# Patient Record
Sex: Female | Born: 1960 | Race: Black or African American | Hispanic: No | Marital: Single | Smoking: Former smoker
Health system: Southern US, Community
[De-identification: ages and names within clinical notes are randomized; demographics above are authoritative.]

## PROBLEM LIST (undated history)

## (undated) DIAGNOSIS — I639 Cerebral infarction, unspecified: Secondary | ICD-10-CM

## (undated) DIAGNOSIS — E559 Vitamin D deficiency, unspecified: Secondary | ICD-10-CM

## (undated) DIAGNOSIS — R600 Localized edema: Secondary | ICD-10-CM

## (undated) DIAGNOSIS — I10 Essential (primary) hypertension: Secondary | ICD-10-CM

## (undated) DIAGNOSIS — N189 Chronic kidney disease, unspecified: Secondary | ICD-10-CM

## (undated) DIAGNOSIS — H547 Unspecified visual loss: Secondary | ICD-10-CM

## (undated) HISTORY — DX: Unspecified visual loss: H54.7

## (undated) HISTORY — PX: EYE SURGERY: SHX253

## (undated) HISTORY — DX: Vitamin D deficiency, unspecified: E55.9

## (undated) HISTORY — PX: TUBAL LIGATION: SHX77

## (undated) HISTORY — DX: Localized edema: R60.0

## (undated) HISTORY — PX: CHOLECYSTECTOMY: SHX55

## (undated) HISTORY — DX: Cerebral infarction, unspecified: I63.9

## (undated) HISTORY — DX: Essential (primary) hypertension: I10

## (undated) HISTORY — DX: Chronic kidney disease, unspecified: N18.9

---

## 2015-12-26 ENCOUNTER — Inpatient Hospital Stay (HOSPITAL_COMMUNITY)
Admission: RE | Admit: 2015-12-26 | Discharge: 2015-12-28 | DRG: 305 | Disposition: A | Discharge status: Home / Self Care | Payer: PRIVATE HEALTH INSURANCE | Attending: Internal Medicine | Admitting: Internal Medicine

## 2015-12-26 ENCOUNTER — Inpatient Hospital Stay (HOSPITAL_COMMUNITY)
Admission: AD | Admit: 2015-12-26 | Payer: Self-pay | Source: Other Acute Inpatient Hospital | Admitting: Internal Medicine

## 2015-12-26 DIAGNOSIS — H269 Unspecified cataract: Secondary | ICD-10-CM | POA: Diagnosis present

## 2015-12-26 DIAGNOSIS — Z9114 Patient's other noncompliance with medication regimen: Secondary | ICD-10-CM

## 2015-12-26 DIAGNOSIS — I161 Hypertensive emergency: Principal | ICD-10-CM | POA: Diagnosis present

## 2015-12-26 DIAGNOSIS — H348192 Central retinal vein occlusion, unspecified eye, stable: Secondary | ICD-10-CM | POA: Insufficient documentation

## 2015-12-26 DIAGNOSIS — H4089 Other specified glaucoma: Secondary | ICD-10-CM | POA: Diagnosis present

## 2015-12-26 DIAGNOSIS — I10 Essential (primary) hypertension: Secondary | ICD-10-CM | POA: Diagnosis present

## 2015-12-26 DIAGNOSIS — N289 Disorder of kidney and ureter, unspecified: Secondary | ICD-10-CM | POA: Diagnosis present

## 2015-12-26 DIAGNOSIS — Z87891 Personal history of nicotine dependence: Secondary | ICD-10-CM

## 2015-12-26 DIAGNOSIS — N179 Acute kidney failure, unspecified: Secondary | ICD-10-CM | POA: Diagnosis present

## 2015-12-26 DIAGNOSIS — E876 Hypokalemia: Secondary | ICD-10-CM | POA: Diagnosis present

## 2015-12-26 DIAGNOSIS — H348122 Central retinal vein occlusion, left eye, stable: Secondary | ICD-10-CM | POA: Diagnosis present

## 2015-12-26 LAB — CBC
HCT: 37.6 % (ref 36.0–46.0)
Hemoglobin: 12.6 g/dL (ref 12.0–15.0)
MCH: 28.9 pg (ref 26.0–34.0)
MCHC: 33.5 g/dL (ref 30.0–36.0)
MCV: 86.2 fL (ref 78.0–100.0)
Platelets: 242 10*3/uL (ref 150–400)
RBC: 4.36 MIL/uL (ref 3.87–5.11)
RDW: 14.7 % (ref 11.5–15.5)
WBC: 8.4 10*3/uL (ref 4.0–10.5)

## 2015-12-26 LAB — PROTIME-INR
INR: 1.1 (ref 0.00–1.49)
Prothrombin Time: 14.4 seconds (ref 11.6–15.2)

## 2015-12-26 LAB — COMPREHENSIVE METABOLIC PANEL
ALT: 16 U/L (ref 14–54)
AST: 23 U/L (ref 15–41)
Albumin: 3.9 g/dL (ref 3.5–5.0)
Alkaline Phosphatase: 46 U/L (ref 38–126)
Anion gap: 7 (ref 5–15)
BUN: 12 mg/dL (ref 6–20)
CO2: 19 mmol/L — ABNORMAL LOW (ref 22–32)
Calcium: 9.3 mg/dL (ref 8.9–10.3)
Chloride: 108 mmol/L (ref 101–111)
Creatinine, Ser: 1.85 mg/dL — ABNORMAL HIGH (ref 0.44–1.00)
GFR calc Af Amer: 34 mL/min — ABNORMAL LOW (ref >60)
GFR calc non Af Amer: 29 mL/min — ABNORMAL LOW (ref >60)
Glucose, Bld: 131 mg/dL — ABNORMAL HIGH (ref 65–99)
Potassium: 3.3 mmol/L — ABNORMAL LOW (ref 3.5–5.1)
Sodium: 134 mmol/L — ABNORMAL LOW (ref 135–145)
Total Bilirubin: 0.7 mg/dL (ref 0.3–1.2)
Total Protein: 7.3 g/dL (ref 6.5–8.1)

## 2015-12-26 LAB — GLUCOSE, CAPILLARY
Glucose-Capillary: 124 mg/dL — ABNORMAL HIGH (ref 65–99)
Glucose-Capillary: 128 mg/dL — ABNORMAL HIGH (ref 65–99)

## 2015-12-26 LAB — MAGNESIUM: Magnesium: 2.3 mg/dL (ref 1.7–2.4)

## 2015-12-26 LAB — MRSA PCR SCREENING: MRSA by PCR: NEGATIVE

## 2015-12-26 LAB — APTT: aPTT: 25 seconds (ref 24–37)

## 2015-12-26 LAB — PHOSPHORUS: Phosphorus: 2.8 mg/dL (ref 2.5–4.6)

## 2015-12-26 MED ORDER — BRIMONIDINE TARTRATE 0.2 % OP SOLN
1.0000 [drp] | Freq: Three times a day (TID) | OPHTHALMIC | Status: DC
  Administered 2015-12-26 – 2015-12-28 (×5): 1 [drp] via OPHTHALMIC
  Filled 2015-12-26: qty 5

## 2015-12-26 MED ORDER — SODIUM CHLORIDE 0.9 % IV SOLN: 250.0000 mL | INTRAVENOUS | Status: DC | PRN

## 2015-12-26 MED ORDER — TIMOLOL MALEATE 0.5 % OP SOLN
1.0000 [drp] | Freq: Two times a day (BID) | OPHTHALMIC | Status: DC
  Administered 2015-12-26 – 2015-12-28 (×4): 1 [drp] via OPHTHALMIC
  Filled 2015-12-26: qty 5

## 2015-12-26 MED ORDER — NICARDIPINE HCL IN NACL 20-0.86 MG/200ML-% IV SOLN
3.0000 mg/h | INTRAVENOUS | Status: DC
  Administered 2015-12-26: 5 mg/h via INTRAVENOUS
  Administered 2015-12-26: 3 mg/h via INTRAVENOUS
  Filled 2015-12-26 (×2): qty 200

## 2015-12-26 MED ORDER — POTASSIUM CHLORIDE CRYS ER 20 MEQ PO TBCR
30.0000 meq | EXTENDED_RELEASE_TABLET | Freq: Once | ORAL | Status: AC
  Administered 2015-12-27: 30 meq via ORAL
  Filled 2015-12-26: qty 1

## 2015-12-26 MED ORDER — LABETALOL HCL 200 MG PO TABS
200.0000 mg | ORAL_TABLET | Freq: Two times a day (BID) | ORAL | Status: DC
  Administered 2015-12-27 – 2015-12-28 (×3): 200 mg via ORAL
  Filled 2015-12-26 (×4): qty 1

## 2015-12-26 MED ORDER — DORZOLAMIDE HCL 2 % OP SOLN
1.0000 [drp] | Freq: Three times a day (TID) | OPHTHALMIC | Status: DC
  Administered 2015-12-26 – 2015-12-28 (×5): 1 [drp] via OPHTHALMIC
  Filled 2015-12-26: qty 10

## 2015-12-26 MED ORDER — AMLODIPINE BESYLATE 10 MG PO TABS
10.0000 mg | ORAL_TABLET | Freq: Every day | ORAL | Status: DC
  Administered 2015-12-27 – 2015-12-28 (×2): 10 mg via ORAL
  Filled 2015-12-26 (×2): qty 1

## 2015-12-26 MED ORDER — SODIUM CHLORIDE 0.9 % IV SOLN
INTRAVENOUS | Status: DC
  Administered 2015-12-26: 17:00:00 via INTRAVENOUS

## 2015-12-26 MED ORDER — HEPARIN SODIUM (PORCINE) 5000 UNIT/ML IJ SOLN
5000.0000 [IU] | Freq: Three times a day (TID) | INTRAMUSCULAR | Status: DC
  Administered 2015-12-26 – 2015-12-28 (×5): 5000 [IU] via SUBCUTANEOUS
  Filled 2015-12-26 (×5): qty 1

## 2015-12-26 NOTE — Consult Note (Signed)
CC: HTN emergency  HPI: Valerie Coleman is a 55 y.o. female w/o significant POH and PMH of poorly controlled HTN below who presents for evaluation of HTN urgency w/ IOP 50 OS. Patient reports a few months of decreased vision OS and then 1-2 weeks ago the eye became red and painful. Vision OD stable. Only uses reading glasses. Hasn't seen an eye doctor since a kid.   ROS: + decreased vision OS, + redness OS, + pain OS  PMH: HTN  PSH: Unknown  Meds: No current facility-administered medications on file prior to encounter.   No current outpatient prescriptions on file prior to encounter.    SH: Social History   Social History  . Marital Status: Single    Spouse Name: N/A  . Number of Children: N/A  . Years of Education: N/A   Social History Main Topics  . Smoking status: Not on file  . Smokeless tobacco: Not on file  . Alcohol Use: Not on file  . Drug Use: Not on file  . Sexual Activity: Not on file   Other Topics Concern  . Not on file   Social History Narrative  . No narrative on file    FH: No family history on file.  Exam:  Lucianne Lei: OD: 26 pt + 2.75 OS: LP  CVF: OD: full OS: LP  EOM: OD: full d/v OS: full d/v  Pupils: OD: 3->2 mm, no APD OS: 4 mm fixed w/ + APD  IOP: by [place] OD: 17 OS: 50  External: OD: no periorbital edema, no proptosis, V1-V3 intact and symmetric, good orbicularis strength OS: no periorbital edema, no proptosis, V1-V3 intact and symmetric, good orbicularis strength  Pen Light Exam: L/L: OD: WNL OS: WNL  C/S: OD: white and quiet OS: 3+ injection  K: OD: clear, no abnormal staining OS: stromal haze  A/C: OD: formed but difficult to assess if deep OS: formed but difficult to assess if deep  I: OD: round and regular OS: irregular and mid dilation w/ + NVI  L: OD: NSC OS: NSC  DFE: UNABLE TO SAFELY DILATED - UNABLE TO ASSESS ANGLE OD THUS DECLINED DILATION FOR SAFETY  V: OD: UNABLE due to miosis OS:  clear  N: OD: UNABLE due to miosis OS: obscured by heme - likely NVD  M: OD: UNABLE due to miosis OS: poor view due to cornea - appears DBH  V: OD: UNABLE due to miosis OS: poor view  P: OD: UNABLE due to miosis OS: poor view - periphery w/ [facility]  A/P:  1. Neovascular Glaucoma Left Eye: - Discussed likely developed CRVO initially which resulted in ischemic retina w/ neovascularization and resulting development of NVG - IOP 50 and unlikely to resolve on drops and medications alone - only treatment would be surgery - Discussed that given currently w/ HTN urgency would not be medically stable enough for surgery even if under MAC - Discussed that goal of surgery would be to get IOP back down to normal pressures but unlikely to improve vision and that a good outcome would be preserving LP to CF/HM vision - Discussed that without intervention likely will progress to NLP and unable to get that vision back - Recommend start the following glaucoma drops: Brimonidine TID OS, Timolol BID OS, Dorzolamide TID OS - If medically cleared for surgery and patient decides to pursue surgery please call me at [phone] to discuss scheduling surgery while inpatient at [facility] - If patient decides not to pursue surgery  would recommend outpatient follow-up as soon as she gets discharged - call office at [phone]   2. 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 Cataracts OU: - Evaluated outpatient  Dispo: - Will sign off for now - if decides wants surgery please call me to coordinate inpatient surgery otherwise immediate outpatient follow-up at [facility]. Manuella Ghazi, [place] Office: [phone]

## 2015-12-26 NOTE — H&P (Signed)
PULMONARY / CRITICAL CARE MEDICINE   Name: Valerie Coleman MRN: [id number] DOB: 10-Aug-1960    ADMISSION DATE:  12/26/2015  CHIEF COMPLAINT:  Hypertensive emergency  HISTORY OF PRESENT ILLNESS:   55 yo AAF with h/o poorly controlled HTN (she self d/ced medications years ago).  She presented to [facility] for 1 week of left eye blurry vision.  She does not recall any inciting events or trauma, she had no other symptoms at that time.  4 days ago she developed some pain in the left eye.  This morning she noted her left eye was very painful and red with total vision loss other than just "light".  At the OSH she was found to have SBP 280's, she was transferred to [facility] for further evaluation.   Smoked for 1 year when she was 17 EtOH 2 beers per week until 6 months ago, now stopped. No drugs.  PAST MEDICAL HISTORY :   has no past medical history on file.  has no past surgical history on file. Prior to Admission medications   Not on File   No Known Allergies  FAMILY HISTORY:  has no family status information on file.  SOCIAL HISTORY:    REVIEW OF SYSTEMS:   Negative for: Fevers, chills, chest pain, SOB, DOE, hot/cold intolerance, swelling of the feet or hands, weakness, loss of consciousness, and symptoms in the right eye, no slurring of speech or dysphagia. Positive for loss of vision in left eye, eye pain, redness  SUBJECTIVE:   VITAL SIGNS: Temp:  [98.3 F (36.8 C)-98.8 F (37.1 C)] 98.3 F (36.8 C) (07/16 1956) Pulse Rate:  [89-94] 94 (07/16 1845) Resp:  [17-24] 24 (07/16 1845) BP: (158-200)/(99-116) 158/101 mmHg (07/16 1845) SpO2:  [99 %-100 %] 99 % (07/16 1845) Weight:  [72.3 kg (159 lb 6.3 oz)] 72.3 kg (159 lb 6.3 oz) (07/16 1730) HEMODYNAMICS:   VENTILATOR SETTINGS:   INTAKE / OUTPUT:  Intake/Output Summary (Last 24 hours) at 12/26/15 2139 Last data filed at 12/26/15 1800  Gross per 24 hour  Intake    8.5 ml  Output    200 ml  Net -191.5 ml    PHYSICAL  EXAMINATION: General:  NAD, AAO x3 Neuro:  CN III-XII intact.  Strength equal all extremities.  Left lower quadrantopia and left partial hemianopsia.  HEENT:  NCAT, Left conjuctivitis Cardiovascular:  Hypertensive, RRR, 99991111 + early systolic murmur across precordium.  Lungs:  CTA b/l no w/r/r, normal fremitus, no dullness to percussion. Abdomen:  Soft, non tender, normal bowel sounds Musculoskeletal:  Normal bulk and tone Skin:  No c/c/e  LABS:  CBC No results for input(s): WBC, HGB, HCT, PLT in the last 168 hours. Coag's No results for input(s): APTT, INR in the last 168 hours. BMET No results for input(s): NA, K, CL, CO2, BUN, CREATININE, GLUCOSE in the last 168 hours. Electrolytes No results for input(s): CALCIUM, MG, PHOS in the last 168 hours. Sepsis Markers No results for input(s): LATICACIDVEN, PROCALCITON, O2SATVEN in the last 168 hours. ABG No results for input(s): PHART, PCO2ART, PO2ART in the last 168 hours. Liver Enzymes No results for input(s): AST, ALT, ALKPHOS, BILITOT, ALBUMIN in the last 168 hours. Cardiac Enzymes No results for input(s): TROPONINI, PROBNP in the last 168 hours. Glucose  Recent Labs Lab 12/26/15 1728 12/26/15 1950  GLUCAP 124* 128*    Imaging No results found.   ASSESSMENT / PLAN:  55yo AAF with hypertensive emergency and resulting neovascular glaucoma and loss of vision  in left eye.  PULMONARY  A: Not active  CARDIOVASCULAR A:  Hypertensive emergency- 2/2 med noncompliance Systolic murmur  P:  - Nicardipine gtt - Goal SBP 180-200 x 12 hours - Start oral amlodipine 10mg  PO Qday tomorrow - Labetalol 200mg  PO BID first dose tonight - wean gtt   OCCULAR: A:  Neovascular Glaucoma CRVO Left Eye Cataracts  P: -discussed risk benefit with the patient and explained that given her blood pressure she would be at significant risk for operative complications and given the best case scenario per Optho is resoration of light  perception.  She is likely not medically stable for this procedure.  As her other organ systems are currently intact without e/o damage, the risk likely outweighs the benefit.  She agrees with this plan. - further plan per OPTHO  RENAL A:   AKI - unknown baseline but likely 2/2 HTN emergency P:   - Correct BP as above - Avoid nephrotoxins - Monitor Scr  GASTROINTESTINAL A:   Not active   HEMATOLOGIC A:  Note active P:    INFECTIOUS A:   Not active P:     ENDOCRINE A:   Hyperglycemia without h/o DM   P:   - monitor for now - BG goal <180  NEUROLOGIC A:   Not active P:    FAMILY  - Updates: Updated patient at bedside today   Total critical care time: 30 min  Critical care time was exclusive of separately billable procedures and treating other patients.  Critical care was necessary to treat or prevent imminent or life-threatening deterioration.  Critical care was time spent personally by me on the following activities: development of treatment plan with patient and/or surrogate as well as nursing, discussions with consultants, evaluation of patient's response to treatment, examination of patient, obtaining history from patient or surrogate, ordering and performing treatments and interventions, ordering and review of laboratory studies, ordering and review of radiographic studies, pulse oximetry and re-evaluation of patient's condition.   Meribeth Mattes, DO., MS [place] [facility]   Pulmonary and [place] Pager: [phone]  12/26/2015, 9:39 PM

## 2015-12-26 NOTE — Progress Notes (Signed)
[place] Progress Note Patient Name: Larraine Langworthy DOB: 1960-11-29 MRN: [id number]   Date of Service  12/26/2015  HPI/Events of Note    eICU Interventions  Hypokalemia, repleted      Intervention Category Minor Interventions: Electrolytes abnormality - evaluation and management  Simonne Maffucci 12/26/2015, 11:54 PM

## 2015-12-26 NOTE — Progress Notes (Signed)
   12/26/15 1852  Clinical Encounter Type  Visited With Patient  Visit Type Initial;Spiritual support  Referral From Nurse  Spiritual Encounters  Spiritual Needs Prayer;Emotional  Stress Factors  Patient Stress Factors Health changes  Chaplin responded to page.  Chaplain visited patient regarding AD and loss of vision in eye due to blood pressure.  Patient also asked for guided to accept [employer] as her Valerie Coleman and Ambulance person.  Chaplain assisted her in doing so.  Chaplain informed patient of availalibilty of notary to complete AD on Monday.  Chaplain made further support available if needed.

## 2015-12-27 DIAGNOSIS — H348192 Central retinal vein occlusion, unspecified eye, stable: Secondary | ICD-10-CM

## 2015-12-27 DIAGNOSIS — I161 Hypertensive emergency: Principal | ICD-10-CM

## 2015-12-27 LAB — BASIC METABOLIC PANEL
Anion gap: 8 (ref 5–15)
BUN: 13 mg/dL (ref 6–20)
CO2: 19 mmol/L — ABNORMAL LOW (ref 22–32)
Calcium: 8.9 mg/dL (ref 8.9–10.3)
Chloride: 107 mmol/L (ref 101–111)
Creatinine, Ser: 1.92 mg/dL — ABNORMAL HIGH (ref 0.44–1.00)
GFR calc Af Amer: 32 mL/min — ABNORMAL LOW (ref >60)
GFR calc non Af Amer: 28 mL/min — ABNORMAL LOW (ref >60)
Glucose, Bld: 91 mg/dL (ref 65–99)
Potassium: 3.5 mmol/L (ref 3.5–5.1)
Sodium: 134 mmol/L — ABNORMAL LOW (ref 135–145)

## 2015-12-27 LAB — CBC
HCT: 35.7 % — ABNORMAL LOW (ref 36.0–46.0)
Hemoglobin: 11.6 g/dL — ABNORMAL LOW (ref 12.0–15.0)
MCH: 28.1 pg (ref 26.0–34.0)
MCHC: 32.5 g/dL (ref 30.0–36.0)
MCV: 86.4 fL (ref 78.0–100.0)
Platelets: 232 10*3/uL (ref 150–400)
RBC: 4.13 MIL/uL (ref 3.87–5.11)
RDW: 14.8 % (ref 11.5–15.5)
WBC: 7.3 10*3/uL (ref 4.0–10.5)

## 2015-12-27 MED ORDER — ACETAMINOPHEN 325 MG PO TABS
650.0000 mg | ORAL_TABLET | Freq: Four times a day (QID) | ORAL | Status: DC | PRN
  Administered 2015-12-27 – 2015-12-28 (×4): 650 mg via ORAL
  Filled 2015-12-27 (×5): qty 2

## 2015-12-27 MED ORDER — HYDRALAZINE HCL 20 MG/ML IJ SOLN: 5.0000 mg | INTRAMUSCULAR | Status: DC | PRN

## 2015-12-27 MED FILL — Hydralazine HCl Inj 20 MG/ML: INTRAMUSCULAR | Qty: 1 | Status: AC

## 2015-12-27 NOTE — Progress Notes (Signed)
Nutrition Brief Note  Patient identified on the Malnutrition Screening Tool (MST) Report. Patient reports some weight loss recently due to worrying about her eye. She reports that appetite and intake have improved since admission.  Nutrition focused physical exam completed.  No muscle or subcutaneous fat depletion noticed.  Wt Readings from Last 15 Encounters:  12/27/15 159 lb 6.3 oz (72.3 kg)    Body mass index is 21.61 kg/(m^2). Patient meets criteria for normal weight based on current BMI.   Current diet order is 2 gm sodium, patient is consuming approximately 100% of meals at this time. Labs and medications reviewed.   No nutrition interventions warranted at this time. If nutrition issues arise, please consult RD.   Molli Barrows, RD, LDN, [place] Pager [phone] After Hours Pager [phone]

## 2015-12-27 NOTE — Progress Notes (Signed)
Spoke with Dr. Manuella Ghazi (Ophthalmology) on the phone regarding possible eye surgery. Dr. Manuella Ghazi states that he does not expect that Valerie Coleman would have a great surgical outcome. He thinks that the best possible outcome would be for her to retain light perception. Her vision will never go back to where it was before this hospitalization. She would likely end up needing multiple surgeries for minimal benefit. He is willing to do surgery if that is what she would like, but he does not think surgical management would have much benefit at this point.  I discussed surgery with Valerie Coleman and her daughter. Both the patient and her daughter agree that they do not want to pursue surgical management at this point. They do not feel that undergoing multiple surgeries is worth the outcome.   - Will contact Dr. Manuella Ghazi and let him know that Pt is not interested in surgery - Pt needs follow-up in Dr. Trena Platt office immediately after discharge   Hyman Bible, MD PGY-2

## 2015-12-27 NOTE — Progress Notes (Signed)
PULMONARY / CRITICAL CARE MEDICINE   Name: Valerie Coleman MRN: [id number] DOB: 1961-03-01    ADMISSION DATE:  12/26/2015 CONSULTATION DATE:  12/26/2015  CHIEF COMPLAINT:  Hypertensive Emergency  HISTORY OF PRESENT ILLNESS:   55 yo AAF with h/o poorly controlled HTN (she self d/ced medications years ago). She presented to [facility] for 1 week of left eye blurry vision. She does not recall any inciting events or trauma, she had no other symptoms at that time. 4 days ago she developed some pain in the left eye. This morning she noted her left eye was very painful and red with total vision loss other than just "light". At the OSH she was found to have SBP 280's, she was transferred to [facility] for further evaluation.   Smoked for 1 year when she was 17 EtOH 2 beers per week until 6 months ago, now stopped. No drugs.  PAST MEDICAL HISTORY :  She  has no past medical history on file.  PAST SURGICAL HISTORY: She  has no past surgical history on file.  No Known Allergies  No current facility-administered medications on file prior to encounter.   No current outpatient prescriptions on file prior to encounter.    FAMILY HISTORY:  Her has no family status information on file.   SOCIAL HISTORY: She  smoked for 1 year when she was 38. She drank 2 beers per week until 6 months ago, no drugs.  REVIEW OF SYSTEMS:   Endorses headaches, blurring of L eye.  SUBJECTIVE:  Pt states she is having a headache that is occurring all over her head. She says her left eye is blurry and she can only see light. She does not know if she wants to have surgery on her eye. She "needs to think on it".   VITAL SIGNS: BP 147/96 mmHg  Pulse 79  Temp(Src) 98.6 F (37 C) (Oral)  Resp 22  Ht 6' (1.829 m)  Wt 72.3 kg (159 lb 6.3 oz)  BMI 21.61 kg/m2  SpO2 99%  HEMODYNAMICS:    VENTILATOR SETTINGS:    INTAKE / OUTPUT: I/O last 3 completed shifts: In: 315.8 [I.V.:315.8] Out: 750  [Urine:750]  PHYSICAL EXAMINATION: General: NAD, AAO x3 Neuro: CN 2-12 grossly intact, answers questions appropriately, follows commands HEENT: NCAT, Left conjuctivitis Cardiovascular: RRR, 2/6 systolic murmur Lungs: CTAB, normal work of breathing Abdomen: Soft, non tender, normal bowel sounds Musculoskeletal: Normal bulk and tone Skin: No rashes or lesions  LABS:  BMET  Recent Labs Lab 12/26/15 2219 12/27/15 0230  NA 134* 134*  K 3.3* 3.5  CL 108 107  CO2 19* 19*  BUN 12 13  CREATININE 1.85* 1.92*  GLUCOSE 131* 91    Electrolytes  Recent Labs Lab 12/26/15 2219 12/27/15 0230  CALCIUM 9.3 8.9  MG 2.3  --   PHOS 2.8  --     CBC  Recent Labs Lab 12/26/15 2219 12/27/15 0230  WBC 8.4 7.3  HGB 12.6 11.6*  HCT 37.6 35.7*  PLT 242 232    Coag's  Recent Labs Lab 12/26/15 2219  APTT 25  INR 1.10    Sepsis Markers No results for input(s): LATICACIDVEN, PROCALCITON, O2SATVEN in the last 168 hours.  ABG No results for input(s): PHART, PCO2ART, PO2ART in the last 168 hours.  Liver Enzymes  Recent Labs Lab 12/26/15 2219  AST 23  ALT 16  ALKPHOS 46  BILITOT 0.7  ALBUMIN 3.9    Cardiac Enzymes No results for input(s): TROPONINI, PROBNP  in the last 168 hours.  Glucose  Recent Labs Lab 12/26/15 1728 12/26/15 1950  GLUCAP 124* 128*    Imaging No results found.   STUDIES:    CULTURES:   ANTIBIOTICS:   SIGNIFICANT EVENTS: 7/16 Admit  LINES/TUBES: PIV 7/16  DISCUSSION: 55yo AAF with hypertensive emergency and resulting neovascular glaucoma and loss of vision in left eye.  ASSESSMENT / PLAN:  PULMONARY  A: Not active  CARDIOVASCULAR A:  Hypertensive emergency- 2/2 med noncompliance- improving Systolic murmur P:  - s/p Nicardipine gtt. Now stable on PO meds. - Continue Norvasc 10mg  daily, Labetalol 200mg  bid, Hydralazine prn - Monitor hemodynamic status  OCCULAR: A:  Neovascular Glaucoma CRVO Left  Eye Cataracts P: - Will call Ophthalmology today to discuss possible surgery. - Continue Brimonidine drops, Dorzolamide drops, and Timolol drops per Ophtho   RENAL A:  AKI - unknown baseline but likely 2/2 HTN emergency P:  - Correct BP as above - Avoid nephrotoxins - Monitor Scr  GASTROINTESTINAL A:  Not active   HEMATOLOGIC A:  Note active P:    INFECTIOUS A:  Not active P:    ENDOCRINE A:  Hyperglycemia without h/o DM  P:  - monitor for now - BG goal <180   NEUROLOGIC A:  Not active  DISPO - BPs stable off Nicardipine drip - Transfer to telemetry today - [facility] to assume care 7/18   Hyman Bible, MD Family Medicine, PGY-2  12/27/2015, 1:48 PM

## 2015-12-27 NOTE — Progress Notes (Signed)
CSW consult acknowledged. CSW to consult RN Case Manager re: need of PCP.     Valerie Dyer, LCSW [facility] ED/15M Clinical Social Worker [phone]

## 2015-12-27 NOTE — Progress Notes (Signed)
NURSING PROGRESS NOTE  Rainn Imparato [id number] Transfer Data: 12/27/2015 3:34 PM Attending Provider: Roswell Nickel, MD PCP:No primary care provider on file. Code Status: DNR   Valerie Coleman is a 55 y.o. female patient transferred from 2 M  -No acute distress noted.  -No complaints of shortness of breath.  -No complaints of chest pain.   Cardiac Monitoring: Box # 09 in place. Cardiac monitor yields:normal sinus rhythm.  Last Documented Vital Signs: Blood pressure 167/93, pulse 74, temperature 98.4 F (36.9 C), temperature source Oral, resp. rate 20, height 6' (1.829 m), weight 72.3 kg (159 lb 6.3 oz), SpO2 100 %.  IV Fluids:  IV in place, occlusive dsg intact without redness, IV cath hand left, condition patent and no redness IV push only, no IV fluids.   Allergies:  Review of patient's allergies indicates no known allergies.  Past Medical History:   has no past medical history on file.  Past Surgical History:   has no past surgical history on file.  Social History:     Skin: intact except where otherwise charted  Patient/Family orientated to room. Information packet given to patient/family. Admission inpatient armband information verified with patient/family to include name and date of birth and placed on patient arm. Side rails up x 2, fall assessment and education completed with patient/family. Patient/family able to verbalize understanding of risk associated with falls and verbalized understanding to call for assistance before getting out of bed. Call light within reach. Patient/family able to voice and demonstrate understanding of unit orientation instructions.

## 2015-12-28 ENCOUNTER — Inpatient Hospital Stay (HOSPITAL_COMMUNITY): Payer: PRIVATE HEALTH INSURANCE

## 2015-12-28 DIAGNOSIS — R0600 Dyspnea, unspecified: Secondary | ICD-10-CM | POA: Diagnosis not present

## 2015-12-28 DIAGNOSIS — H348192 Central retinal vein occlusion, unspecified eye, stable: Secondary | ICD-10-CM | POA: Diagnosis not present

## 2015-12-28 DIAGNOSIS — I161 Hypertensive emergency: Secondary | ICD-10-CM | POA: Diagnosis not present

## 2015-12-28 LAB — ECHOCARDIOGRAM COMPLETE
Height: 72 in
Weight: 2593.6 oz

## 2015-12-28 MED ORDER — TIMOLOL MALEATE 0.5 % OP SOLN: 1.0000 [drp] | Freq: Two times a day (BID) | OPHTHALMIC | Status: DC

## 2015-12-28 MED ORDER — DORZOLAMIDE HCL 2 % OP SOLN: 1.0000 [drp] | Freq: Three times a day (TID) | OPHTHALMIC | Status: DC

## 2015-12-28 MED ORDER — LABETALOL HCL 200 MG PO TABS: 200.0000 mg | ORAL_TABLET | Freq: Two times a day (BID) | ORAL | Status: DC

## 2015-12-28 MED ORDER — BRIMONIDINE TARTRATE 0.2 % OP SOLN: 1.0000 [drp] | Freq: Three times a day (TID) | OPHTHALMIC | Status: DC

## 2015-12-28 MED ORDER — AMLODIPINE BESYLATE 10 MG PO TABS: 10.0000 mg | ORAL_TABLET | Freq: Every day | ORAL | Status: DC

## 2015-12-28 MED ORDER — SERTRALINE HCL 25 MG PO TABS: 25.0000 mg | ORAL_TABLET | Freq: Every day | ORAL | Status: DC

## 2015-12-28 MED FILL — BRIMONIDINE 0.2% EYE DROP: 0.2 | 25 days supply | Qty: 5 | Fill #0

## 2015-12-28 MED FILL — ?SERTRALINE HCL 25 MG TABLE: 25 | 30 days supply | Qty: 30 | Fill #0

## 2015-12-28 MED FILL — AMLODIPINE BESYLATE 10 MG T: 10 | 30 days supply | Qty: 30 | Fill #0

## 2015-12-28 MED FILL — LABETALOL HCL 200 MG TABLET: 200 | 30 days supply | Qty: 60 | Fill #0

## 2015-12-28 NOTE — Progress Notes (Signed)
Pt given discharge instructions, prescriptions, and care notes. Pt verbalized understanding AEB no further questions or concerns at this time. IV was discontinued, no redness, pain, or swelling noted at this time. Telemetry discontinued and Centralized Telemetry was notified. Pt left the floor via wheelchair with staff in stable condition. 

## 2015-12-28 NOTE — Care Management Note (Signed)
Case Management Note  Patient Details  Name: Valerie Coleman MRN: [id number] Date of Birth: 12/08/1960  Subjective/Objective:                    Action/Plan:  Plan is pt is for d/c today. Expected Discharge Date:     12/28/2015       Expected Discharge Plan:  Home/Self Care  In-House Referral:     Discharge planning Services  CM Consult, [facility], Follow-up appt scheduled (Post hospital follow up scheduled on 02/03/2016 at 9:30 am  with Sharon Seller NP, [facility] (overflow for [facility])), pt made aware per  CM and information given.  Post Acute Care Choice:    Choice offered to:     DME Arranged:    DME Agency:     HH Arranged:    HH Agency:     Status of Service:  Completed, signed off  If discussed at [place] of Stay Meetings, dates discussed:    Additional Comments;  Sharin Mons, RN 12/28/2015, 11:51 AM

## 2015-12-28 NOTE — Progress Notes (Signed)
CM faxed prescriptions to [facility] pharmacy. Instructed pt to pick prescriptions up from [facility] @ discharge , pt without insurance and states cant afford medication. Whitman Hero RN,BSN,CM [phone]

## 2015-12-28 NOTE — Discharge Summary (Addendum)
Physician Discharge Summary  Valerie Coleman [id number] DOB: 09-19-1960 DOA: 12/26/2015  PCP: No primary care provider on file.  Admit date: 12/26/2015 Discharge date: 12/28/2015  Time spent: 45 minutes  Recommendations for Outpatient Follow-up:  1. PCP in 1 week 2. [facility] in 1-2days, advised to call his office today for FU   Discharge Diagnoses:  Active Problems:   Hypertensive emergency   Central retinal vein occlusion   Neovascular glaucoma   Diastolic dysfunction  Discharge Condition: stable  Diet recommendation:low sodium heart healthy  Filed Weights   12/26/15 1730 12/27/15 0349 12/28/15 0543  Weight: 72.3 kg (159 lb 6.3 oz) 72.3 kg (159 lb 6.3 oz) 73.528 kg (162 lb 1.6 oz)    History of present illness:  55 yo AAF with h/o poorly controlled HTN (she self d/ced medications years ago). She presented to [facility] for 1 week of left eye blurry vision. She does not recall any inciting events or trauma, she had no other symptoms at that time. 4 days ago she developed some pain in the left eye. This morning she noted her left eye was very painful and red with total vision loss other than just "light". At the OSH she was found to have SBP 280's, she was transferred to [facility] for further evaluation.   Hospital Course:   1. Hypertensive emergency -She was admitted to the ICU by PCCM with blood pressures in the 260-280 range -Started on IV nicardipine drip and then subsequently transitioned to oral antihypertensives. -She was transferred to the medical service today from critical care service in ICU yesterday -Now SBP in the 160 range on oral amlodipine and labetalol  2. Renal insufficiency -Likely chronic from uncontrolled hypertension -Creatinine in the 1.8-1.9 range  3. Neovascular glaucoma and central retinal vein occlusion -Ophthalmology was consulted she was seen by Dr. Tama High, Dr. Manuella Ghazi discussed surgery with the patient, recommended  starting brimonidine, atenolol, dorzolamide eyedrops. Surgery was discussed again while she was in the ICU with patient and her daughter by the critical care team, patient decided to not pursue surgery at this time and hence was advised to follow-up immediately after discharge with Dr. Manuella Ghazi.  Consultations:  PCCM  Ophthalmology Dr.Shah  Discharge Exam: Filed Vitals:   12/28/15 0544 12/28/15 0941  BP: 167/89 182/99  Pulse: 62 67  Temp: 98.2 F (36.8 C)   Resp: 16     General: AAOx3 Cardiovascular: S1S2/RRR Respiratory: CTAB  Discharge Instructions   Discharge Instructions    Diet - low sodium heart healthy    Complete by:  As directed      Increase activity slowly    Complete by:  As directed           Current Discharge Medication List    START taking these medications   Details  amLODipine (NORVASC) 10 MG tablet Take 1 tablet (10 mg total) by mouth daily. Qty: 30 tablet, Refills: 0    brimonidine (ALPHAGAN) 0.2 % ophthalmic solution Place 1 drop into the left eye 3 (three) times daily. Qty: 5 mL, Refills: 1    dorzolamide (TRUSOPT) 2 % ophthalmic solution Place 1 drop into the left eye 3 (three) times daily. Qty: 10 mL, Refills: 1    labetalol (NORMODYNE) 200 MG tablet Take 1 tablet (200 mg total) by mouth 2 (two) times daily. Qty: 60 tablet, Refills: 0    sertraline (ZOLOFT) 25 MG tablet Take 1 tablet (25 mg total) by mouth daily. Qty: 30 tablet, Refills: 0  timolol (TIMOPTIC) 0.5 % ophthalmic solution Place 1 drop into the left eye 2 (two) times daily. Qty: 10 mL, Refills: 1       No Known Allergies Follow-up Information    Follow up with [name], MD.   Specialty:  Ophthalmology   Why:  Please call to schedule appointment with eye doctor ASAP after leaving hospital   Contact information:   [place] [phone] [phone]        The results of significant diagnostics from this hospitalization (including  imaging, microbiology, ancillary and laboratory) are listed below for reference.    Significant Diagnostic Studies: No results found.  Microbiology: Recent Results (from the past 240 hour(s))  MRSA PCR Screening     Status: None   Collection Time: 12/26/15  6:16 PM  Result Value Ref Range Status   MRSA by PCR NEGATIVE NEGATIVE Final    Comment:        The GeneXpert MRSA Assay (FDA approved for NASAL specimens only), is one component of a comprehensive MRSA colonization surveillance program. It is not intended to diagnose MRSA infection nor to guide or monitor treatment for MRSA infections.      Labs: Basic Metabolic Panel:  Recent Labs Lab 12/26/15 2219 12/27/15 0230  NA 134* 134*  K 3.3* 3.5  CL 108 107  CO2 19* 19*  GLUCOSE 131* 91  BUN 12 13  CREATININE 1.85* 1.92*  CALCIUM 9.3 8.9  MG 2.3  --   PHOS 2.8  --    Liver Function Tests:  Recent Labs Lab 12/26/15 2219  AST 23  ALT 16  ALKPHOS 46  BILITOT 0.7  PROT 7.3  ALBUMIN 3.9   No results for input(s): LIPASE, AMYLASE in the last 168 hours. No results for input(s): AMMONIA in the last 168 hours. CBC:  Recent Labs Lab 12/26/15 2219 12/27/15 0230  WBC 8.4 7.3  HGB 12.6 11.6*  HCT 37.6 35.7*  MCV 86.2 86.4  PLT 242 232   Cardiac Enzymes: No results for input(s): CKTOTAL, CKMB, CKMBINDEX, TROPONINI in the last 168 hours. BNP: BNP (last 3 results) No results for input(s): BNP in the last 8760 hours.  ProBNP (last 3 results) No results for input(s): PROBNP in the last 8760 hours.  CBG:  Recent Labs Lab 12/26/15 1728 12/26/15 1950  GLUCAP 124* 128*       SignedDomenic Polite MD.  [facility] 12/28/2015, 10:47 AM

## 2015-12-28 NOTE — Progress Notes (Signed)
  Echocardiogram 2D Echocardiogram has been performed.  Diamond Nickel 12/28/2015, 9:16 AM

## 2016-02-03 ENCOUNTER — Encounter: Payer: Self-pay | Admitting: Family Medicine

## 2016-02-03 ENCOUNTER — Ambulatory Visit (INDEPENDENT_AMBULATORY_CARE_PROVIDER_SITE_OTHER): Payer: PRIVATE HEALTH INSURANCE | Admitting: Family Medicine

## 2016-02-03 VITALS — BP 173/96 | HR 78 | Temp 97.9°F | Resp 14 | Ht 72.0 in | Wt 151.0 lb

## 2016-02-03 DIAGNOSIS — H5442 Blindness, left eye, normal vision right eye: Secondary | ICD-10-CM

## 2016-02-03 DIAGNOSIS — H5440 Blindness, one eye, unspecified eye: Secondary | ICD-10-CM

## 2016-02-03 DIAGNOSIS — I10 Essential (primary) hypertension: Secondary | ICD-10-CM | POA: Diagnosis not present

## 2016-02-03 LAB — COMPLETE METABOLIC PANEL WITH GFR
ALT: 7 U/L (ref 6–29)
AST: 11 U/L (ref 10–35)
Albumin: 4.1 g/dL (ref 3.6–5.1)
Alkaline Phosphatase: 46 U/L (ref 33–130)
BUN: 13 mg/dL (ref 7–25)
CO2: 21 mmol/L (ref 20–31)
Calcium: 9.8 mg/dL (ref 8.6–10.4)
Chloride: 104 mmol/L (ref 98–110)
Creat: 1.66 mg/dL — ABNORMAL HIGH (ref 0.50–1.05)
GFR, Est African American: 39 mL/min — ABNORMAL LOW (ref >=60)
GFR, Est Non African American: 34 mL/min — ABNORMAL LOW (ref >=60)
Glucose, Bld: 79 mg/dL (ref 65–99)
Potassium: 3.6 mmol/L (ref 3.5–5.3)
Sodium: 140 mmol/L (ref 135–146)
Total Bilirubin: 0.3 mg/dL (ref 0.2–1.2)
Total Protein: 7.1 g/dL (ref 6.1–8.1)

## 2016-02-03 MED ORDER — AMLODIPINE BESYLATE 10 MG PO TABS: 10.0000 mg | ORAL_TABLET | Freq: Every day | ORAL | 1 refills | Status: DC

## 2016-02-03 MED ORDER — LISINOPRIL 20 MG PO TABS: 20.0000 mg | ORAL_TABLET | Freq: Every day | ORAL | 3 refills | Status: DC

## 2016-02-03 MED ORDER — LABETALOL HCL 200 MG PO TABS
200.0000 mg | ORAL_TABLET | Freq: Two times a day (BID) | ORAL | 3 refills | Status: DC
Start: 2016-02-03 — End: 2016-03-21

## 2016-02-03 NOTE — Patient Instructions (Signed)
Try to eat more Follow-up with me in 3 months, sooner if you get insurance. At your request we are posponing some health maintenance items.

## 2016-02-03 NOTE — Progress Notes (Signed)
Valerie Coleman, is a 55 y.o. female  [id number]  [id number]  DOB - 02-14-1961  CC:  Chief Complaint  Patient presents with  . Establish Care  . Hypertension       HPI: Valerie Coleman is a 55 y.o. female here to establish care. She has a long history of hypertension but discontinued her medications years ago. She was seen and hospitalized recently for hypertensive emergency with BPS of 180+. She was also having visual issues with her left eye and was discovered to have Central Retinal Vein Occlusion with resultant blindness of left eye. She is being followed by Dr. Manuella Ghazi.She currently does not have any insurance and would like to address her blood pressure but delay any other health maintenance items. She in on amlodipine 10 daily and labetalol 200 bid. Her BP today is 173/96.  Health Maintenance: Needs Tdap and influenza but defers today. She needs A1C to screen for diabetes, Hep C screening. She needs mammogram and PAP. She will return when her insurance is effective.   No Known Allergies Past Medical History:  Diagnosis Date  . Hypertension    Current Outpatient Prescriptions on File Prior to Visit  Medication Sig Dispense Refill  . brimonidine (ALPHAGAN) 0.2 % ophthalmic solution Place 1 drop into the left eye 3 (three) times daily. (Patient not taking: Reported on 02/03/2016) 5 mL 1  . dorzolamide (TRUSOPT) 2 % ophthalmic solution Place 1 drop into the left eye 3 (three) times daily. (Patient not taking: Reported on 02/03/2016) 10 mL 1  . sertraline (ZOLOFT) 25 MG tablet Take 1 tablet (25 mg total) by mouth daily. (Patient not taking: Reported on 02/03/2016) 30 tablet 0  . timolol (TIMOPTIC) 0.5 % ophthalmic solution Place 1 drop into the left eye 2 (two) times daily. (Patient not taking: Reported on 02/03/2016) 10 mL 1   No current facility-administered medications on file prior to visit.    History reviewed. No pertinent family history. Social History   Social History  .  Marital status: Single    Spouse name: N/A  . Number of children: N/A  . Years of education: N/A   Occupational History  . Not on file.   Social History Main Topics  . Smoking status: Former Research scientist (life sciences)  . Smokeless tobacco: Never Used  . Alcohol use No  . Drug use: No  . Sexual activity: Not on file   Other Topics Concern  . Not on file   Social History Narrative  . No narrative on file    Review of Systems: Constitutional: Negative for fever, chills, fatigue. Positive for fluctuation in appetite due to the stress and a 10# weight loss Skin: Negative for rashes or lesions of concern. HENT: Negative for ear pain, ear discharge.nose bleeds Eyes: Positive for pain, redness and blindness in left eye. Neck: Negative for pain, stiffness Respiratory: Negative for cough, shortness of breath,   Cardiovascular: Negative for chest pain, palpitations and leg swelling. Gastrointestinal: Negative for abdominal pain, nausea, vomiting, diarrhea, constipations Genitourinary: Negative for dysuria, urgency, frequency, hematuria,  Musculoskeletal: Negative for back pain, joint pain, joint  swelling, and gait problem.Negative for weakness. Neurological: Negative for dizziness, tremors, seizures, syncope,   light-headedness, numbness and headaches.  Hematological: Negative for easy bruising or bleeding Psychiatric/Behavioral: Negative for depression, decreased concentration, confusion. Positive for anxiety but has declines to take Zoloft which was prescribed for her   Objective:   Vitals:   02/03/16 0918  BP: (!) 173/96  Pulse: 78  Resp: 14  Temp: 97.9 F (36.6 C)    Physical Exam: Constitutional: Patient appears well-developed and well-nourished. No distress. HENT: Normocephalic, atraumatic, External right and left ear normal. Oropharynx is clear and moist.  Eyes: Right eye appears normal. Left eye is reddened and non-focused. Neck: Normal ROM. Neck supple. No lymphadenopathy, No  thyromegaly. CVS: RRR, S1/S2 +, no murmurs, no gallops, no rubs Pulmonary: Effort and breath sounds normal, no stridor, rhonchi, wheezes, rales.  Abdominal: Soft. Normoactive BS,, no distension, tenderness, rebound or guarding.  Musculoskeletal: Normal range of motion. No edema and no tenderness.  Neuro: Alert.Normal muscle tone coordination. Non-focal Skin: Skin is warm and dry. No rash noted. Not diaphoretic. No erythema. No pallor. Psychiatric: Normal mood and affect. Behavior, judgment, thought content normal.  Lab Results  Component Value Date   WBC 7.3 12/27/2015   HGB 11.6 (L) 12/27/2015   HCT 35.7 (L) 12/27/2015   MCV 86.4 12/27/2015   PLT 232 12/27/2015   Lab Results  Component Value Date   CREATININE 1.92 (H) 12/27/2015   BUN 13 12/27/2015   NA 134 (L) 12/27/2015   K 3.5 12/27/2015   CL 107 12/27/2015   CO2 19 (L) 12/27/2015    No results found for: HGBA1C Lipid Panel  No results found for: CHOL, TRIG, HDL, CHOLHDL, VLDL, LDLCALC     Assessment and plan:   1. Essential hypertension  - COMPLETE METABOLIC PANEL WITH GFR -Refill amlodipine 10, #90, one po q day. -Refill Normodyne 200, #60, one po bid, 2 REfills -Add lisinopril 10 mg once a day, #90 with one refill -Nurse visit to check BP in one month.  2. Blindness of left eye -Follow-up with Dr. Manuella Ghazi as planned.    Return in about 3 months (around 05/05/2016).  The patient was given clear instructions to go to ER or return to medical center if symptoms don't improve, worsen or new problems develop. The patient verbalized understanding.    Micheline Chapman FNP  02/03/2016, 9:51 AM

## 2016-03-09 ENCOUNTER — Other Ambulatory Visit: Payer: PRIVATE HEALTH INSURANCE

## 2016-03-20 ENCOUNTER — Emergency Department (HOSPITAL_COMMUNITY)
Admission: EM | Admit: 2016-03-20 | Discharge: 2016-03-20 | Disposition: A | Discharge status: Home / Self Care | Payer: PRIVATE HEALTH INSURANCE | Attending: Emergency Medicine | Admitting: Emergency Medicine

## 2016-03-20 DIAGNOSIS — I951 Orthostatic hypotension: Secondary | ICD-10-CM

## 2016-03-20 DIAGNOSIS — Z79899 Other long term (current) drug therapy: Secondary | ICD-10-CM | POA: Insufficient documentation

## 2016-03-20 DIAGNOSIS — Z87891 Personal history of nicotine dependence: Secondary | ICD-10-CM | POA: Insufficient documentation

## 2016-03-20 DIAGNOSIS — I959 Hypotension, unspecified: Secondary | ICD-10-CM | POA: Insufficient documentation

## 2016-03-20 DIAGNOSIS — H16002 Unspecified corneal ulcer, left eye: Secondary | ICD-10-CM | POA: Insufficient documentation

## 2016-03-20 DIAGNOSIS — R55 Syncope and collapse: Secondary | ICD-10-CM

## 2016-03-20 LAB — CBC WITH DIFFERENTIAL/PLATELET
Basophils Absolute: 0 10*3/uL (ref 0.0–0.1)
Basophils Relative: 0 %
Eosinophils Absolute: 0.1 10*3/uL (ref 0.0–0.7)
Eosinophils Relative: 1 %
HCT: 34 % — ABNORMAL LOW (ref 36.0–46.0)
Hemoglobin: 11.4 g/dL — ABNORMAL LOW (ref 12.0–15.0)
Lymphocytes Relative: 16 %
Lymphs Abs: 1.5 10*3/uL (ref 0.7–4.0)
MCH: 28.5 pg (ref 26.0–34.0)
MCHC: 33.5 g/dL (ref 30.0–36.0)
MCV: 85 fL (ref 78.0–100.0)
Monocytes Absolute: 0.4 10*3/uL (ref 0.1–1.0)
Monocytes Relative: 4 %
Neutro Abs: 7.3 10*3/uL (ref 1.7–7.7)
Neutrophils Relative %: 79 %
Platelets: 297 10*3/uL (ref 150–400)
RBC: 4 MIL/uL (ref 3.87–5.11)
RDW: 14.1 % (ref 11.5–15.5)
WBC: 9.3 10*3/uL (ref 4.0–10.5)

## 2016-03-20 LAB — COMPREHENSIVE METABOLIC PANEL
ALT: 16 U/L (ref 14–54)
AST: 21 U/L (ref 15–41)
Albumin: 4.2 g/dL (ref 3.5–5.0)
Alkaline Phosphatase: 56 U/L (ref 38–126)
Anion gap: 8 (ref 5–15)
BUN: 13 mg/dL (ref 6–20)
CO2: 24 mmol/L (ref 22–32)
Calcium: 10.2 mg/dL (ref 8.9–10.3)
Chloride: 105 mmol/L (ref 101–111)
Creatinine, Ser: 1.83 mg/dL — ABNORMAL HIGH (ref 0.44–1.00)
GFR calc Af Amer: 34 mL/min — ABNORMAL LOW (ref >60)
GFR calc non Af Amer: 30 mL/min — ABNORMAL LOW (ref >60)
Glucose, Bld: 77 mg/dL (ref 65–99)
Potassium: 4.3 mmol/L (ref 3.5–5.1)
Sodium: 137 mmol/L (ref 135–145)
Total Bilirubin: 0.4 mg/dL (ref 0.3–1.2)
Total Protein: 7.9 g/dL (ref 6.5–8.1)

## 2016-03-20 LAB — CBG MONITORING, ED: Glucose-Capillary: 92 mg/dL (ref 65–99)

## 2016-03-20 MED ORDER — TETRACAINE HCL 0.5 % OP SOLN
1.0000 [drp] | Freq: Once | OPHTHALMIC | Status: DC
  Filled 2016-03-20: qty 2

## 2016-03-20 MED ORDER — TOBRAMYCIN 0.3 % OP OINT
TOPICAL_OINTMENT | Freq: Once | OPHTHALMIC | Status: AC
  Administered 2016-03-20: 1 via OPHTHALMIC
  Filled 2016-03-20: qty 3.5

## 2016-03-20 MED ORDER — ACETAMINOPHEN 325 MG PO TABS
650.0000 mg | ORAL_TABLET | Freq: Once | ORAL | Status: AC
  Administered 2016-03-20: 650 mg via ORAL
  Filled 2016-03-20: qty 2

## 2016-03-20 MED ORDER — SODIUM CHLORIDE 0.9 % IV BOLUS (SEPSIS)
1000.0000 mL | Freq: Once | INTRAVENOUS | Status: AC
  Administered 2016-03-20: 1000 mL via INTRAVENOUS

## 2016-03-20 MED ORDER — TOBRAMYCIN 0.3 % OP SOLN: 1.0000 [drp] | Freq: Four times a day (QID) | OPHTHALMIC | 0 refills | Status: DC

## 2016-03-20 MED ORDER — FLUORESCEIN SODIUM 1 MG OP STRP
ORAL_STRIP | OPHTHALMIC | Status: AC
  Filled 2016-03-20: qty 1

## 2016-03-20 NOTE — Discharge Instructions (Signed)
Apply the drops to your eye 4 times a day, See your eye doctor as soon as possible.

## 2016-03-20 NOTE — ED Notes (Signed)
cbg was 92

## 2016-03-20 NOTE — ED Provider Notes (Signed)
[facility] DEPT Provider Note   CSN: [id number] Arrival date & time: 03/20/16  1000     History   Chief Complaint Chief Complaint  Patient presents with  . Near Syncope    HPI Valerie Coleman is a 55 y.o. female who presents to the ED with cc of presyncope. The event occurred this morning about 1 hour after taking her blood pressure medications. She had one Previous episode of presyncope that happened about one month ago and was seen at [facility] for this. She was told to decrease her blood pressure medications and cut her amlodipine dose in half. The patient did this until she saw a primary care physician who asked her to go back to the previous dose of amlodipine. The patient states that the event occurred today while she was sitting in the car. Her daughter was with her and states that she just got very quiet and seemed like she couldn't really hear her talk. She came to and was immediately aware of surroundings. She does not have any seizure-like activity. Patient denies any recent volume loss, bloody or tarry black stools, history of GI bleeds. He does not take any blood thinning medications. She denies any racing or skipping in her heart. She denies any neurologic symptoms.  HPI  Past Medical History:  Diagnosis Date  . Hypertension     Patient Active Problem List   Diagnosis Date Noted  . Central retinal vein occlusion   . Hypertensive emergency 12/26/2015    Past Surgical History:  Procedure Laterality Date  . CHOLECYSTECTOMY    . TUBAL LIGATION      OB History    No data available       Home Medications    Prior to Admission medications   Medication Sig Start Date End Date Taking? Authorizing Provider  amLODipine (NORVASC) 10 MG tablet Take 1 tablet (10 mg total) by mouth daily. 02/03/16   Micheline Chapman, NP  brimonidine (ALPHAGAN) 0.2 % ophthalmic solution Place 1 drop into the left eye 3 (three) times daily. Patient not taking: Reported on  02/03/2016 12/28/15   Domenic Polite, MD  dorzolamide (TRUSOPT) 2 % ophthalmic solution Place 1 drop into the left eye 3 (three) times daily. Patient not taking: Reported on 02/03/2016 12/28/15   Domenic Polite, MD  labetalol (NORMODYNE) 200 MG tablet Take 1 tablet (200 mg total) by mouth 2 (two) times daily. 02/03/16   Micheline Chapman, NP  lisinopril (PRINIVIL,ZESTRIL) 20 MG tablet Take 1 tablet (20 mg total) by mouth daily. 02/03/16   Micheline Chapman, NP  sertraline (ZOLOFT) 25 MG tablet Take 1 tablet (25 mg total) by mouth daily. Patient not taking: Reported on 02/03/2016 12/28/15   Domenic Polite, MD  timolol (TIMOPTIC) 0.5 % ophthalmic solution Place 1 drop into the left eye 2 (two) times daily. Patient not taking: Reported on 02/03/2016 12/28/15   Domenic Polite, MD    Family History No family history on file.  Social History Social History  Substance Use Topics  . Smoking status: Former Research scientist (life sciences)  . Smokeless tobacco: Never Used  . Alcohol use No     Allergies   Review of patient's allergies indicates no known allergies.   Review of Systems Review of Systems Ten systems reviewed and are negative for acute change, except as noted in the HPI.    Physical Exam Updated Vital Signs BP 121/80 (BP Location: Left Arm)   Pulse 71   Temp 98.4 F (36.9 C) (Oral)  Resp 16   Wt 72.6 kg   SpO2 100%   BMI 21.70 kg/m   Physical Exam  Physical Exam  Nursing note and vitals reviewed. Constitutional: She is oriented to person, place, and time. She appears well-developed and well-nourished. No distress.  HENT:  Head: Normocephalic and atraumatic.  Eyes:LEFT EYE INJECTED. EYE PRESSURE OD: 16 OS: 42 (WITH KNOWN GLAUCOMA AND NO VISION) FLUORESCEIN UPTAKE IN THE LEFT EYE.  Neck: Normal range of motion.  Cardiovascular: Normal rate, regular rhythm and normal heart sounds.  Exam reveals no gallop and no friction rub.   No murmur heard. Pulmonary/Chest: Effort normal and breath sounds  normal. No respiratory distress.  Abdominal: Soft. Bowel sounds are normal. She exhibits no distension and no mass. There is no tenderness. There is no guarding.  Neurological: She is alert and oriented to person, place, and time.  Skin: Skin is warm and dry. She is not diaphoretic.    ED Treatments / Results  Labs (all labs ordered are listed, but only abnormal results are displayed) Labs Reviewed - No data to display  EKG  EKG Interpretation None       Radiology No results found.  Procedures Procedures (including critical care time)  Medications Ordered in ED Medications - No data to display   Initial Impression / Assessment and Plan / ED Course  I have reviewed the triage vital signs and the nursing notes.  Pertinent labs & imaging results that were available during my care of the patient were reviewed by me and considered in my medical decision making (see chart for details).  Clinical Course    PATIENT WITH CORNEAL ABRASION AND INCREASED PRESSURE. SHE HAS AN APPT WITH HER OPHTHALMOLOGIST TODAY. SHE APPEARS TO HAVE ORTHOSTATIC HYPOTENSION AND I HAVE GIVEN THE PT. A FLUID BOLUS WITH RESOLUTION OF HER SXS. SHE IS TO REDUCE HER AMLODIPINE BY HALF AND SEE HER PCP IN THE NEXT 2 DAYS. D/C WITH TOBRAMYCIN DROPS QID.  Final Clinical Impressions(s) / ED Diagnoses   Final diagnoses:  None    New Prescriptions New Prescriptions   No medications on file     Margarita Mail, PA-C 03/20/16 [place], MD 03/21/16 [id number]

## 2016-03-20 NOTE — ED Triage Notes (Signed)
Pt arrives via EMS from eye dr this AM where patient had near syncopal event after taking whole dose of blood pressure pill instead of prescribed half pill. Pt felt lethargic/faint at office with bp palpated at 70 systolic. In transport vitals stabilized. Pt awake, alert, oriented x4. VSS. CBG 123. 20g LAC.

## 2016-03-21 ENCOUNTER — Telehealth: Payer: Self-pay

## 2016-03-21 MED ORDER — LABETALOL HCL 200 MG PO TABS: 200.0000 mg | ORAL_TABLET | Freq: Two times a day (BID) | ORAL | 3 refills | Status: DC

## 2016-03-21 NOTE — Telephone Encounter (Signed)
Refill sent into pharmacy. Thanks!  

## 2016-05-08 ENCOUNTER — Ambulatory Visit: Payer: PRIVATE HEALTH INSURANCE | Admitting: Family Medicine

## 2016-09-29 ENCOUNTER — Ambulatory Visit: Payer: Self-pay | Admitting: Family Medicine

## 2016-10-05 ENCOUNTER — Ambulatory Visit (INDEPENDENT_AMBULATORY_CARE_PROVIDER_SITE_OTHER): Payer: Self-pay | Admitting: Family Medicine

## 2016-10-05 ENCOUNTER — Encounter: Payer: Self-pay | Admitting: Family Medicine

## 2016-10-05 VITALS — BP 152/90 | HR 83 | Temp 98.0°F | Resp 16 | Ht 72.0 in | Wt 195.0 lb

## 2016-10-05 DIAGNOSIS — R0989 Other specified symptoms and signs involving the circulatory and respiratory systems: Secondary | ICD-10-CM

## 2016-10-05 DIAGNOSIS — Z9119 Patient's noncompliance with other medical treatment and regimen: Secondary | ICD-10-CM

## 2016-10-05 DIAGNOSIS — Z91199 Patient's noncompliance with other medical treatment and regimen due to unspecified reason: Secondary | ICD-10-CM

## 2016-10-05 DIAGNOSIS — I10 Essential (primary) hypertension: Secondary | ICD-10-CM

## 2016-10-05 LAB — CBC WITH DIFFERENTIAL/PLATELET
Basophils Absolute: 0 cells/uL (ref 0–200)
Basophils Relative: 0 %
Eosinophils Absolute: 268 cells/uL (ref 15–500)
Eosinophils Relative: 4 %
HCT: 35.8 % (ref 35.0–45.0)
Hemoglobin: 12.6 g/dL (ref 11.7–15.5)
Lymphocytes Relative: 27 %
Lymphs Abs: 1809 cells/uL (ref 850–3900)
MCH: 28.6 pg (ref 27.0–33.0)
MCHC: 35.2 g/dL (ref 32.0–36.0)
MCV: 81.2 fL (ref 80.0–100.0)
MPV: 9.8 fL (ref 7.5–12.5)
Monocytes Absolute: 402 cells/uL (ref 200–950)
Monocytes Relative: 6 %
Neutro Abs: 4221 cells/uL (ref 1500–7800)
Neutrophils Relative %: 63 %
Platelets: 269 10*3/uL (ref 140–400)
RBC: 4.41 MIL/uL (ref 3.80–5.10)
RDW: 15.7 % — ABNORMAL HIGH (ref 11.0–15.0)
WBC: 6.7 10*3/uL (ref 3.8–10.8)

## 2016-10-05 LAB — POCT URINALYSIS DIP (DEVICE)
Bilirubin Urine: NEGATIVE
Glucose, UA: NEGATIVE mg/dL
Ketones, ur: NEGATIVE mg/dL
Leukocytes, UA: NEGATIVE
Nitrite: NEGATIVE
Protein, ur: NEGATIVE mg/dL
Specific Gravity, Urine: 1.02 (ref 1.005–1.030)
Urobilinogen, UA: 0.2 mg/dL (ref 0.0–1.0)
pH: 5 (ref 5.0–8.0)

## 2016-10-05 MED ORDER — LABETALOL HCL 200 MG PO TABS: 200.0000 mg | ORAL_TABLET | Freq: Three times a day (TID) | ORAL | 3 refills | Status: DC

## 2016-10-05 MED ORDER — BLOOD PRESSURE KIT: PACK | 0 refills | Status: DC

## 2016-10-05 MED ORDER — CLONIDINE HCL 0.1 MG PO TABS
0.1000 mg | ORAL_TABLET | Freq: Once | ORAL | Status: AC
  Administered 2016-10-05: 0.1 mg via ORAL

## 2016-10-05 MED ORDER — POTASSIUM CHLORIDE ER 10 MEQ PO TBCR: 10.0000 meq | EXTENDED_RELEASE_TABLET | Freq: Every day | ORAL | 0 refills | Status: DC

## 2016-10-05 MED ORDER — FUROSEMIDE 20 MG PO TABS: 20.0000 mg | ORAL_TABLET | Freq: Every day | ORAL | 0 refills | Status: DC

## 2016-10-05 MED ORDER — AMLODIPINE BESYLATE 10 MG PO TABS: 10.0000 mg | ORAL_TABLET | Freq: Every day | ORAL | 2 refills | Status: DC

## 2016-10-05 MED FILL — LABETALOL HCL 200 MG TABLET: 200 | 30 days supply | Qty: 90 | Fill #0

## 2016-10-05 MED FILL — ?POTASSIUM CL ER 20 MEQ TAB: 20 | 30 days supply | Qty: 30 | Fill #0

## 2016-10-05 MED FILL — AMLODIPINE BESYLATE 10 MG T: 10 | 30 days supply | Qty: 30 | Fill #0

## 2016-10-05 MED FILL — FUROSEMIDE 20 MG TABLET: 20 | 30 days supply | Qty: 30 | Fill #0

## 2016-10-05 NOTE — Progress Notes (Signed)
Patient ID: Valerie Coleman, female    DOB: 09-Aug-1960, 56 y.o.   MRN: [id number]  PCP: Valerie Barrows, FNP  Chief Complaint  Patient presents with  . Follow-up    blood pressure    Subjective:  HPI  Valerie Coleman is a 56 y.o. female presents for evaluation of hypertension.  Medical problems includes hypertension and left eye blindness resulting from hypertensive urgency.  Occassionally checks blood pressure at home but not recently. She has an extensive history of medication noncompliance and reports taking amlodipine 5 mg instead of 10 mg daily due to prior episode of orthostatic hypotension. She is also prescribed labetalol 200 mg twice daily which she reports compliance. Complains today of persistent dizziness regardless of activity. Dizziness has been persistent for several months and she has not see a healthcare provider for this complaint. Reports that she lives in [place] and doesn't have access to transportation unless a family member brings her to [place]. Denies any associated chest pain, unilateral weakness, slurring of speech, syncopal episodes, or recent falls. Uncertain if she has had carotid dopplers previously. Prior ECHO completed July, 2017 revealed grade 1 diastolic dysfunction with preserved EF 55-60%.  She is hesitant to increase dose of amlodipine as she is convinced this medication was responsible for a near syncopal episode a few months ago.   Social History   Social History  . Marital status: Single    Spouse name: N/A  . Number of children: N/A  . Years of education: N/A   Occupational History  . Not on file.   Social History Main Topics  . Smoking status: Former Research scientist (life sciences)  . Smokeless tobacco: Never Used  . Alcohol use No  . Drug use: No  . Sexual activity: Not on file   Other Topics Concern  . Not on file   Social History Narrative  . No narrative on file    No family history on file. Review of Systems See HPI  Patient Active  Problem List   Diagnosis Date Noted  . Central retinal vein occlusion   . Hypertensive emergency 12/26/2015    No Known Allergies  Prior to Admission medications   Medication Sig Start Date End Date Taking? Authorizing Provider  amLODipine (NORVASC) 10 MG tablet Take 1 tablet (10 mg total) by mouth daily. 02/03/16  Yes Micheline Chapman, NP  labetalol (NORMODYNE) 200 MG tablet Take 1 tablet (200 mg total) by mouth 2 (two) times daily. 03/21/16  Yes Micheline Chapman, NP  brimonidine (ALPHAGAN) 0.2 % ophthalmic solution Place 1 drop into the left eye 3 (three) times daily. Patient not taking: Reported on 10/05/2016 12/28/15   Domenic Polite, MD  dorzolamide (TRUSOPT) 2 % ophthalmic solution Place 1 drop into the left eye 3 (three) times daily. Patient not taking: Reported on 10/05/2016 12/28/15   Domenic Polite, MD  lisinopril (PRINIVIL,ZESTRIL) 20 MG tablet Take 1 tablet (20 mg total) by mouth daily. Patient not taking: Reported on 10/05/2016 02/03/16   Micheline Chapman, NP  sertraline (ZOLOFT) 25 MG tablet Take 1 tablet (25 mg total) by mouth daily. Patient not taking: Reported on 10/05/2016 12/28/15   Domenic Polite, MD  timolol (TIMOPTIC) 0.5 % ophthalmic solution Place 1 drop into the left eye 2 (two) times daily. Patient not taking: Reported on 10/05/2016 12/28/15   Domenic Polite, MD  tobramycin (TOBREX) 0.3 % ophthalmic solution Place 1 drop into the left eye every 6 (six) hours. Place one drop in the affected eye every 4  hours for 7 days Patient not taking: Reported on 10/05/2016 03/20/16   Margarita Mail, PA-C    Past Medical, Surgical Family and Social History reviewed and updated.    Objective:   Today's Vitals   10/05/16 1122 10/05/16 1142  BP: (!) 184/100 (!) 174/96  Pulse: 83   Resp: 16   Temp: 98 F (36.7 C)   TempSrc: Oral   SpO2: 100%   Weight: 195 lb (88.5 kg)   Height: 6' (1.829 m)     Wt Readings from Last 3 Encounters:  10/05/16 195 lb (88.5 kg)  03/20/16 160 lb  (72.6 kg)  02/03/16 151 lb (68.5 kg)    Physical Exam  Constitutional: She is oriented to person, place, and time. She appears well-developed and well-nourished.  HENT:  Head: Normocephalic and atraumatic.  Eyes: Conjunctivae are normal. Right eye exhibits normal extraocular motion and no nystagmus. Right pupil is round and reactive.  Neck: Normal range of motion. Neck supple.  Cardiovascular: Normal rate, regular rhythm and normal heart sounds.   Pulses:      Carotid pulses are on the right side with bruit, and 2+ on the left side.      Radial pulses are 2+ on the right side, and 2+ on the left side.  Visible unilateral pulsation of right carotid   Pulmonary/Chest: Effort normal and breath sounds normal.  Musculoskeletal: Normal range of motion.  Neurological: She is alert and oriented to person, place, and time. She has normal strength. She displays tremor. No sensory deficit. She displays a negative Romberg sign. Coordination and gait normal. GCS eye subscore is 4. GCS verbal subscore is 5. GCS motor subscore is 6.  Bilateral fine tremor   Skin: Skin is warm and dry.  Psychiatric: She has a normal mood and affect. Her behavior is normal. Judgment and thought content normal.     Assessment & Plan:  1. Essential hypertension - cloNIDine (CATAPRES) tablet 0.1 mg; Take 1 tablet (0.1 mg total) by mouth once today -Patient renal function previously impaired therefore HCTZ is not appropriate.  -Will trial on Furosamide 20 mg once daily along with Potassium 10 MEq once daily -Increased Labetalol 200 mg from BID to TID  -Prescribed blood pressure to check blood pressure in morning and at bedtime -RTC: 2 weeks for blood pressure recheck -  2. Bruit of right carotid artery - US Carotid Duplex Bilateral  3. H/O noncompliance with medical treatment, presenting hazards to health -Reinforced the importance of medication adherence in order to improve blood pressure.   CMP, CBC, Lipid, TSH,  and Hemoglobin A1C  RTC: 2 weeks   Carroll Sage. [name], MSN, [facility] [address] [place], [place] [address] [phone]

## 2016-10-05 NOTE — Patient Instructions (Addendum)
I have increased your Labetalol 200 mg three times daily   I have added Lasix 20 mg once daily and take a potassium tablet along with your morning dose of lasix .    Hypertension Hypertension is another name for high blood pressure. High blood pressure forces your heart to work harder to pump blood. This can cause problems over time. There are two numbers in a blood pressure reading. There is a top number (systolic) over a bottom number (diastolic). It is best to have a blood pressure below 120/80. Healthy choices can help lower your blood pressure. You may need medicine to help lower your blood pressure if:  Your blood pressure cannot be lowered with healthy choices.  Your blood pressure is higher than 130/80. Follow these instructions at home: Eating and drinking   If directed, follow the DASH eating plan. This diet includes:  Filling half of your plate at each meal with fruits and vegetables.  Filling one quarter of your plate at each meal with whole grains. Whole grains include whole wheat pasta, brown rice, and whole grain bread.  Eating or drinking low-fat dairy products, such as skim milk or low-fat yogurt.  Filling one quarter of your plate at each meal with low-fat (lean) proteins. Low-fat proteins include fish, skinless chicken, eggs, beans, and tofu.  Avoiding fatty meat, cured and processed meat, or chicken with skin.  Avoiding premade or processed food.  Eat less than 1,500 mg of salt (sodium) a day.  Limit alcohol use to no more than 1 drink a day for nonpregnant women and 2 drinks a day for men. One drink equals 12 oz of beer, 5 oz of wine, or 1 oz of hard liquor. Lifestyle   Work with your doctor to stay at a healthy weight or to lose weight. Ask your doctor what the best weight is for you.  Get at least 30 minutes of exercise that causes your heart to beat faster (aerobic exercise) most days of the week. This may include walking, swimming, or biking.  Get at  least 30 minutes of exercise that strengthens your muscles (resistance exercise) at least 3 days a week. This may include lifting weights or pilates.  Do not use any products that contain nicotine or tobacco. This includes cigarettes and e-cigarettes. If you need help quitting, ask your doctor.  Check your blood pressure at home as told by your doctor.  Keep all follow-up visits as told by your doctor. This is important. Medicines   Take over-the-counter and prescription medicines only as told by your doctor. Follow directions carefully.  Do not skip doses of blood pressure medicine. The medicine does not work as well if you skip doses. Skipping doses also puts you at risk for problems.  Ask your doctor about side effects or reactions to medicines that you should watch for. Contact a doctor if:  You think you are having a reaction to the medicine you are taking.  You have headaches that keep coming back (recurring).  You feel dizzy.  You have swelling in your ankles.  You have trouble with your vision. Get help right away if:  You get a very bad headache.  You start to feel confused.  You feel weak or numb.  You feel faint.  You get very bad pain in your:  Chest.  Belly (abdomen).  You throw up (vomit) more than once.  You have trouble breathing. Summary  Hypertension is another name for high blood pressure.  Making  healthy choices can help lower blood pressure. If your blood pressure cannot be controlled with healthy choices, you may need to take medicine. This information is not intended to replace advice given to you by your health care provider. Make sure you discuss any questions you have with your health care provider. Document Released: 11/15/2007 Document Revised: 04/26/2016 Document Reviewed: 04/26/2016 Elsevier Interactive Patient Education  2017 [employer].

## 2016-10-06 LAB — LIPID PANEL
Cholesterol: 397 mg/dL — ABNORMAL HIGH (ref <200)
HDL: 47 mg/dL — ABNORMAL LOW (ref >50)
LDL Cholesterol: 279 mg/dL — ABNORMAL HIGH (ref <100)
Total CHOL/HDL Ratio: 8.4 Ratio — ABNORMAL HIGH (ref <5.0)
Triglycerides: 354 mg/dL — ABNORMAL HIGH (ref <150)
VLDL: 71 mg/dL — ABNORMAL HIGH (ref <30)

## 2016-10-06 LAB — COMPLETE METABOLIC PANEL WITH GFR
AG Ratio: 1.4 Ratio (ref 1.0–2.5)
ALT: 21 U/L (ref 6–29)
AST: 20 U/L (ref 10–35)
Albumin: 4.6 g/dL (ref 3.6–5.1)
Alkaline Phosphatase: 68 U/L (ref 33–130)
BUN/Creatinine Ratio: 13.3 Ratio (ref 6–22)
BUN: 24 mg/dL (ref 7–25)
CO2: 17 mmol/L — ABNORMAL LOW (ref 20–31)
Calcium: 10 mg/dL (ref 8.6–10.4)
Chloride: 106 mmol/L (ref 98–110)
Creat: 1.8 mg/dL — ABNORMAL HIGH (ref 0.50–1.05)
GFR, Est African American: 35 mL/min — ABNORMAL LOW (ref >=60)
GFR, Est Non African American: 31 mL/min — ABNORMAL LOW (ref >=60)
Globulin: 3.3 g/dL (ref 1.9–3.7)
Glucose, Bld: 92 mg/dL (ref 65–99)
Potassium: 4.7 mmol/L (ref 3.5–5.3)
Sodium: 138 mmol/L (ref 135–146)
Total Bilirubin: 0.3 mg/dL (ref 0.2–1.2)
Total Protein: 7.9 g/dL (ref 6.1–8.1)

## 2016-10-06 LAB — HEMOGLOBIN A1C
Hgb A1c MFr Bld: 5.2 % (ref <5.7)
Mean Plasma Glucose: 103 mg/dL

## 2016-10-06 LAB — THYROID PANEL WITH TSH
Free Thyroxine Index: 2.1 (ref 1.4–3.8)
T3 Uptake: 26 % (ref 22–35)
T4, Total: 8.1 ug/dL (ref 4.5–12.0)
TSH: 3.16 mIU/L

## 2016-10-10 ENCOUNTER — Telehealth: Payer: Self-pay | Admitting: Family Medicine

## 2016-10-10 NOTE — Telephone Encounter (Signed)
Please schedule carotid doppler study for patient. She is scheduled for a follow-up appointment on 10/19/2016 if her study could be scheduled for the same day, that would be great

## 2016-10-10 NOTE — Telephone Encounter (Signed)
Left a vm for Felicia to cal and schedule patient appointment.

## 2016-10-11 ENCOUNTER — Telehealth: Payer: Self-pay | Admitting: Family Medicine

## 2016-10-11 ENCOUNTER — Telehealth: Payer: Self-pay

## 2016-10-11 DIAGNOSIS — R944 Abnormal results of kidney function studies: Secondary | ICD-10-CM

## 2016-10-11 NOTE — Telephone Encounter (Signed)
Spoke with patient and she states that she just started her medication today and that she will start readings today and let us know.

## 2016-10-11 NOTE — Telephone Encounter (Signed)
Spoke with patient daughter and let her know that her appointment is 10/25/2016 at 430pm at [facility] on [facility]

## 2016-10-11 NOTE — Telephone Encounter (Signed)
Left a voicemail for patient to return call:  1. Wanted to check on blood pressure readings over the coarse of the last week.  2. Secondly, renal function remains concerning. I am referring her to nephrology for further evaluation.  3. Scheduling of carotid doppler is still pending    Carroll Sage. [name], MSN, [facility] [address] [place], [place] [address] [phone]

## 2016-10-15 ENCOUNTER — Telehealth: Payer: Self-pay | Admitting: Family Medicine

## 2016-10-15 NOTE — Telephone Encounter (Signed)
erroneous

## 2016-10-24 ENCOUNTER — Telehealth: Payer: Self-pay | Admitting: Family Medicine

## 2016-10-24 NOTE — Telephone Encounter (Signed)
Could you follow-up with [facility] to ensure patient has been schedule per the referral submitted on 10/11/2016.

## 2016-10-25 ENCOUNTER — Inpatient Hospital Stay (HOSPITAL_COMMUNITY): Admission: RE | Admit: 2016-10-25 | Payer: Self-pay | Source: Ambulatory Visit

## 2016-10-25 NOTE — Telephone Encounter (Signed)
Patient is scheduled on 11/17/2016 at 1030am.

## 2016-10-26 ENCOUNTER — Encounter (HOSPITAL_COMMUNITY): Payer: Self-pay | Admitting: Family Medicine

## 2016-11-01 ENCOUNTER — Telehealth: Payer: Self-pay

## 2016-11-01 NOTE — Telephone Encounter (Signed)
-----   Message from Scot Jun, [place] sent at 10/29/2016 10:59 PM EDT ----- Regarding: FW: call about renal function  Please follow-up with patient to see if she has received an appointment to see nephrology and I need for her to return to the office within 2 weeks for blood pressure evaluation.   ----- Message ----- From: Scot Jun, FNP Sent: 10/11/2016   9:23 PM To: Scot Jun, FNP Subject: call about renal function

## 2016-11-01 NOTE — Telephone Encounter (Signed)
Patient scheduled with Nephrology on 11/17/2016 and patient will try to come back in for follow up once she gets done with some of her appointments.

## 2016-11-13 ENCOUNTER — Telehealth: Payer: Self-pay

## 2016-11-13 NOTE — Telephone Encounter (Signed)
Valerie Coleman,  Please contact patient to advise medication was refilled at last office visit and she has remaining refills. She should contact [facility]. Please schedule her an appointment to be seen in office for a blood pressure follow-up with me. She never came in from her 2 week follow-up appointment.

## 2016-12-04 ENCOUNTER — Telehealth: Payer: Self-pay

## 2016-12-04 MED ORDER — LABETALOL HCL 200 MG PO TABS: 200.0000 mg | ORAL_TABLET | Freq: Three times a day (TID) | ORAL | 0 refills | Status: DC

## 2016-12-28 ENCOUNTER — Ambulatory Visit: Payer: Self-pay | Admitting: Family Medicine

## 2017-01-01 ENCOUNTER — Encounter: Payer: Self-pay | Admitting: Family Medicine

## 2017-01-01 ENCOUNTER — Ambulatory Visit (INDEPENDENT_AMBULATORY_CARE_PROVIDER_SITE_OTHER): Payer: Self-pay | Admitting: Family Medicine

## 2017-01-01 VITALS — BP 196/116 | HR 98 | Temp 98.6°F | Resp 16 | Ht 72.0 in | Wt 200.6 lb

## 2017-01-01 DIAGNOSIS — I10 Essential (primary) hypertension: Secondary | ICD-10-CM

## 2017-01-01 DIAGNOSIS — N183 Chronic kidney disease, stage 3 unspecified: Secondary | ICD-10-CM

## 2017-01-01 DIAGNOSIS — R809 Proteinuria, unspecified: Secondary | ICD-10-CM

## 2017-01-01 LAB — COMPLETE METABOLIC PANEL WITH GFR
ALT: 22 U/L (ref 6–29)
AST: 22 U/L (ref 10–35)
Albumin: 4.6 g/dL (ref 3.6–5.1)
Alkaline Phosphatase: 75 U/L (ref 33–130)
BUN: 22 mg/dL (ref 7–25)
CO2: 15 mmol/L — ABNORMAL LOW (ref 20–31)
Calcium: 9.5 mg/dL (ref 8.6–10.4)
Chloride: 102 mmol/L (ref 98–110)
Creat: 1.9 mg/dL — ABNORMAL HIGH (ref 0.50–1.05)
GFR, Est African American: 33 mL/min — ABNORMAL LOW (ref >=60)
GFR, Est Non African American: 29 mL/min — ABNORMAL LOW (ref >=60)
Glucose, Bld: 158 mg/dL — ABNORMAL HIGH (ref 65–99)
Potassium: 4.2 mmol/L (ref 3.5–5.3)
Sodium: 134 mmol/L — ABNORMAL LOW (ref 135–146)
Total Bilirubin: 0.3 mg/dL (ref 0.2–1.2)
Total Protein: 8.1 g/dL (ref 6.1–8.1)

## 2017-01-01 LAB — POCT URINALYSIS DIP (DEVICE)
Bilirubin Urine: NEGATIVE
Glucose, UA: NEGATIVE mg/dL
Ketones, ur: NEGATIVE mg/dL
Nitrite: NEGATIVE
Protein, ur: 100 mg/dL — AB
Specific Gravity, Urine: <=1.005 (ref 1.005–1.030)
Urobilinogen, UA: 0.2 mg/dL (ref 0.0–1.0)
pH: 5.5 (ref 5.0–8.0)

## 2017-01-01 MED ORDER — LABETALOL HCL 200 MG PO TABS: 200.0000 mg | ORAL_TABLET | Freq: Three times a day (TID) | ORAL | 3 refills | Status: DC

## 2017-01-01 MED ORDER — AMLODIPINE BESYLATE 10 MG PO TABS: 10.0000 mg | ORAL_TABLET | Freq: Every day | ORAL | 2 refills | Status: DC

## 2017-01-01 MED ORDER — FUROSEMIDE 20 MG PO TABS: 20.0000 mg | ORAL_TABLET | Freq: Every day | ORAL | 0 refills | Status: DC

## 2017-01-01 MED ORDER — SERTRALINE HCL 25 MG PO TABS: 25.0000 mg | ORAL_TABLET | Freq: Every day | ORAL | 0 refills | Status: DC

## 2017-01-01 MED ORDER — POTASSIUM CHLORIDE ER 10 MEQ PO TBCR: 10.0000 meq | EXTENDED_RELEASE_TABLET | Freq: Every day | ORAL | 0 refills | Status: DC

## 2017-01-01 MED ORDER — CLONIDINE HCL 0.1 MG PO TABS
0.2000 mg | ORAL_TABLET | Freq: Once | ORAL | Status: AC
  Administered 2017-01-01: 0.2 mg via ORAL

## 2017-01-01 MED FILL — LABETALOL HCL 200 MG TABLET: 200 | 30 days supply | Qty: 90 | Fill #0

## 2017-01-01 MED FILL — AMLODIPINE BESYLATE 10 MG T: 10 | 30 days supply | Qty: 30 | Fill #0

## 2017-01-01 MED FILL — ?FUROSEMIDE 20 MG TABLET: 20 | 30 days supply | Qty: 30 | Fill #0

## 2017-01-01 NOTE — Patient Instructions (Signed)
Chronic Kidney Disease, Adult Chronic kidney disease (CKD) occurs when the kidneys are damaged during a period of 3 or more months. The kidneys are two organs that do many important jobs in the body, which include:  Removing wastes and extra fluids from the blood.  Making hormones that maintain the amount of fluid in your tissues and blood vessels.  Maintaining the right amount of fluids and chemicals in the body.  A small amount of kidney damage may not cause problems, but a large amount of damage may make it difficult or impossible for the kidneys to work the way they should. If steps are not taken to slow down the kidney damage or stop it from getting worse, the kidneys may stop working permanently (end stage kidney disease). Most of the time, CKD does not go away, but it can often be controlled. People who have CKD can usually live normal lives. What are the causes? The most common causes of this condition are diabetes and high blood pressure (hypertension). Other causes include:  Heart and blood vessel (cardiovascular) disease.  Kidney diseases: ? Glomerulonephritis. ? Interstitial nephritis. ? Polycystic kidney disease. ? Renal vascular disease.  Diseases that affect the immune system.  Genetic diseases.  Medicines that damage the kidneys, such as anti-inflammatory medicines.  Poisoning.  Being around or in contact with poisonous (toxic) substances.  A kidney or urinary infection that occurs again (recurs).  Vasculitis.  Repeat kidney infections.  A problem with urine flow that may be caused by: ? Cancer. ? Having kidney stones more than one time. ? An enlarged prostate in males.  What increases the risk? This condition is more likely to develop in people who are:  Older than age 6.  Female.  Of African-American descent.  Current smokers or former smokers.  Obese.  You may also have an increased risk for CKD if you have a family history of CKDor  youfrequently take medicines that are damaging to the kidneys. What are the signs or symptoms? Symptoms develop slowly and may not be obvious until the kidney damage becomes severe. It is possible to have a kidney disease for years without showing any symptoms. Symptoms of this condition can include:  Swelling (edema) of the face, legs, ankles, or feet.  Numbness, tingling, or loss of feeling (sensation) in the hands or feet.  Tiredness (lethargy).  Nausea or vomiting.  Confusion or trouble concentrating.  Problems with urination, such as: ? Painful or burning feeling during urination. ? Decreased urine production. ? Frequent urination, especially at night. ? Bloody urine.  Muscle twitches and cramps, especially in the legs.  Shortness of breath.  Weakness.  Constant itchiness.  Loss of appetite.  Metallic taste in the mouth.  Trouble sleeping.  Pale lining of the eyelids and surface of the eye (conjunctiva).  How is this diagnosed? This condition may be diagnosed with various tests. Tests may include:  Blood tests.  Urine tests.  Imaging tests.  A test in which a sample of tissue is removed from the kidneys to be looked at under a microscope (kidney biopsy).  These test results will help your health care provider determine what class of CKD you have. How is this treated? Most cases of CKD cannot be cured. Treatment usually involves relieving symptoms and preventing or slowing the progression of the disease. Treatment may include:  A special diet, which may require you to avoid alcohol, salty foods (sodium), and foods that are high in potassium, calcium, and protein.  Medicines: ? To lower blood pressure. ? To relieve low blood count (anemia). ? To relieve swelling. ? To protect your bones. ? To improve the balance of electrolytes in your blood.  Removing toxic waste from the body by using hemodialysis or peritoneal dialysis if the kidneys can no longer do  their job (kidney failure).  Management of other conditions that are causing your CKD or making it worse.  Follow these instructions at home:  Follow your prescribed diet.  Take over-the-counter and prescription medicines only as told by your health care provider. ? Do not take any new medicines unless approved by your health care provider. Many medicines can worsen your kidney damage. ? Do not take any vitamin and mineral supplements unless approved by your health care provider. Many nutritional supplements can worsen your kidney damage. ? The dose of some medicines that you take may need to be adjusted.  Do not use any tobacco products, such as cigarettes, chewing tobacco, and e-cigarettes. If you need help quitting, ask your health care provider.  Keep all follow-up visits as told by your health care provider. This is important.  Keep track of your blood pressure. Report changes in your blood pressure as told by your health care provider.  Achieve and maintain a healthy weight. If you need help with this, ask your health care provider.  Start or continue an exercise plan. Try to exercise at least 30 minutes a day, 5 days a week.  Stay current with immunizations as told by your health care provider. Where to find more information:  American Association of Kidney Patients: BombTimer.gl  National Kidney Foundation: www.kidney.[place]: https://mathis.com/  Life Options [facility]: www.lifeoptions.org and www.kidneyschool.org Contact a health care provider if:  Your symptoms get worse.  You develop new symptoms. Get help right away if:  You develop symptoms of end-stage kidney disease, which include: ? Headaches. ? Abnormally dark or light skin. ? Numbness in the hands or feet. ? Easy bruising. ? Frequent hiccups. ? Chest pain. ? Shortness of breath. ? End of menstruation in women.  You have a fever.  You have decreased urine  production.  You have pain or bleeding when you urinate. This information is not intended to replace advice given to you by your health care provider. Make sure you discuss any questions you have with your health care provider. Document Released: 03/07/2008 Document Revised: 11/04/2015 Document Reviewed: 01/26/2012 Elsevier Interactive Patient Education  2017 Elsevier Inc.   Hypertension Hypertension is another name for high blood pressure. High blood pressure forces your heart to work harder to pump blood. This can cause problems over time. There are two numbers in a blood pressure reading. There is a top number (systolic) over a bottom number (diastolic). It is best to have a blood pressure below 120/80. Healthy choices can help lower your blood pressure. You may need medicine to help lower your blood pressure if:  Your blood pressure cannot be lowered with healthy choices.  Your blood pressure is higher than 130/80.  Follow these instructions at home: Eating and drinking  If directed, follow the DASH eating plan. This diet includes: ? Filling half of your plate at each meal with fruits and vegetables. ? Filling one quarter of your plate at each meal with whole grains. Whole grains include whole wheat pasta, brown rice, and whole grain bread. ? Eating or drinking low-fat dairy products, such as skim milk or low-fat yogurt. ? Filling one quarter of your  plate at each meal with low-fat (lean) proteins. Low-fat proteins include fish, skinless chicken, eggs, beans, and tofu. ? Avoiding fatty meat, cured and processed meat, or chicken with skin. ? Avoiding premade or processed food.  Eat less than 1,500 mg of salt (sodium) a day.  Limit alcohol use to no more than 1 drink a day for nonpregnant women and 2 drinks a day for men. One drink equals 12 oz of beer, 5 oz of wine, or 1 oz of hard liquor. Lifestyle  Work with your doctor to stay at a healthy weight or to lose weight. Ask your  doctor what the best weight is for you.  Get at least 30 minutes of exercise that causes your heart to beat faster (aerobic exercise) most days of the week. This may include walking, swimming, or biking.  Get at least 30 minutes of exercise that strengthens your muscles (resistance exercise) at least 3 days a week. This may include lifting weights or pilates.  Do not use any products that contain nicotine or tobacco. This includes cigarettes and e-cigarettes. If you need help quitting, ask your doctor.  Check your blood pressure at home as told by your doctor.  Keep all follow-up visits as told by your doctor. This is important. Medicines  Take over-the-counter and prescription medicines only as told by your doctor. Follow directions carefully.  Do not skip doses of blood pressure medicine. The medicine does not work as well if you skip doses. Skipping doses also puts you at risk for problems.  Ask your doctor about side effects or reactions to medicines that you should watch for. Contact a doctor if:  You think you are having a reaction to the medicine you are taking.  You have headaches that keep coming back (recurring).  You feel dizzy.  You have swelling in your ankles.  You have trouble with your vision. Get help right away if:  You get a very bad headache.  You start to feel confused.  You feel weak or numb.  You feel faint.  You get very bad pain in your: ? Chest. ? Belly (abdomen).  You throw up (vomit) more than once.  You have trouble breathing. Summary  Hypertension is another name for high blood pressure.  Making healthy choices can help lower blood pressure. If your blood pressure cannot be controlled with healthy choices, you may need to take medicine. This information is not intended to replace advice given to you by your health care provider. Make sure you discuss any questions you have with your health care provider. Document Released: 11/15/2007  Document Revised: 04/26/2016 Document Reviewed: 04/26/2016 Elsevier Interactive Patient Education  [employer].

## 2017-01-01 NOTE — Progress Notes (Signed)
Patient ID: Valerie Coleman, female    DOB: 1961/05/31, 56 y.o.   MRN: [id number]  PCP: Scot Jun, FNP  Chief Complaint  Patient presents with  . Follow-up    blood pressure    Subjective:  HPI Valerie Coleman is a 56 y.o. female presents for evaluation of hypertension follow-up. 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 Tristyn reports that she has been experiencing hardship with transportation and has been unable to make herfollow-up appointments. She was previously referred to nephrology for abnormal renal functions and reports that the practice required a $200 up front prior to being seen at their office. She could not afford the cost of the visit and cancelled her appointment. Kendel was previously scheduled for carotid doppler for further evaluation of right carotid bruit and she did not make the appointment for the testing. She reports today that she has been out of blood pressure medication for almost 1 week. Reports occasionally monitoring her blood pressure at local retail stores and obtains readings almost consistently in the 160's and 90's. She denies any chest pain, dizziness, shortness of breath, or headaches today.    Social History   Social History  . Marital status: Single    Spouse name: N/A  . Number of children: N/A  . Years of education: N/A   Occupational History  . Not on file.   Social History Main Topics  . Smoking status: Former Research scientist (life sciences)  . Smokeless tobacco: Never Used  . Alcohol use No  . Drug use: No  . Sexual activity: Not on file   Other Topics Concern  . Not on file   Social History Narrative  . No narrative on file   History reviewed. No pertinent family history. Review of Systems HPI   Patient Active Problem List   Diagnosis Date Noted  . Central retinal vein occlusion   . Hypertensive emergency 12/26/2015    No Known Allergies  Prior to  Admission medications   Medication Sig Start Date End Date Taking? Authorizing Provider  amLODipine (NORVASC) 10 MG tablet Take 1 tablet (10 mg total) by mouth daily. Patient not taking: Reported on 01/01/2017 10/05/16   Scot Jun, FNP  Blood Pressure KIT Check blood pressure at morning and before bed. Patient not taking: Reported on 01/01/2017 10/05/16   Scot Jun, FNP  brimonidine [facility]) 0.2 % ophthalmic solution Place 1 drop into the left eye 3 (three) times daily. Patient not taking: Reported on 10/05/2016 12/28/15   Domenic Polite, MD  dorzolamide (TRUSOPT) 2 % ophthalmic solution Place 1 drop into the left eye 3 (three) times daily. Patient not taking: Reported on 10/05/2016 12/28/15   Domenic Polite, MD  furosemide (LASIX) 20 MG tablet Take 1 tablet (20 mg total) by mouth daily. Patient not taking: Reported on 01/01/2017 10/05/16   Scot Jun, FNP  labetalol (NORMODYNE) 200 MG tablet Take 1 tablet (200 mg total) by mouth 3 (three) times daily. Patient not taking: Reported on 01/01/2017 12/04/16   Scot Jun, FNP  potassium chloride (K-DUR) 10 MEQ tablet Take 1 tablet (10 mEq total) by mouth daily. Patient not taking: Reported on 01/01/2017 10/05/16   Scot Jun, FNP  sertraline (ZOLOFT) 25 MG tablet Take 1 tablet (25 mg total) by mouth daily. Patient not taking: Reported on 10/05/2016 12/28/15   Domenic Polite, MD  timolol (TIMOPTIC) 0.5 % ophthalmic solution Place 1 drop into the left eye  2 (two) times daily. Patient not taking: Reported on 10/05/2016 12/28/15   Domenic Polite, MD  tobramycin (TOBREX) 0.3 % ophthalmic solution Place 1 drop into the left eye every 6 (six) hours. Place one drop in the affected eye every 4 hours for 7 days Patient not taking: Reported on 10/05/2016 03/20/16   Margarita Mail, PA-C    Past Medical, Surgical Family and Social History reviewed and updated.    Objective:   Today's Vitals   01/01/17 1516 01/01/17 1551  BP:  (!) 200/110 (!) 196/116  Pulse: 98   Resp: 16   Temp: 98.6 F (37 C)   TempSrc: Oral   SpO2: 100%   Weight: 200 lb 9.6 oz (91 kg)   Height: 6' (1.829 m)      Wt Readings from Last 3 Encounters:  01/01/17 200 lb 9.6 oz (91 kg)  10/05/16 195 lb (88.5 kg)  03/20/16 160 lb (72.6 kg)   Physical Exam  Constitutional: She is oriented to person, place, and time. She appears well-developed and well-nourished.  HENT:  Head: Normocephalic and atraumatic.  Eyes: Pupils are equal, round, and reactive to light.  Neck: Normal range of motion. Carotid bruit is present.  Right carotid bruit noted   Cardiovascular: Normal rate, regular rhythm, normal heart sounds, intact distal pulses and normal pulses.   Pulses:      Carotid pulses are 2+ on the right side, and 2+ on the left side. Pulmonary/Chest: Effort normal and breath sounds normal.  Musculoskeletal: Normal range of motion.  Neurological: She is alert and oriented to person, place, and time.  Skin: Skin is warm and dry.  Psychiatric: She has a normal mood and affect. Her behavior is normal. Judgment and thought content normal.   Assessment & Plan:  1. Essential hypertension, uncontrolled - cloNIDine (CATAPRES) tablet 0.2 mg; Take 2 tablets (0.2 mg total) by mouth once. - COMPLETE METABOLIC PANEL WITH GFR -Refilled labetalol, amlodipine, and furosemide. If renal function has worsened, will d/c Furosemide and consider adding another medication for blood pressure control.   2. Proteinuria, unspecified type - Microalbumin/Creatinine Ratio, Urine  3. CKD (chronic kidney disease) stage 3, GFR 30-59 ml/min - will continue to monitor function in office at present every 3 months   RTC: 6 months hypertension follow-up and 3 month renal function lab recheck.   Carroll Sage. [name], MSN, FNP-C [facility]  [address] [place], [place] [address] [phone]

## 2017-01-02 LAB — MICROALBUMIN / CREATININE URINE RATIO
Creatinine, Urine: 64 mg/dL (ref 20–320)
Microalb Creat Ratio: 402 mcg/mg creat — ABNORMAL HIGH (ref <30)
Microalb, Ur: 25.7 mg/dL

## 2017-01-04 ENCOUNTER — Telehealth: Payer: Self-pay | Admitting: Family Medicine

## 2017-01-04 DIAGNOSIS — N183 Chronic kidney disease, stage 3 unspecified: Secondary | ICD-10-CM | POA: Insufficient documentation

## 2017-01-04 DIAGNOSIS — N1832 Chronic kidney disease, stage 3b: Secondary | ICD-10-CM | POA: Insufficient documentation

## 2017-01-04 MED ORDER — HYDRALAZINE HCL 25 MG PO TABS: 25.0000 mg | ORAL_TABLET | Freq: Three times a day (TID) | ORAL | 4 refills | Status: DC

## 2017-01-04 NOTE — Telephone Encounter (Signed)
Spoke with patient to advise of worsening renal function. Advised to discontinue Lasix.  Continue amlodipine and labetalol. Adding hydralazine 25 mg TID, e-prescribed to [facility] in [place].  Mailing patient a Goodrx card in order to obtain medication at a discounted cost.  Carroll Sage. [name], MSN, FNP-C [facility]  [address] [place], [place] [address] [phone]

## 2017-01-10 NOTE — Addendum Note (Signed)
Addended by: Scot Jun on: 01/10/2017 12:56 AM   Modules accepted: Orders

## 2017-01-22 ENCOUNTER — Telehealth: Payer: Self-pay

## 2017-01-22 NOTE — Telephone Encounter (Signed)
Patient was advised to go to ED and states that she will go if she has another episode.

## 2017-01-22 NOTE — Telephone Encounter (Signed)
Patient states that she is having several episodes where she feels like she is going to pass out and she drinks fluid and do cold compress and feels better. Please advise

## 2017-01-22 NOTE — Telephone Encounter (Signed)
Contact patient and advise she needs to go to the closest Emergency Department for further evaluation. If she doesn't have transportation she should call EMS. I can't evaluate her symptoms over the phone, however due to her medical history, I recommend that she follow-up at the emergency department.  Carroll Sage. [name], MSN, FNP-C [facility]  [address] [place], [place] [address] [phone]

## 2017-02-22 ENCOUNTER — Other Ambulatory Visit: Payer: Self-pay | Admitting: Family Medicine

## 2017-03-14 ENCOUNTER — Telehealth: Payer: Self-pay

## 2017-03-14 MED ORDER — LABETALOL HCL 200 MG PO TABS
ORAL_TABLET | ORAL | 0 refills | Status: DC
Start: 2017-03-14 — End: 2017-04-23

## 2017-03-14 NOTE — Telephone Encounter (Signed)
Patient notified and states she will call back and schedule

## 2017-03-14 NOTE — Telephone Encounter (Signed)
Advise patient she needs to return this month for renal function evaluation

## 2017-03-16 ENCOUNTER — Other Ambulatory Visit: Payer: Self-pay

## 2017-03-28 ENCOUNTER — Telehealth: Payer: Self-pay

## 2017-03-28 NOTE — Telephone Encounter (Signed)
Hinton Dyer from [place] states that patient was schedule 2 times and cancel both appointment. They reached out to patient on 10/17/208 and patient stated that she wouldn't like to reschedule another appointment.

## 2017-04-23 ENCOUNTER — Telehealth: Payer: Self-pay

## 2017-04-23 ENCOUNTER — Other Ambulatory Visit: Payer: Self-pay | Admitting: Family Medicine

## 2017-04-23 MED ORDER — LABETALOL HCL 200 MG PO TABS: ORAL_TABLET | ORAL | 2 refills | Status: DC

## 2017-04-23 NOTE — Progress Notes (Signed)
Patient notified

## 2017-04-23 NOTE — Telephone Encounter (Signed)
Patient wants a refill on Labetalol until she is able to come in for a appointment. Patient states she can't come in right now because of her eye issues

## 2017-04-23 NOTE — Telephone Encounter (Signed)
Patient states that she is unable to get in for a appointment because of her eye. Patient would like to have a refill on Labetalol. Please advise

## 2017-04-23 NOTE — Progress Notes (Signed)
I have refilled enough medication through December. She will need to be seen in office no later January before any other medication will be refilled.  Valerie Coleman. [name], MSN, FNP-C [name]  [address] [place], [place] [address] [phone]

## 2017-05-12 DIAGNOSIS — I639 Cerebral infarction, unspecified: Secondary | ICD-10-CM

## 2017-05-12 HISTORY — DX: Cerebral infarction, unspecified: I63.9

## 2017-05-16 ENCOUNTER — Emergency Department (HOSPITAL_COMMUNITY): Payer: Medicaid Other

## 2017-05-16 ENCOUNTER — Inpatient Hospital Stay (HOSPITAL_COMMUNITY)
Admission: EM | Admit: 2017-05-16 | Discharge: 2017-05-19 | DRG: 066 | Disposition: A | Discharge status: Home Health | Payer: Medicaid Other | Attending: Internal Medicine | Admitting: Internal Medicine

## 2017-05-16 ENCOUNTER — Inpatient Hospital Stay (HOSPITAL_COMMUNITY): Payer: Medicaid Other

## 2017-05-16 ENCOUNTER — Encounter (HOSPITAL_COMMUNITY): Payer: Self-pay | Admitting: Emergency Medicine

## 2017-05-16 ENCOUNTER — Other Ambulatory Visit: Payer: Self-pay

## 2017-05-16 DIAGNOSIS — R29703 NIHSS score 3: Secondary | ICD-10-CM | POA: Diagnosis present

## 2017-05-16 DIAGNOSIS — R220 Localized swelling, mass and lump, head: Secondary | ICD-10-CM | POA: Diagnosis present

## 2017-05-16 DIAGNOSIS — Z87891 Personal history of nicotine dependence: Secondary | ICD-10-CM | POA: Diagnosis not present

## 2017-05-16 DIAGNOSIS — I63311 Cerebral infarction due to thrombosis of right middle cerebral artery: Secondary | ICD-10-CM | POA: Diagnosis not present

## 2017-05-16 DIAGNOSIS — H409 Unspecified glaucoma: Secondary | ICD-10-CM | POA: Diagnosis present

## 2017-05-16 DIAGNOSIS — R1310 Dysphagia, unspecified: Secondary | ICD-10-CM | POA: Diagnosis present

## 2017-05-16 DIAGNOSIS — N183 Chronic kidney disease, stage 3 (moderate): Secondary | ICD-10-CM | POA: Diagnosis present

## 2017-05-16 DIAGNOSIS — R29810 Facial weakness: Secondary | ICD-10-CM | POA: Diagnosis present

## 2017-05-16 DIAGNOSIS — I63331 Cerebral infarction due to thrombosis of right posterior cerebral artery: Secondary | ICD-10-CM | POA: Diagnosis not present

## 2017-05-16 DIAGNOSIS — I129 Hypertensive chronic kidney disease with stage 1 through stage 4 chronic kidney disease, or unspecified chronic kidney disease: Secondary | ICD-10-CM | POA: Diagnosis present

## 2017-05-16 DIAGNOSIS — I10 Essential (primary) hypertension: Secondary | ICD-10-CM | POA: Diagnosis not present

## 2017-05-16 DIAGNOSIS — I639 Cerebral infarction, unspecified: Principal | ICD-10-CM | POA: Diagnosis present

## 2017-05-16 DIAGNOSIS — Z8673 Personal history of transient ischemic attack (TIA), and cerebral infarction without residual deficits: Secondary | ICD-10-CM | POA: Diagnosis present

## 2017-05-16 DIAGNOSIS — E785 Hyperlipidemia, unspecified: Secondary | ICD-10-CM | POA: Diagnosis present

## 2017-05-16 DIAGNOSIS — H5440 Blindness, one eye, unspecified eye: Secondary | ICD-10-CM

## 2017-05-16 DIAGNOSIS — N289 Disorder of kidney and ureter, unspecified: Secondary | ICD-10-CM

## 2017-05-16 DIAGNOSIS — H5462 Unqualified visual loss, left eye, normal vision right eye: Secondary | ICD-10-CM | POA: Diagnosis present

## 2017-05-16 LAB — CBC
HCT: 40.1 % (ref 36.0–46.0)
Hemoglobin: 13 g/dL (ref 12.0–15.0)
MCH: 28.2 pg (ref 26.0–34.0)
MCHC: 32.4 g/dL (ref 30.0–36.0)
MCV: 87 fL (ref 78.0–100.0)
Platelets: 237 10*3/uL (ref 150–400)
RBC: 4.61 MIL/uL (ref 3.87–5.11)
RDW: 15.6 % — ABNORMAL HIGH (ref 11.5–15.5)
WBC: 8.2 10*3/uL (ref 4.0–10.5)

## 2017-05-16 LAB — I-STAT CHEM 8, ED
BUN: 21 mg/dL — ABNORMAL HIGH (ref 6–20)
Calcium, Ion: 1.24 mmol/L (ref 1.15–1.40)
Chloride: 111 mmol/L (ref 101–111)
Creatinine, Ser: 1.6 mg/dL — ABNORMAL HIGH (ref 0.44–1.00)
Glucose, Bld: 72 mg/dL (ref 65–99)
HCT: 40 % (ref 36.0–46.0)
Hemoglobin: 13.6 g/dL (ref 12.0–15.0)
Potassium: 4.3 mmol/L (ref 3.5–5.1)
Sodium: 142 mmol/L (ref 135–145)
TCO2: 25 mmol/L (ref 22–32)

## 2017-05-16 LAB — I-STAT TROPONIN, ED: Troponin i, poc: 0 ng/mL (ref 0.00–0.08)

## 2017-05-16 LAB — DIFFERENTIAL
Basophils Absolute: 0 10*3/uL (ref 0.0–0.1)
Basophils Relative: 0 %
Eosinophils Absolute: 0.1 10*3/uL (ref 0.0–0.7)
Eosinophils Relative: 2 %
Lymphocytes Relative: 30 %
Lymphs Abs: 2.5 10*3/uL (ref 0.7–4.0)
Monocytes Absolute: 0.3 10*3/uL (ref 0.1–1.0)
Monocytes Relative: 4 %
Neutro Abs: 5.3 10*3/uL (ref 1.7–7.7)
Neutrophils Relative %: 64 %

## 2017-05-16 LAB — COMPREHENSIVE METABOLIC PANEL
ALT: 24 U/L (ref 14–54)
AST: 21 U/L (ref 15–41)
Albumin: 4.2 g/dL (ref 3.5–5.0)
Alkaline Phosphatase: 69 U/L (ref 38–126)
Anion gap: 10 (ref 5–15)
BUN: 17 mg/dL (ref 6–20)
CO2: 21 mmol/L — ABNORMAL LOW (ref 22–32)
Calcium: 9.7 mg/dL (ref 8.9–10.3)
Chloride: 107 mmol/L (ref 101–111)
Creatinine, Ser: 1.64 mg/dL — ABNORMAL HIGH (ref 0.44–1.00)
GFR calc Af Amer: 39 mL/min — ABNORMAL LOW (ref >60)
GFR calc non Af Amer: 33 mL/min — ABNORMAL LOW (ref >60)
Glucose, Bld: 78 mg/dL (ref 65–99)
Potassium: 4.1 mmol/L (ref 3.5–5.1)
Sodium: 138 mmol/L (ref 135–145)
Total Bilirubin: 0.6 mg/dL (ref 0.3–1.2)
Total Protein: 7.8 g/dL (ref 6.5–8.1)

## 2017-05-16 LAB — PROTIME-INR
INR: 1.05
Prothrombin Time: 13.6 seconds (ref 11.4–15.2)

## 2017-05-16 LAB — CBG MONITORING, ED: Glucose-Capillary: 72 mg/dL (ref 65–99)

## 2017-05-16 LAB — APTT: aPTT: 28 seconds (ref 24–36)

## 2017-05-16 LAB — I-STAT BETA HCG BLOOD, ED (MC, WL, AP ONLY): I-stat hCG, quantitative: 7.2 m[IU]/mL — ABNORMAL HIGH (ref <5)

## 2017-05-16 MED ORDER — DORZOLAMIDE HCL 2 % OP SOLN
1.0000 [drp] | Freq: Three times a day (TID) | OPHTHALMIC | Status: DC
  Administered 2017-05-16 – 2017-05-19 (×9): 1 [drp] via OPHTHALMIC
  Filled 2017-05-16: qty 10

## 2017-05-16 MED ORDER — BRIMONIDINE TARTRATE 0.2 % OP SOLN
1.0000 [drp] | Freq: Three times a day (TID) | OPHTHALMIC | Status: DC
  Administered 2017-05-16 – 2017-05-19 (×9): 1 [drp] via OPHTHALMIC
  Filled 2017-05-16: qty 5

## 2017-05-16 MED ORDER — TIMOLOL MALEATE 0.5 % OP SOLN
1.0000 [drp] | Freq: Two times a day (BID) | OPHTHALMIC | Status: DC
  Administered 2017-05-16 – 2017-05-19 (×6): 1 [drp] via OPHTHALMIC
  Filled 2017-05-16: qty 5

## 2017-05-16 MED ORDER — ASPIRIN 325 MG PO TABS
325.0000 mg | ORAL_TABLET | Freq: Every day | ORAL | Status: DC
  Administered 2017-05-17 – 2017-05-19 (×3): 325 mg via ORAL
  Filled 2017-05-16 (×3): qty 1

## 2017-05-16 MED ORDER — ENOXAPARIN SODIUM 30 MG/0.3ML ~~LOC~~ SOLN
30.0000 mg | SUBCUTANEOUS | Status: DC
  Administered 2017-05-16: 30 mg via SUBCUTANEOUS
  Filled 2017-05-16: qty 0.3

## 2017-05-16 MED ORDER — ENOXAPARIN SODIUM 30 MG/0.3ML ~~LOC~~ SOLN: 30.0000 mg | SUBCUTANEOUS | Status: DC

## 2017-05-16 MED ORDER — ACETAMINOPHEN 160 MG/5ML PO SOLN: 650.0000 mg | ORAL | Status: DC | PRN

## 2017-05-16 MED ORDER — ACETAMINOPHEN 650 MG RE SUPP: 650.0000 mg | RECTAL | Status: DC | PRN

## 2017-05-16 MED ORDER — STROKE: EARLY STAGES OF RECOVERY BOOK
Freq: Once | Status: AC
  Administered 2017-05-17: 19:00:00
  Filled 2017-05-16: qty 1

## 2017-05-16 MED ORDER — HYDRALAZINE HCL 25 MG PO TABS
25.0000 mg | ORAL_TABLET | Freq: Three times a day (TID) | ORAL | Status: DC
  Administered 2017-05-16 – 2017-05-19 (×9): 25 mg via ORAL
  Filled 2017-05-16 (×9): qty 1

## 2017-05-16 MED ORDER — LABETALOL HCL 100 MG PO TABS
100.0000 mg | ORAL_TABLET | Freq: Three times a day (TID) | ORAL | Status: DC
  Administered 2017-05-16 – 2017-05-19 (×9): 100 mg via ORAL
  Filled 2017-05-16 (×9): qty 1

## 2017-05-16 MED ORDER — SENNOSIDES-DOCUSATE SODIUM 8.6-50 MG PO TABS
1.0000 | ORAL_TABLET | Freq: Every day | ORAL | Status: DC
  Administered 2017-05-16 – 2017-05-18 (×3): 1 via ORAL
  Filled 2017-05-16 (×3): qty 1

## 2017-05-16 MED ORDER — ACETAMINOPHEN 325 MG PO TABS: 650.0000 mg | ORAL_TABLET | ORAL | Status: DC | PRN

## 2017-05-16 MED ORDER — SODIUM CHLORIDE 0.9 % IV SOLN
INTRAVENOUS | Status: DC
  Administered 2017-05-17: 150 mL via INTRAVENOUS
  Administered 2017-05-18 – 2017-05-19 (×3): via INTRAVENOUS

## 2017-05-16 MED ORDER — AMLODIPINE BESYLATE 10 MG PO TABS
10.0000 mg | ORAL_TABLET | Freq: Every day | ORAL | Status: DC
  Administered 2017-05-16 – 2017-05-19 (×4): 10 mg via ORAL
  Filled 2017-05-16 (×2): qty 1
  Filled 2017-05-16: qty 2
  Filled 2017-05-16: qty 1

## 2017-05-16 NOTE — ED Triage Notes (Signed)
Pt to ER for evaluation of left facial droop onset last Friday. No slurred speech, no weakness, patient is ambulatory, BP 192/99, HR 89. Pt is a/o x4. According to Dr. Manuella Ghazi, with ophthalmology, patient may have had an occipital lobe stroke and needs stroke work up. Patient reports difficulty seeing out of right eye onset last Friday as well.

## 2017-05-16 NOTE — H&P (Signed)
History and Physical    Valerie Coleman WJX:[id number] DOB: [birth year] DOA: 05/16/2017  PCP: Scot Jun, FNP) Patient coming from: home   Chief Complaint: facial droop 5 days ago  HPI: Valerie Coleman is a 56 y.o. female with medical history significant of hypertension admitted with right facial droop difficulty swallowing and difficulty speaking 5 days ago.  Patient lives alone.  This was reported by her daughter.  Patient saw her ophthalmologist today patient has chronic right retinal vein occlusion and has lost sight in the left eye.  At this time patient denies any complaints complaints of headache she has chronic left eye vision loss the ophthalmologist thought she has some vertical cut in her right eye.  Patient so patient was sent to the ER from the ophthalmology office.Remote right PCA territory infarct.  No acute abnormality  Mild atrophy and chronic microvascular ischemic change.mri pending.  Review of Systems: As per HPI otherwise 10 point review of systems negative.   Past Medical History:  Diagnosis Date  . Hypertension     Past Surgical History:  Procedure Laterality Date  . CHOLECYSTECTOMY    . TUBAL LIGATION       reports that she has quit smoking. she has never used smokeless tobacco. She reports that she does not drink alcohol or use drugs.  No Known Allergies  History reviewed. No pertinent family history. Unacceptable: Noncontributory, unremarkable, or negative. Acceptable: Family history reviewed and not pertinent (If you reviewed it)  Prior to Admission medications   Medication Sig Start Date End Date Taking? Authorizing Provider  amLODipine (NORVASC) 10 MG tablet Take 1 tablet (10 mg total) by mouth daily. 01/01/17  Yes Scot Jun, FNP  hydrALAZINE (APRESOLINE) 25 MG tablet Take 1 tablet (25 mg total) by mouth 3 (three) times daily. 01/04/17  Yes Scot Jun, FNP  labetalol (NORMODYNE) 200 MG tablet TAKE ONE (1) TABLET THREE (3) TIMES  EACH DAY 04/23/17  Yes Scot Jun, FNP  Blood Pressure KIT Check blood pressure at morning and before bed. 10/05/16   Scot Jun, FNP  brimonidine (ALPHAGAN) 0.2 % ophthalmic solution Place 1 drop into the left eye 3 (three) times daily. Patient not taking: Reported on 10/05/2016 12/28/15   Domenic Polite, MD  dorzolamide (TRUSOPT) 2 % ophthalmic solution Place 1 drop into the left eye 3 (three) times daily. Patient not taking: Reported on 10/05/2016 12/28/15   Domenic Polite, MD  potassium chloride (K-DUR) 10 MEQ tablet Take 1 tablet (10 mEq total) by mouth daily. Patient not taking: Reported on 05/16/2017 01/01/17   Scot Jun, FNP  sertraline (ZOLOFT) 25 MG tablet Take 1 tablet (25 mg total) by mouth daily. Patient not taking: Reported on 05/16/2017 01/01/17   Scot Jun, FNP  timolol (TIMOPTIC) 0.5 % ophthalmic solution Place 1 drop into the left eye 2 (two) times daily. Patient not taking: Reported on 10/05/2016 12/28/15   Domenic Polite, MD  tobramycin (TOBREX) 0.3 % ophthalmic solution Place 1 drop into the left eye every 6 (six) hours. Place one drop in the affected eye every 4 hours for 7 days Patient not taking: Reported on 10/05/2016 03/20/16   Margarita Mail, PA-C    Physical Exam: Vitals:   05/16/17 1730 05/16/17 1745 05/16/17 1800 05/16/17 1830  BP: (!) 171/112 (!) 167/105 (!) 170/101 (!) 162/100  Pulse: 71 76 75 74  Resp: 13 (!) _0 Temp:      SpO2: 100% 99% 99% 98%  Constitutional: NAD, calm, comfortable Vitals:   05/16/17 1730 05/16/17 1745 05/16/17 1800 05/16/17 1830  BP: (!) 171/112 (!) 167/105 (!) 170/101 (!) 162/100  Pulse: 71 76 75 74  Resp: 13 (!) _0 Temp:      SpO2: 100% 99% 99% 98%   Eyes:  Left eye blindENMT: Mucous membranes are moist. Posterior pharynx clear of any exudate or lesions.Normal dentition.  Neck: normal, supple, no masses, no thyromegaly Respiratory: clear to auscultation bilaterally, no wheezing, no  crackles. Normal respiratory effort. No accessory muscle use.  Cardiovascular: Regular rate and rhythm, no murmurs / rubs / gallops. No extremity edema. 2+ pedal pulses. No carotid bruits.  Abdomen: no tenderness, no masses palpated. No hepatosplenomegaly. Bowel sounds positive.  Musculoskeletal: no clubbing / cyanosis. No joint deformity upper and lower extremities. Good ROM, no contractures. Normal muscle tone.  Skin: no rashes, lesions, ulcers. No induration Neurologic: CN 2-12 grossly intact. Sensation intact, DTR normal. Strength 5/5 in all 4.  Psychiatric: Normal judgment and insight. Alert and oriented x 3. Normal mood.   (Anything < 9 systems with 2 bullets each down codes to level 1) (If patient refuses exam can't bill higher level) (Make sure to document decubitus ulcers present on admission -- if possible -- and whether patient has chronic indwelling catheter at time of admission)  Labs on Admission: I have personally reviewed following labs and imaging studies  CBC: Recent Labs  Lab 05/16/17 1153 05/16/17 1238  WBC 8.2  --   NEUTROABS 5.3  --   HGB 13.0 13.6  HCT 40.1 40.0  MCV 87.0  --   PLT 237  --    Basic Metabolic Panel: Recent Labs  Lab 05/16/17 1153 05/16/17 1238  NA 138 142  K 4.1 4.3  CL 107 111  CO2 21*  --   GLUCOSE 78 72  BUN 17 21*  CREATININE 1.64* 1.60*  CALCIUM 9.7  --    GFR: CrCl cannot be calculated (Unknown ideal weight.). Liver Function Tests: Recent Labs  Lab 05/16/17 1153  AST 21  ALT 24  ALKPHOS 69  BILITOT 0.6  PROT 7.8  ALBUMIN 4.2   No results for input(s): LIPASE, AMYLASE in the last 168 hours. No results for input(s): AMMONIA in the last 168 hours. Coagulation Profile: Recent Labs  Lab 05/16/17 1153  INR 1.05   Cardiac Enzymes: No results for input(s): CKTOTAL, CKMB, CKMBINDEX, TROPONINI in the last 168 hours. BNP (last 3 results) No results for input(s): PROBNP in the last 8760 hours. HbA1C: No results for  input(s): HGBA1C in the last 72 hours. CBG: Recent Labs  Lab 05/16/17 1159  GLUCAP 72   Lipid Profile: No results for input(s): CHOL, HDL, LDLCALC, TRIG, CHOLHDL, LDLDIRECT in the last 72 hours. Thyroid Function Tests: No results for input(s): TSH, T4TOTAL, FREET4, T3FREE, THYROIDAB in the last 72 hours. Anemia Panel: No results for input(s): VITAMINB12, FOLATE, FERRITIN, TIBC, IRON, RETICCTPCT in the last 72 hours. Urine analysis:    Component Value Date/Time   LABSPEC <=1.005 01/01/2017 1531   PHURINE 5.5 01/01/2017 1531   GLUCOSEU NEGATIVE 01/01/2017 1531   HGBUR TRACE (A) 01/01/2017 1531   BILIRUBINUR NEGATIVE 01/01/2017 1531   KETONESUR NEGATIVE 01/01/2017 1531   PROTEINUR 100 (A) 01/01/2017 1531   UROBILINOGEN 0.2 01/01/2017 1531   NITRITE NEGATIVE 01/01/2017 1531   LEUKOCYTESUR SMALL (A) 01/01/2017 1531    Radiological Exams on Admission: Ct Head Wo Contrast  Result Date: 05/16/2017 CLINICAL DATA:  Possible stroke this morning based on an eye exam. EXAM: CT HEAD WITHOUT CONTRAST TECHNIQUE: Contiguous axial images were obtained from the base of the skull through the vertex without intravenous contrast. COMPARISON:  None. FINDINGS: Brain: The patient has a remote appearing right PCA territory infarct. There is some chronic microvascular ischemic change and mild atrophy. No evidence of acute abnormality including hemorrhage, mass lesion, midline shift or abnormal extra-axial fluid collection. No hydrocephalus or pneumocephalus. Vascular: No hyperdense vessel or unexpected calcification. Skull: Intact. Sinuses/Orbits: Negative. Other: None. IMPRESSION: Remote right PCA territory infarct.  No acute abnormality. Mild atrophy and chronic microvascular ischemic change. Electronically Signed   By: Inge Rise M.D.   On: 05/16/2017 12:17    EKG: Independently reviewed  Assessment/Plan Active Problems:   CVA (cerebral vascular accident) ([place])   Sub acute cva -follow up  [facility] consult done from er.echo,carotid ultrasound. Uncontrolled htn-restart all 3 home meds.  DVT prophylaxislovenox Code Status:full  Family Communication:none Disposition Plan tbd Consults called: neuro Admission status: in patient Georgette Shell MD [facility]  If 7PM-7AM, please contact night-coverage [email or web address] Password [email or web address]  05/16/2017, 6:45 PM

## 2017-05-16 NOTE — ED Notes (Signed)
Delay in lab draw,  Pt in MRI at this time.

## 2017-05-16 NOTE — ED Provider Notes (Signed)
[facility] EMERGENCY DEPARTMENT Provider Note   CSN: [id number] Arrival date & time: 05/16/17  1123     History   Chief Complaint Chief Complaint  Patient presents with  . Stroke Symptoms    HPI Alliana Mcauliff is a 56 y.o. female.  The history is provided by the patient. No language interpreter was used.    Beatris Belen is a 56 y.o. female who presents to the Emergency Department complaining of stroke symptoms.  She was referred to the emergency department today from her ophthalmologist due to concern for stroke.  History is provided by the patient and her daughter.  The patient denies any current complaints but her daughter notes that since last Thursday the patient has experienced right-sided facial droop with drooling from the right side of her mouth as well as difficulty with swallowing and choking at times and slurred speech.  The daughter states that overall the symptoms are improving.  The patient today saw her ophthalmologist, Dr. Manuella Ghazi and he noted that she had a new visual field deficit respecting the vertical midline concerning for an occipital lobe stroke.  She denies any headaches, nausea, vomiting, chest pain, fevers, abdominal pain, numbness, weakness.  She does have history of hypertension but did not take her blood pressure medication today due to appointments.    Past Medical History:  Diagnosis Date  . Hypertension     Patient Active Problem List   Diagnosis Date Noted  . CKD (chronic kidney disease) stage 3, GFR 30-59 ml/min (HCC) 01/04/2017  . Central retinal vein occlusion   . Hypertensive emergency 12/26/2015    Past Surgical History:  Procedure Laterality Date  . CHOLECYSTECTOMY    . TUBAL LIGATION      OB History    No data available       Home Medications    Prior to Admission medications   Medication Sig Start Date End Date Taking? Authorizing Provider  amLODipine (NORVASC) 10 MG tablet Take 1 tablet (10 mg total) by  mouth daily. 01/01/17   Scot Jun, FNP  Blood Pressure KIT Check blood pressure at morning and before bed. Patient not taking: Reported on 01/01/2017 10/05/16   Scot Jun, FNP  brimonidine [facility]) 0.2 % ophthalmic solution Place 1 drop into the left eye 3 (three) times daily. Patient not taking: Reported on 10/05/2016 12/28/15   Domenic Polite, MD  dorzolamide (TRUSOPT) 2 % ophthalmic solution Place 1 drop into the left eye 3 (three) times daily. Patient not taking: Reported on 10/05/2016 12/28/15   Domenic Polite, MD  hydrALAZINE (APRESOLINE) 25 MG tablet Take 1 tablet (25 mg total) by mouth 3 (three) times daily. 01/04/17   Scot Jun, FNP  labetalol (NORMODYNE) 200 MG tablet TAKE ONE (1) TABLET THREE (3) TIMES EACH DAY 04/23/17   Scot Jun, FNP  potassium chloride (K-DUR) 10 MEQ tablet Take 1 tablet (10 mEq total) by mouth daily. 01/01/17   Scot Jun, FNP  sertraline (ZOLOFT) 25 MG tablet Take 1 tablet (25 mg total) by mouth daily. 01/01/17   Scot Jun, FNP  timolol (TIMOPTIC) 0.5 % ophthalmic solution Place 1 drop into the left eye 2 (two) times daily. Patient not taking: Reported on 10/05/2016 12/28/15   Domenic Polite, MD  tobramycin (TOBREX) 0.3 % ophthalmic solution Place 1 drop into the left eye every 6 (six) hours. Place one drop in the affected eye every 4 hours for 7 days Patient not taking: Reported on  10/05/2016 03/20/16   Margarita Mail, PA-C    Family History History reviewed. No pertinent family history.  Social History Social History   Tobacco Use  . Smoking status: Former Research scientist (life sciences)  . Smokeless tobacco: Never Used  Substance Use Topics  . Alcohol use: No  . Drug use: No     Allergies   Patient has no known allergies.   Review of Systems Review of Systems  All other systems reviewed and are negative.    Physical Exam Updated Vital Signs BP (!) 192/99 (BP Location: Right Arm)   Pulse 90   Resp 18   SpO2 100%    Physical Exam  Constitutional: She is oriented to person, place, and time. She appears well-developed and well-nourished.  HENT:  Head: Normocephalic and atraumatic.  Cardiovascular: Normal rate and regular rhythm.  No murmur heard. Pulmonary/Chest: Effort normal and breath sounds normal. No respiratory distress.  Abdominal: Soft. There is no tenderness. There is no rebound and no guarding.  Musculoskeletal: She exhibits no edema or tenderness.  Neurological: She is alert and oriented to person, place, and time.  EOMI.  Sensation to light touch intact throughout face, all 4 extremities.  Cataract over the left eye that is midsize and reactive to light.  No vision in the left eye.  No pronator drift.  5 out of 5 strength in all 4 extremities.  Skin: Skin is warm and dry.  Psychiatric: She has a normal mood and affect. Her behavior is normal.  Nursing note and vitals reviewed.    ED Treatments / Results  Labs (all labs ordered are listed, but only abnormal results are displayed) Labs Reviewed  CBC - Abnormal; Notable for the following components:      Result Value   RDW 15.6 (*)    All other components within normal limits  COMPREHENSIVE METABOLIC PANEL - Abnormal; Notable for the following components:   CO2 21 (*)    Creatinine, Ser 1.64 (*)    GFR calc non Af Amer 33 (*)    GFR calc Af Amer 39 (*)    All other components within normal limits  I-STAT CHEM 8, ED - Abnormal; Notable for the following components:   BUN 21 (*)    Creatinine, Ser 1.60 (*)    All other components within normal limits  I-STAT BETA HCG BLOOD, ED ([facility], [facility], [facility] ONLY) - Abnormal; Notable for the following components:   I-stat hCG, quantitative 7.2 (*)    All other components within normal limits  PROTIME-INR  APTT  DIFFERENTIAL  I-STAT TROPONIN, ED  CBG MONITORING, ED    EKG  EKG Interpretation None       Radiology Ct Head Wo Contrast  Result Date: 05/16/2017 CLINICAL DATA:  Possible  stroke this morning based on an eye exam. EXAM: CT HEAD WITHOUT CONTRAST TECHNIQUE: Contiguous axial images were obtained from the base of the skull through the vertex without intravenous contrast. COMPARISON:  None. FINDINGS: Brain: The patient has a remote appearing right PCA territory infarct. There is some chronic microvascular ischemic change and mild atrophy. No evidence of acute abnormality including hemorrhage, mass lesion, midline shift or abnormal extra-axial fluid collection. No hydrocephalus or pneumocephalus. Vascular: No hyperdense vessel or unexpected calcification. Skull: Intact. Sinuses/Orbits: Negative. Other: None. IMPRESSION: Remote right PCA territory infarct.  No acute abnormality. Mild atrophy and chronic microvascular ischemic change. Electronically Signed   By: Inge Rise M.D.   On: 05/16/2017 12:17    Procedures Procedures (including  critical care time)  Medications Ordered in ED Medications - No data to display   Initial Impression / Assessment and Plan / ED Course  I have reviewed the triage vital signs and the nursing notes.  Pertinent labs & imaging results that were available during my care of the patient were reviewed by me and considered in my medical decision making (see chart for details).     Patient with history of hypertension and CKD here for evaluation of facial weakness as well as vision changes.  She is asymptomatic in the emergency department.  CT scan of her brain does demonstrate evidence of remote infarcts.  Plan to obtain MRI and admit for further testing for CVA.  Neurologist and hospitalist consulted for further treatment.  Patient updated of findings of studies and is in agreement with plan for admission.  She is not a TPA candidate given duration of symptoms.  Final Clinical Impressions(s) / ED Diagnoses   Final diagnoses:  None    ED Discharge Orders    None       Quintella Reichert, MD 05/17/17 [phone]

## 2017-05-16 NOTE — ED Notes (Signed)
Per MD E. Zigmund Daniel, Cardiac Diet for pt.

## 2017-05-16 NOTE — ED Notes (Signed)
Patient transported to MRI 

## 2017-05-16 NOTE — Consult Note (Signed)
Requesting Physician: Dr. Ralene Bathe    Chief Complaint: Vision abnormality  History obtained from:  Patient   Chart    HPI:                                                                                                                                       Valerie Coleman is an 56 y.o. female past history of uncontrolled hypertension who presents following appointment with ophthalmologist detected right eye visual field deficit. Apparently this was not present on her ophthalmological examination 6 weeks ago according to the patient. 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 CT head done in the emergency room shows chronic right occipital lobe stroke.  The patient denies any current complaints but her daughter notes that since last Thursday the patient has experienced right-sided facial droop with drooling from the right side of her mouth as well as difficulty with swallowing and choking at times and slurred speech.  Patient was admitted to medicine team and Neurology was consulted.    Past Medical History:  Diagnosis Date  . Hypertension     Past Surgical History:  Procedure Laterality Date  . CHOLECYSTECTOMY    . TUBAL LIGATION      History reviewed. No pertinent family history. Social History:  reports that she has quit smoking. she has never used smokeless tobacco. She reports that she does not drink alcohol or use drugs.  Allergies: No Known Allergies  Medications:                                                                                                                        I reviewed home medications.   ROS:  14 systems reviewed and negative except above    Examination:                                                                                                      General: Appears well-developed and well-nourished.   Psych: Affect appropriate to situation Eyes: No scleral injection HENT: No OP obstrucion Head: Normocephalic.  Cardiovascular: Normal rate and regular rhythm.  Respiratory: Effort normal and breath sounds normal to anterior ascultation GI: Soft.  No distension. There is no tenderness.  Skin: WDI   Neurological Examination Mental Status: Alert, oriented, thought content appropriate.  Speech fluent without evidence of aphasia.  Able to follow 3 step commands without difficulty. 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 III,IV, VI: ptosis not present, extra-ocular motions intact bilaterally, pupils equal, round, reactive to light and accommodation V,VII: smile symmetric, facial light touch sensation normal bilaterally VIII: hearing normal bilaterally IX,X: uvula rises symmetrically XI: bilateral shoulder shrug XII: midline tongue extension Motor: Right : Upper extremity   5/5    Left:     Upper extremity   5/5  Lower extremity   5/5     Lower extremity   5/5 Tone and bulk:normal tone throughout; no atrophy noted Sensory: Pinprick and light touch intact throughout, bilaterally Deep Tendon Reflexes: 2+ and symmetric throughout Plantars: Right: downgoing   Left: downgoing Cerebellar: normal finger-to-nose, normal rapid alternating movements and normal heel-to-shin test Gait: normal gait and station     Lab Results: Basic Metabolic Panel: Recent Labs  Lab 05/16/17 1153 05/16/17 1238  NA 138 142  K 4.1 4.3  CL 107 111  CO2 21*  --   GLUCOSE 78 72  BUN 17 21*  CREATININE 1.64* 1.60*  CALCIUM 9.7  --     CBC: Recent Labs  Lab 05/16/17 1153 05/16/17 1238  WBC 8.2  --   NEUTROABS 5.3  --   HGB 13.0 13.6  HCT 40.1 40.0  MCV 87.0  --   PLT 237  --     Coagulation Studies: Recent Labs    05/16/17 1153  LABPROT 13.6  INR 1.05    Imaging: Ct Head Wo Contrast  Result Date: 05/16/2017 CLINICAL  DATA:  Possible stroke this morning based on an eye exam. EXAM: CT HEAD WITHOUT CONTRAST TECHNIQUE: Contiguous axial images were obtained from the base of the skull through the vertex without intravenous contrast. COMPARISON:  None. FINDINGS: Brain: The patient has a remote appearing right PCA territory infarct. There is some chronic microvascular ischemic change and mild atrophy. No evidence of acute abnormality including hemorrhage, mass lesion, midline shift or abnormal extra-axial fluid collection. No hydrocephalus or pneumocephalus. Vascular: No hyperdense vessel or unexpected calcification. Skull: Intact. Sinuses/Orbits: Negative. Other: None. IMPRESSION: Remote right PCA territory infarct.  No acute abnormality. Mild atrophy and chronic microvascular ischemic change. Electronically Signed   By: Inge Rise M.D.   On: 05/16/2017 12:17     ASSESSMENT AND PLAN   Chronic to subacute right PCA infarct,  Possible  acute stroke/TIA Etiology: Cardioembolic, atheroembolic versus PRES   Recommend # MRI of the brain without contrast #MRA Head and neck  #Transthoracic Echo  # Start patient on ASA 325mg  #Start or continue Atorvastatin 80 mg/other high intensity statin # BP goal: normotension, please continue home meds.   # HBAIC and Lipid profile # Telemetry monitoring # Frequent neuro checks   Please page stroke NP  Or  PA  Or MD from 8am -4 pm  as this patient from this time will be  followed by the stroke.   You can look them up on [email or web address]  Password [facility]   Sushanth Aroor [facility] Pager Number [phone]

## 2017-05-17 ENCOUNTER — Inpatient Hospital Stay (HOSPITAL_COMMUNITY): Payer: Medicaid Other

## 2017-05-17 DIAGNOSIS — E785 Hyperlipidemia, unspecified: Secondary | ICD-10-CM

## 2017-05-17 DIAGNOSIS — H5440 Blindness, one eye, unspecified eye: Secondary | ICD-10-CM

## 2017-05-17 DIAGNOSIS — I63311 Cerebral infarction due to thrombosis of right middle cerebral artery: Secondary | ICD-10-CM

## 2017-05-17 DIAGNOSIS — I63331 Cerebral infarction due to thrombosis of right posterior cerebral artery: Secondary | ICD-10-CM

## 2017-05-17 DIAGNOSIS — I10 Essential (primary) hypertension: Secondary | ICD-10-CM

## 2017-05-17 DIAGNOSIS — H409 Unspecified glaucoma: Secondary | ICD-10-CM

## 2017-05-17 LAB — HEMOGLOBIN A1C
Hgb A1c MFr Bld: 5.3 % (ref 4.8–5.6)
Mean Plasma Glucose: 105.41 mg/dL

## 2017-05-17 LAB — LIPID PANEL
Cholesterol: 304 mg/dL — ABNORMAL HIGH (ref 0–200)
HDL: 48 mg/dL (ref >40)
LDL Cholesterol: 212 mg/dL — ABNORMAL HIGH (ref 0–99)
Total CHOL/HDL Ratio: 6.3 RATIO
Triglycerides: 220 mg/dL — ABNORMAL HIGH (ref <150)
VLDL: 44 mg/dL — ABNORMAL HIGH (ref 0–40)

## 2017-05-17 LAB — ECHOCARDIOGRAM COMPLETE
Height: 72 in
Weight: 3167.57 oz

## 2017-05-17 LAB — HIV ANTIBODY (ROUTINE TESTING W REFLEX): HIV Screen 4th Generation wRfx: NONREACTIVE

## 2017-05-17 LAB — COMPREHENSIVE METABOLIC PANEL
ALT: 20 U/L (ref 14–54)
AST: 19 U/L (ref 15–41)
Albumin: 3.8 g/dL (ref 3.5–5.0)
Alkaline Phosphatase: 66 U/L (ref 38–126)
Anion gap: 10 (ref 5–15)
BUN: 17 mg/dL (ref 6–20)
CO2: 23 mmol/L (ref 22–32)
Calcium: 9.5 mg/dL (ref 8.9–10.3)
Chloride: 106 mmol/L (ref 101–111)
Creatinine, Ser: 1.48 mg/dL — ABNORMAL HIGH (ref 0.44–1.00)
GFR calc Af Amer: 44 mL/min — ABNORMAL LOW (ref >60)
GFR calc non Af Amer: 38 mL/min — ABNORMAL LOW (ref >60)
Glucose, Bld: 81 mg/dL (ref 65–99)
Potassium: 3.9 mmol/L (ref 3.5–5.1)
Sodium: 139 mmol/L (ref 135–145)
Total Bilirubin: 0.6 mg/dL (ref 0.3–1.2)
Total Protein: 7 g/dL (ref 6.5–8.1)

## 2017-05-17 MED ORDER — ATORVASTATIN CALCIUM 80 MG PO TABS
80.0000 mg | ORAL_TABLET | Freq: Every day | ORAL | Status: DC
  Administered 2017-05-17 – 2017-05-19 (×3): 80 mg via ORAL
  Filled 2017-05-17 (×3): qty 1

## 2017-05-17 MED ORDER — ENOXAPARIN SODIUM 40 MG/0.4ML ~~LOC~~ SOLN
40.0000 mg | SUBCUTANEOUS | Status: DC
  Administered 2017-05-17 – 2017-05-18 (×2): 40 mg via SUBCUTANEOUS
  Filled 2017-05-17 (×2): qty 0.4

## 2017-05-17 NOTE — Plan of Care (Signed)
Began stroke education

## 2017-05-17 NOTE — Progress Notes (Signed)
NURSING PROGRESS NOTE  Valerie Coleman [id number] Admission Data: 05/17/2017 1:28 AM Attending Provider: Georgette Shell, MD ERD:[id number], Carroll Sage, FNP Code Status: Full  Valerie Coleman is a 56 y.o. female patient admitted from ED:  -No acute distress noted.  -No complaints of shortness of breath.  -No complaints of chest pain.   Cardiac Monitoring: Box # 28 in place. Cardiac monitor yields:normal sinus rhythm.  Blood pressure (!) 171/78, pulse 72, temperature 98.5 F (36.9 C), temperature source Oral, resp. rate 18, height 6' (1.829 m), weight 89.8 kg (197 lb 15.6 oz), SpO2 100 %.   IV Fluids:  IV in place, occlusive dsg intact without redness, IV cath forearm right, condition patent and no redness and antecubital right, condition patent and no redness none.   Allergies:  Patient has no known allergies.  Past Medical History:   has a past medical history of Hypertension.  Past Surgical History:   has a past surgical history that includes Cholecystectomy and Tubal ligation.  Social History:   reports that she has quit smoking. she has never used smokeless tobacco. She reports that she does not drink alcohol or use drugs.  Skin: clean, dry, and intact. Flaky  Patient/Family orientated to room. Information packet given to patient/family. Admission inpatient armband information verified with patient/family to include name and date of birth and placed on patient arm. Side rails up x 2, fall assessment and education completed with patient/family. Patient/family able to verbalize understanding of risk associated with falls and verbalized understanding to call for assistance before getting out of bed. Call light within reach. Patient/family able to voice and demonstrate understanding of unit orientation instructions.    Will continue to evaluate and treat per MD orders.

## 2017-05-17 NOTE — Progress Notes (Signed)
CSW acknowledging consult for medication assistance. CSW alerted RNCM.  CSW signing off. Please consult if CSW need arises.  Laveda Abbe, [place] Clinical Social Worker [phone]

## 2017-05-17 NOTE — Progress Notes (Signed)
STROKE TEAM PROGRESS NOTE   SUBJECTIVE (INTERVAL HISTORY) No family  is at the bedside. Patient is found laying in bed in NAD  Overall she feels her condition is unchanged. Voices no new complaints. No new events reported overnight.  OBJECTIVE Lab Results: CBC:  Recent Labs  Lab 05/16/17 1153 05/16/17 1238  WBC 8.2  --   HGB 13.0 13.6  HCT 40.1 40.0  MCV 87.0  --   PLT 237  --    BMP: Recent Labs  Lab 05/16/17 1153 05/16/17 1238 05/17/17 0316  NA 138 142 139  K 4.1 4.3 3.9  CL 107 111 106  CO2 21*  --  23  GLUCOSE 78 72 81  BUN 17 21* 17  CREATININE 1.64* 1.60* 1.48*  CALCIUM 9.7  --  9.5   Liver Function Tests:  Recent Labs  Lab 05/16/17 1153 05/17/17 0316  AST 21 19  ALT 24 20  ALKPHOS 69 66  BILITOT 0.6 0.6  PROT 7.8 7.0  ALBUMIN 4.2 3.8   Coagulation Studies:  Recent Labs    05/16/17 1153  APTT 28  INR 1.05    PHYSICAL EXAM Temp:  [97.7 F (36.5 C)-98.5 F (36.9 C)] 97.7 F (36.5 C) (12/06 0530) Pulse Rate:  [71-84] 76 (12/06 0530) Resp:  [13-21] 18 (12/06 0530) BP: (116-171)/(76-112) 116/76 (12/06 0530) SpO2:  [98 %-100 %] 100 % (12/06 0530) Weight:  [89.8 kg (197 lb 15.6 oz)] 89.8 kg (197 lb 15.6 oz) (12/05 2133) General - Well nourished, well developed, in no apparent distress Respiratory - Lungs clear bilaterally. No wheezing. Cardiovascular - Regular rate and rhythm  Neurological Examination Mental Status: Alert, oriented, thought content appropriate.  Speech fluent without evidence of aphasia.  Able to follow 3 step commands without difficulty. Cranial Nerves: II: Visual fields : Left eye blind, right eye : + right temporal quadrantanopsia III,IV, VI: ptosis not present, extra-ocular motions intact bilaterally, pupils equal, round, reactive to light and accommodation V,VII: smile asymmetric- slight Left facial droop ?Baseline finding, facial light touch sensation normal bilaterally VIII: hearing normal bilaterally IX,X: uvula rises  symmetrically XI: bilateral shoulder shrug XII: midline tongue extension Motor: Right :  Upper extremity   5/5                                      Left:     Upper extremity   5/5             Lower extremity   5/5                                                  Lower extremity   5/5 Tone and bulk:normal tone throughout; no atrophy noted Sensory: Pinprick and light touch intact throughout, bilaterally Deep Tendon Reflexes: 2+ and symmetric throughout Plantars: Right: downgoing                                Left: downgoing Cerebellar: normal finger-to-nose, normal rapid alternating movements and normal heel-to-shin test Gait: normal gait and station   IMAGING: I have personally reviewed the radiological images below and agree with the radiology interpretations. Ct Head Wo Contrast Result Date: 05/16/2017 IMPRESSION: Remote right  PCA territory infarct.  No acute abnormality. Mild atrophy and chronic microvascular ischemic change.   Mr Brain Wo Contrast Result Date: 05/16/2017 IMPRESSION: 1. Acute RIGHT caudate head lacunar infarct.  Subacute RIGHT thalamus/internal capsule infarct.  Old RIGHT occipital lobe/PCA territory infarct.  Multiple supratentorial and infratentorial old small vessel infarcts. 3. Mild parenchymal brain volume loss for age. 4. Abnormal signal LEFT ocular globe most consistent with acute to subacute blood products. Atrophic LEFT optic nerve seen with blindness. Recommend correlation with recent ophthalmologic examination. 5. 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 Differential diagnosis includes macroadenoma or, inflammatory process such as sarcoidosis with infundibular involvement. Recommend MRI of the sella with contrast on non motion stasis.   Echocardiogram:  Study Conclusions - Left ventricle: The cavity size was normal. There was mild focal   basal hypertrophy of the septum. Systolic function was normal.   The estimated ejection fraction was in  the range of 55% to 60%.   Wall motion was normal; there were no regional wall motion   abnormalities. Doppler parameters are consistent with abnormal   left ventricular relaxation (grade 1 diastolic dysfunction  B/L Carotid U/S:                                               REPORT PENDING History per PCP records of [facility]. No show for outpatient Carotid U/S _____________________________________________________________________ ASSESSMENT: [name] is a 56 y.o. female with PMH of prior CVA, Hx of uncontrolled HTN, medication and medical advice non-compliance who presents following appointment with ophthalmologist. He detected right eye visual field deficit, not present on her ophthalmological examination 6 weeks ago. 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 CT head done in the emergency room shows chronic right occipital lobe stroke. Admission complaints: The patient denies any current complaints  Daughter notes that since last Thursday the patient has experienced right-sided facial droop with drooling from the right side of her mouth as well as difficulty with swallowing and choking at times and slurred speech. MRI reveals:  Acute RIGHT caudate head lacunar infarct.  Subacute RIGHT thalamus/internal capsule infarct.  Old RIGHT occipital lobe/PCA territory infarct.  Multiple supratentorial and infratentorial old small vessel infarcts. 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  STROKE:  Suspected Etiology: small vessel disease vs cardioembolic vs Right carotid stenosis  Resultant Symptoms:  Right temporal quadrantanopsia and Left facial droop- ? baseline Stroke Risk Factors: hyperlipidemia and hypertension Other Stroke Risk Factors: Advanced age, Hx stroke  Outstanding Stroke Work-up Studies: B/L Carotid U/S:                                                      REPORT PENDING CTA Head/Neck and MRI w/contrast                      PENDING  05/18/17  PLAN   05/17/2017: Continue Aspirin/ Statin CTA Head/Neck and MRI w/contrast in AM if creatinine improved to assess sella mass Ongoing aggressive stroke risk factor management Patient counseled to be compliant with her antithrombotic medications PT/OT/SLP Follow up with [facility] in 6 weeks  HX OF STROKES: Remote right  PCA territory infarct.  Chronic Left optic nerve occlusion, Baseline Findings:  Left eye blindness   Dysphagia Reported by daughter NPO until passes SLP evaluation  CKD: Admission creatinine 1.64> 1.48 today Gentle IV hydration Repeat AM labs  HYPERTENSION: Stable, some elevated B/P's noted over night Permissive hypertension (OK if <220/120) for 24-48 hours post stroke and then gradually normalized within 5-7 days. Long term BP goal normotensive. May slowly restart home B/P medications after 48 hours Home Meds: Norvasc, Apresoline, Labetalol  HYPERLIPIDEMIA:    Component Value Date/Time   CHOL 304 (H) 05/17/2017 0316   TRIG 220 (H) 05/17/2017 0316   HDL 48 05/17/2017 0316   CHOLHDL 6.3 05/17/2017 0316   VLDL 44 (H) 05/17/2017 0316   LDLCALC 212 (H) 05/17/2017 0316  Home Meds:  NONE LDL  goal < 70 Started on  Lipitor to 80 mg daily Continue statin at discharge  DIABETES: Lab Results  Component Value Date   HGBA1C 5.3 05/17/2017  HgbA1c goal < 7.0  Other Active Problems: Active Problems:   CVA (cerebral vascular accident) [facility])  Hospital day # 1  VTE prophylaxis: Lovenox  Diet : Diet Heart Room service appropriate? Yes; Fluid consistency: Thin   Prior Home Stroke Medications: No antithrombotic  Stroke New Meds Plan: Now on aspirin 325 mg daily and Lipitor 80 mg  Discharge Stroke Meds: Please discharge patient on  aspirin 325 mg daily and Lipitor 80 mg   Disposition: 01-Home or Self Care Therapy Recs:  PENDING Follow Recs:  Follow-up Information    Rosalin Hawking, MD. Schedule an appointment as soon as possible for a visit in 6  week(s).   Specialty:  Neurology Contact information: [address] [address] [phone]          Scot Jun, [place]- PCP in 1-2 weeks  FAMILY UPDATES: No family at bedside  TEAM UPDATES: Georgette Shell, MD  [facility] Stroke Neurology Team 05/17/2017 1:15 PM  Attending note: I reviewed above note and agree with the assessment and plan. I have made any additions or clarifications directly to the above note. Pt was seen and examined.   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  Patient did not notice any visual complaints, but stated that while she was treated for right eye glaucoma, her ophthalmologist concerning for stroke and asked her to come to ER for evaluation.  Apparently, according to notes, she was found to have new right eye visual field deficit which was not there 6 weeks ago.   She did admit that she had left facial droop, slurred speech, difficulty swallowing last Friday and Sunday, but now resolved.  On examination, she had left eye completely blind, pupil dilated, corneal clouding, no pupillary reflex.  Right eye left lower quadrantanopia.  Extraocular movement intact.  No other neuro deficit at this time.  CT showed right occipital chronic infarct.  MRI showed right acute CR/BG infarct, old right occipital, pontine, right BG infarcts.  LDL 212, A1c 5.3.  Her stroke likely due to small vessel disease, in the setting of several uncontrolled risk factors including HTN, HLD, CKD, history of coronary infarcts.  On aspirin and high-dose Lipitor.  The current acute right CR/BG stroke likely to explain her symptoms last Friday and Sunday of left facial droop, slurred speech and difficulty swallowing.  However, her right left lower quadrantanopsia likely  related to her right occipital old infarct on CT  and MRI.  Not sure if this happened within the last 6 weeks or chronic not recognized by her ophthalmologist.  For further workup, would like to have a CT head and neck.  Her creatinine this morning 1.48 down from 1.64 yesterday, would like to continue IV hydration and p.o. intake, check creatinine in the morning, if normalized, will proceed with CT head and neck.  In addition, MRI showed 10 mm of sellar mass tenting to optic chiasm.  Although her left lower quadrantanopsia not consistent with optic chiasm compression by sellar mass, still would like to further workup with MRI of the sellar with contrast.  Pending creatinine in the morning.  Rosalin Hawking, MD PhD Stroke Neurology 05/17/2017 4:44 PM    To contact Stroke Continuity provider, please refer to [email or web address] After hours, contact General Neurology

## 2017-05-17 NOTE — Progress Notes (Signed)
PROGRESS NOTE    Valerie Coleman  PJA:[id number] DOB: [birth year] DOA: 05/16/2017 PCP: Scot Jun, FNP  Brief Narrative:56 y.o. female with medical history significant of hypertension admitted with right facial droop difficulty swallowing and difficulty speaking 5 days ago.  Patient lives alone.  This was reported by her daughter.  Patient saw her ophthalmologist today patient has chronic right retinal vein occlusion and has lost sight in the left eye.  At this time patient denies any complaints complaints of headache she has chronic left eye vision loss the ophthalmologist thought she has some vertical cut in her right eye.  Patient so patient was sent to the ER from the ophthalmology office.Remote right PCA territory infarct. No acute abnormality  Mild atrophy and chronic microvascular ischemic change.mri1. Acute RIGHT caudate head lacunar infarct. Subacute RIGHT thalamus/internal capsule infarct. 2. Old RIGHT occipital lobe/PCA territory infarct. Multiple supratentorial and infratentorial old small vessel infarcts. 3. Mild parenchymal brain volume loss for age. 4. Abnormal signal LEFT ocular globe most consistent with acute to subacute blood products. Atrophic LEFT optic nerve seen with blindness. Recommend correlation with recent ophthalmologic examination. 5. 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 Differential diagnosis includes macroadenoma or, inflammatory process such as sarcoidosis with infundibular involvement. Recommend MRI of the sella with contrast on non motion stasis.     Assessment & Plan:   Active Problems:   CVA (cerebral vascular accident) ([place])  1]acute lacunar infart 2]old occipital lobe pca infarct 3]uncontrolled htn   Plan work up pending.continue asa and statin.increase ivf.follow up renal functions.  DVT prophylaxis:lovenox Code Status full :Family Communication:none Consultants: neuro Procedures:  none Antimicrobials:none  Subjective:no complaints..  Objective:resting in bed in nad Vitals:   05/16/17 1900 05/16/17 2133 05/17/17 0530 05/17/17 1338  BP: (!) 160/102 (!) 171/78 116/76 133/81  Pulse: 82 72 76 66  Resp:  18 18   Temp:  98.5 F (36.9 C) 97.7 F (36.5 C) 98.2 F (36.8 C)  TempSrc:  Oral Oral Oral  SpO2: 99% 100% 100% 100%  Weight:  89.8 kg (197 lb 15.6 oz)    Height:  6' (1.829 m)      Intake/Output Summary (Last 24 hours) at 05/17/2017 1414 Last data filed at 05/16/2017 2130 Gross per 24 hour  Intake 137 ml  Output -  Net 137 ml   Filed Weights   05/16/17 2133  Weight: 89.8 kg (197 lb 15.6 oz)    Examination:  General exam: Appears calm and comfortable  Respiratory system: Clear to auscultation. Respiratory effort normal. Cardiovascular system: S1 & S2 heard, RRR. No JVD, murmurs, rubs, gallops or clicks. No pedal edema. Gastrointestinal system: Abdomen is nondistended, soft and nontender. No organomegaly or masses felt. Normal bowel sounds heard. Central nervous system: Alert and oriented. No focal neurological deficits. Extremities: Symmetric 5 x 5 power. Skin: No rashes, lesions or ulcers Psychiatry: Judgement and insight appear normal. Mood & affect appropriate.     Data Reviewed: I have personally reviewed following labs and imaging studies  CBC: Recent Labs  Lab 05/16/17 1153 05/16/17 1238  WBC 8.2  --   NEUTROABS 5.3  --   HGB 13.0 13.6  HCT 40.1 40.0  MCV 87.0  --   PLT 237  --    Basic Metabolic Panel: Recent Labs  Lab 05/16/17 1153 05/16/17 1238 05/17/17 0316  NA 138 142 139  K 4.1 4.3 3.9  CL 107 111 106  CO2 21*  --  23  GLUCOSE 78 72  81  BUN 17 21* 17  CREATININE 1.64* 1.60* 1.48*  CALCIUM 9.7  --  9.5   GFR: Estimated Creatinine Clearance: 52.2 mL/min (A) (by C-G formula based on SCr of 1.48 mg/dL (H)). Liver Function Tests: Recent Labs  Lab 05/16/17 1153 05/17/17 0316  AST 21 19  ALT 24 20  ALKPHOS 69 66   BILITOT 0.6 0.6  PROT 7.8 7.0  ALBUMIN 4.2 3.8   No results for input(s): LIPASE, AMYLASE in the last 168 hours. No results for input(s): AMMONIA in the last 168 hours. Coagulation Profile: Recent Labs  Lab 05/16/17 1153  INR 1.05   Cardiac Enzymes: No results for input(s): CKTOTAL, CKMB, CKMBINDEX, TROPONINI in the last 168 hours. BNP (last 3 results) No results for input(s): PROBNP in the last 8760 hours. HbA1C: Recent Labs    05/17/17 0316  HGBA1C 5.3   CBG: Recent Labs  Lab 05/16/17 1159  GLUCAP 72   Lipid Profile: Recent Labs    05/17/17 0316  CHOL 304*  HDL 48  LDLCALC 212*  TRIG 220*  CHOLHDL 6.3   Thyroid Function Tests: No results for input(s): TSH, T4TOTAL, FREET4, T3FREE, THYROIDAB in the last 72 hours. Anemia Panel: No results for input(s): VITAMINB12, FOLATE, FERRITIN, TIBC, IRON, RETICCTPCT in the last 72 hours. Sepsis Labs: No results for input(s): PROCALCITON, LATICACIDVEN in the last 168 hours.  No results found for this or any previous visit (from the past 240 hour(s)).       Radiology Studies: Ct Head Wo Contrast  Result Date: 05/16/2017 CLINICAL DATA:  Possible stroke this morning based on an eye exam. EXAM: CT HEAD WITHOUT CONTRAST TECHNIQUE: Contiguous axial images were obtained from the base of the skull through the vertex without intravenous contrast. COMPARISON:  None. FINDINGS: Brain: The patient has a remote appearing right PCA territory infarct. There is some chronic microvascular ischemic change and mild atrophy. No evidence of acute abnormality including hemorrhage, mass lesion, midline shift or abnormal extra-axial fluid collection. No hydrocephalus or pneumocephalus. Vascular: No hyperdense vessel or unexpected calcification. Skull: Intact. Sinuses/Orbits: Negative. Other: None. IMPRESSION: Remote right PCA territory infarct.  No acute abnormality. Mild atrophy and chronic microvascular ischemic change. Electronically Signed    By: Inge Rise M.D.   On: 05/16/2017 12:17   Mr Brain Wo Contrast  Result Date: 05/16/2017 CLINICAL DATA:  Difficulty swallowing, difficulty speaking and RIGHT facial droop for 5 days. RIGHT vision loss. History of hypertension. EXAM: MRI HEAD WITHOUT CONTRAST TECHNIQUE: Multiplanar, multiecho pulse sequences of the brain and surrounding structures were obtained without intravenous contrast. COMPARISON:  CT HEAD May 16, 2017 at 1201 hours FINDINGS: Mild motion degraded examination. BRAIN: 3 mm reduced diffusion RIGHT caudate head with low ADC values. Faint 13 x 21 mm reduced diffusion RIGHT thalamus/posterior limb of the internal capsule with normalized ADC values. RIGHT posterior occipital lobe encephalomalacia. Old small bilateral cerebellar, bilateral pontine, bilateral basal ganglia and bilateral thalamus infarcts. Mild ex vacuo dilatation RIGHT lateral ventricle. Mild general prominence of ventricles and sulci for patient's age. No hydrocephalus. Punctate LEFT occipital chronic microhemorrhage. Scattered supratentorial white matter FLAIR T2 hyperintensities. No midline shift, mass effect or masses. No abnormal extra-axial fluid collections. VASCULAR: Normal major intracranial vascular flow voids present at skull base. 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 No suspicious calvarial bone marrow signal. Craniocervical junction maintained. SINUSES/ORBITS: The mastoid air-cells and included paranasal sinuses are well-aerated. Abnormal mildly increased  T1 signal LEFT ocular globe, which does not suppress on FLAIR sequence. Atrophic T2 bright LEFT optic nerve. Status post RIGHT ocular lens implant. OTHER: 8 mm reduced diffusion LEFT parotid gland with slight chemical shift artifact most consistent with lymph node. IMPRESSION: 1. Acute RIGHT caudate head lacunar infarct. Subacute RIGHT thalamus/internal capsule  infarct. 2. Old RIGHT occipital lobe/PCA territory infarct. Multiple supratentorial and infratentorial old small vessel infarcts. 3. Mild parenchymal brain volume loss for age. 4. Abnormal signal LEFT ocular globe most consistent with acute to subacute blood products. Atrophic LEFT optic nerve seen with blindness. Recommend correlation with recent ophthalmologic examination. 5. 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 Differential diagnosis includes macroadenoma or, inflammatory process such as sarcoidosis with infundibular involvement. Recommend MRI of the sella with contrast on non motion stasis. Electronically Signed   By: Elon Alas M.D.   On: 05/16/2017 21:01        Scheduled Meds: .  stroke: mapping our early stages of recovery book   Does not apply Once  . amLODipine  10 mg Oral Daily  . aspirin  325 mg Oral Daily  . atorvastatin  80 mg Oral q1800  . brimonidine  1 drop Left Eye TID  . dorzolamide  1 drop Left Eye TID  . enoxaparin (LOVENOX) injection  40 mg Subcutaneous Q24H  . hydrALAZINE  25 mg Oral TID  . labetalol  100 mg Oral TID  . senna-docusate  1 tablet Oral QHS  . timolol  1 drop Left Eye BID   Continuous Infusions: . sodium chloride       LOS: 1 day      Georgette Shell, MD [facility]  If 7PM-7AM, please contact night-coverage [email or web address] Password [email or web address] 05/17/2017, 2:14 PM

## 2017-05-17 NOTE — Progress Notes (Signed)
OT Evaluation  PTA, pt lived at home alone and was independent with ADL and mobility. Pt's daughter "checks on her" ever 2 days and assist with errands. [facility] with pt's transportation to eye appts. 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 Pt appears to demonstrate reduced L visual field. Recommend HHOT to address low vision issues and environmental modifications to maximize safety and functional level of independence. Pt educated on need to increased intervention regarding [facility] to assess available assistive devices for the home. Pt verbalized understanding. Will follow acutely to address low vision deficits.    05/17/17 1200  OT Visit Information  Last OT Received On 05/17/17  Assistance Needed +1  History of Present Illness 56 y.o. female with medical history significant of hypertension admitted with right facial droop difficulty swallowing and difficulty speaking 5 days ago. CT head done in the emergency room shows chronic right occipital lobe stroke. MRI revealed Acute RIGHT caudate head lacunar infarct. Subacute RIGHT thalamus/internal capsule infarct.  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 and is blind  Precautions  Precautions None  Restrictions  Weight Bearing Restrictions No  Home Living  Family/patient expects to be discharged to: Private residence  Living Arrangements Alone  Available Help at Discharge Family;Available PRN/intermittently  Type of Home House  Home Access Stairs to enter  Entrance Stairs-Number of Steps 4  Entrance Stairs-Rails Can reach both  Home Layout One level  Bathroom Shower/Tub Tub/shower unit;Curtain  Corporate treasurer Yes  How Accessible Accessible via walker  Home Equipment None  Prior Function  Level of Independence Independent  Comments independent with household ambulation, ADLs, and iADLs. Doesn't go shopping since L eye blindness in 2017     Communication  Communication No difficulties  Pain Assessment  Pain Assessment No/denies pain  Cognition  Arousal/Alertness Awake/alert  Behavior During Therapy WFL for tasks assessed/performed;Flat affect  Overall Cognitive Status Within Functional Limits for tasks assessed (appears functional)  Upper Extremity Assessment  Upper Extremity Assessment Overall WFL for tasks assessed  Lower Extremity Assessment  Lower Extremity Assessment Defer to PT evaluation  Cervical / Trunk Assessment  Cervical / Trunk Assessment Normal  ADL  Overall ADL's  Needs assistance/impaired  Functional mobility during ADLs Supervision/safety  General ADL Comments Pt oversall set up with ADL tasks; able to state medication schedule  Vision- History  Baseline Vision/History Glaucoma  Patient Visual Report Blurring of vision (Blind L eye)  Vision- Assessment  Vision Assessment? Yes  Eye Alignment WFL  Ocular Range of Motion [facility]  Alignment/Gaze Preference WDL  Tracking/Visual Pursuits Decreased smoothness of horizontal tracking;Decreased smoothness of vertical tracking  Saccades Additional head turns occurred during testing;Additional eye shifts occurred during testing  Visual Fields Left visual field deficit  Depth Perception Undershoots  Additional Comments Pt with decreased ability to locate targets in L visual field; Active with [facility] for transportation to appts; pt states vision is worse than her baseline  Bed Mobility  Overal bed mobility Independent  Transfers  Overall transfer level Needs assistance  Transfers Sit to/from Stand  Sit to Stand Supervision  General transfer comment supervision for safety  Balance  Overall balance assessment No apparent balance deficits (not formally assessed)  OT - End of Session  Activity Tolerance Patient tolerated treatment well  Patient left in chair;with call bell/phone within reach  Nurse Communication Mobility status;Other  (comment) (DC summary)  OT Assessment  OT Recommendation/Assessment Patient needs continued OT Services  OT Visit Diagnosis Low vision, both eyes (H54.2)  OT Problem List Impaired vision/perception;Decreased safety awareness  OT Plan  OT Frequency (ACUTE ONLY) Min 3X/week  OT Treatment/Interventions (ACUTE ONLY) Self-care/ADL training;Therapeutic activities;Visual/perceptual remediation/compensation;Patient/family education  AM-PAC OT "6 Clicks" Daily Activity Outcome Measure  Help from another person eating meals? 4  Help from another person taking care of personal grooming? 4  Help from another person toileting, which includes using toliet, bedpan, or urinal? 4  Help from another person bathing (including washing, rinsing, drying)? 4  Help from another person to put on and taking off regular upper body clothing? 4  Help from another person to put on and taking off regular lower body clothing? 4  6 Click Score 24  ADL G Code Conversion CH  OT Recommendation  Follow Up Recommendations Home health OT;Supervision - Intermittent  OT Equipment None recommended by OT  Individuals Consulted  Consulted and Agree with Results and Recommendations Patient  Acute Rehab OT Goals  Patient Stated Goal go home  OT Goal Formulation With patient  Time For Goal Achievement 05/31/17  Potential to Achieve Goals Good  OT Time Calculation  OT Start Time (ACUTE ONLY) 1140  OT Stop Time (ACUTE ONLY) 1200  OT Time Calculation (min) 20 min  OT General Charges  $OT Visit 1 Visit  OT Evaluation  $OT Eval Low Complexity 1 Low  Written Expression  Dominant Hand Right  [facility], OT/L  [phone] 05/17/2017

## 2017-05-17 NOTE — Care Management Note (Addendum)
Case Management Note  Patient Details  Name: Valerie Coleman MRN: [id number] Date of Birth: [birth year]  Subjective/Objective:  Pt admitted with CVA. She is from home alone. No insurance listed but does have a PCP.                  Action/Plan: PT recommending [place] services. Awaiting OT eval. CM following for d/c needs, physician orders.  Addendum: CM provided her resources for PCP and medication assistance in [facility]. CM will follow to assist with medication cost at d/c.   Expected Discharge Date:  05/19/17               Expected Discharge Plan:  [place]  In-House Referral:     Discharge planning Services  CM Consult  Post Acute Care Choice:    Choice offered to:     DME Arranged:    DME Agency:     HH Arranged:    HH Agency:     Status of Service:  In process, will continue to follow  If discussed at Long Length of Stay Meetings, dates discussed:    Additional Comments:  Pollie Friar, RN 05/17/2017, 12:00 PM

## 2017-05-17 NOTE — Progress Notes (Signed)
Nutrition Brief Note  Patient identified on the Malnutrition Screening Tool (MST) Report  Valerie Coleman is a 56 yo female with PMH of HTN, R facial droop, difficulty swallowing and speaking which onset 5 days. 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 Found to have remote right PCA territory infarct.  Spoke with patient at bedside. She reports eating "too much" PTA. Denies weight loss. Ordered lunch was NPO for breakfast. Reports feeling hungry. No reason to believe she won't eat well. Monitor appetite and PO intake.  Labs reviewed Medications reviewed and include:  Senokot-S NS at 165mL/hr  Wt Readings from Last 15 Encounters:  05/16/17 197 lb 15.6 oz (89.8 kg)  01/01/17 200 lb 9.6 oz (91 kg)  10/05/16 195 lb (88.5 kg)  03/20/16 160 lb (72.6 kg)  02/03/16 151 lb (68.5 kg)  12/28/15 162 lb 1.6 oz (73.5 kg)    Body mass index is 26.85 kg/m. Patient meets criteria for overweight based on current BMI.   Current diet order is heart healthy.  No nutrition interventions warranted at this time. If nutrition issues arise, please consult RD.   Satira Anis. Ward, MS, RD LDN Inpatient Clinical Dietitian Pager [phone]

## 2017-05-17 NOTE — Progress Notes (Signed)
  Echocardiogram 2D Echocardiogram has been performed.  Valerie Coleman 05/17/2017, 9:55 AM

## 2017-05-17 NOTE — Evaluation (Signed)
Physical Therapy Evaluation Patient Details Name: Valerie Coleman MRN: [id number] DOB: 31-May-1961 Today's Date: 05/17/2017   History of Present Illness  56 y.o. female with medical history significant of hypertension admitted with right facial droop difficulty swallowing and difficulty speaking 5 days ago. CT head done in the emergency room shows chronic right occipital lobe stroke. MRI revealed Acute RIGHT caudate head lacunar infarct. Subacute RIGHT thalamus/internal capsule infarct.  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 Pt currently with functional limitations due to the deficits listed below (see PT Problem List). Pt is limited in her safe mobility by change in her R visual field compounded by L eye blindness. Pt mobility and balance are good in wide open, well lit environments however pt bumped into sink in room with ambulation. Pt will benefit from HHPT safety evaluation at d/c. PT will continue to follow pt acutely.        Follow Up Recommendations Home health PT(safety evaluation )    Equipment Recommendations  None recommended by PT    Recommendations for Other Services       Precautions / Restrictions Precautions Precautions: None Restrictions Weight Bearing Restrictions: No      Mobility  Bed Mobility Overal bed mobility: Independent                Transfers Overall transfer level: Needs assistance   Transfers: Sit to/from Stand Sit to Stand: Supervision         General transfer comment: supervision for safety  Ambulation/Gait Ambulation/Gait assistance: Supervision Ambulation Distance (Feet): 300 Feet Assistive device: None Gait Pattern/deviations: WFL(Within Functional Limits) Gait velocity: WNL Gait velocity interpretation: >2.62 ft/sec, indicative of independent community ambulator    Stairs Stairs: Yes Stairs assistance: Modified independent (Device/Increase time) Stair  Management: Two rails;Forwards;Alternating pattern Number of Stairs: 4 General stair comments: strong, steady ascent/descent   Modified Rankin (Stroke Patients Only) Modified Rankin (Stroke Patients Only) Pre-Morbid Rankin Score: No symptoms Modified Rankin: No symptoms     Balance Overall balance assessment: No apparent balance deficits (not formally assessed)                               Standardized Balance Assessment Standardized Balance Assessment : Dynamic Gait Index   Dynamic Gait Index Level Surface: Normal Change in Gait Speed: Normal Gait with Horizontal Head Turns: Normal Gait with Vertical Head Turns: Mild Impairment Gait and Pivot Turn: Normal Step Over Obstacle: Mild Impairment Step Around Obstacles: Normal Steps: Mild Impairment Total Score: 21       Pertinent Vitals/Pain Pain Assessment: No/denies pain    Home Living Family/patient expects to be discharged to:: Private residence Living Arrangements: Alone Available Help at Discharge: Family;Available PRN/intermittently Type of Home: House Home Access: Stairs to enter Entrance Stairs-Rails: Can reach both Entrance Stairs-Number of Steps: 4 Home Layout: One level Home Equipment: None      Prior Function Level of Independence: Independent         Comments: independent with household ambulation, ADLs, and iADLs. Doesn't go shopping since L eye blindness in 2017          Extremity/Trunk Assessment   Upper Extremity Assessment Upper Extremity Assessment: Overall WFL for tasks assessed    Lower Extremity Assessment Lower Extremity Assessment: Overall WFL for tasks assessed       Communication   Communication: No difficulties  Cognition Arousal/Alertness: Awake/alert  Behavior During Therapy: WFL for tasks assessed/performed;Flat affect Overall Cognitive Status: Within Functional Limits for tasks assessed                                        General  Comments General comments (skin integrity, edema, etc.): L eye blindness compounded with R eye visual field deficit contributing to linitation in safety awareness      Assessment/Plan    PT Assessment Patient needs continued PT services  PT Problem List Decreased safety awareness;Decreased balance       PT Treatment Interventions Gait training;Therapeutic activities;Balance training;Neuromuscular re-education;Patient/family education    PT Goals (Current goals can be found in the Care Plan section)  Acute Rehab PT Goals Patient Stated Goal: go home PT Goal Formulation: With patient Time For Goal Achievement: 05/31/17 Potential to Achieve Goals: Good    Frequency Min 4X/week   Barriers to discharge Decreased caregiver support daughter only available intermittently    Co-evaluation               AM-PAC PT "6 Clicks" Daily Activity  Outcome Measure Difficulty turning over in bed (including adjusting bedclothes, sheets and blankets)?: None Difficulty moving from lying on back to sitting on the side of the bed? : A Little Difficulty sitting down on and standing up from a chair with arms (e.g., wheelchair, bedside commode, etc,.)?: None Help needed moving to and from a bed to chair (including a wheelchair)?: None Help needed walking in hospital room?: None Help needed climbing 3-5 steps with a railing? : None 6 Click Score: 23    End of Session Equipment Utilized During Treatment: Gait belt Activity Tolerance: Patient tolerated treatment well Patient left: in chair;with call bell/phone within reach Nurse Communication: Mobility status;Other (comment)(IV leaking) PT Visit Diagnosis: Unsteadiness on feet (R26.81)    Time: 3335-4562 PT Time Calculation (min) (ACUTE ONLY): 32 min   Charges:   PT Evaluation $PT Eval Moderate Complexity: 1 Mod PT Treatments $Gait Training: 8-22 mins   PT G Codes:        Elizabeth B. Migdalia Dk PT, DPT Acute Rehabilitation  [phone] Pager [phone]    [place] 05/17/2017, 12:09 PM

## 2017-05-18 ENCOUNTER — Inpatient Hospital Stay (HOSPITAL_COMMUNITY): Payer: Medicaid Other

## 2017-05-18 DIAGNOSIS — N289 Disorder of kidney and ureter, unspecified: Secondary | ICD-10-CM

## 2017-05-18 DIAGNOSIS — H5440 Blindness, one eye, unspecified eye: Secondary | ICD-10-CM

## 2017-05-18 LAB — BASIC METABOLIC PANEL
Anion gap: 6 (ref 5–15)
Anion gap: 6 (ref 5–15)
BUN: 21 mg/dL — ABNORMAL HIGH (ref 6–20)
BUN: 17 mg/dL (ref 6–20)
CO2: 21 mmol/L — ABNORMAL LOW (ref 22–32)
CO2: 22 mmol/L (ref 22–32)
Calcium: 8.8 mg/dL — ABNORMAL LOW (ref 8.9–10.3)
Calcium: 9 mg/dL (ref 8.9–10.3)
Chloride: 110 mmol/L (ref 101–111)
Chloride: 110 mmol/L (ref 101–111)
Creatinine, Ser: 1.84 mg/dL — ABNORMAL HIGH (ref 0.44–1.00)
Creatinine, Ser: 1.49 mg/dL — ABNORMAL HIGH (ref 0.44–1.00)
GFR calc Af Amer: 34 mL/min — ABNORMAL LOW (ref >60)
GFR calc Af Amer: 44 mL/min — ABNORMAL LOW (ref >60)
GFR calc non Af Amer: 29 mL/min — ABNORMAL LOW (ref >60)
GFR calc non Af Amer: 38 mL/min — ABNORMAL LOW (ref >60)
Glucose, Bld: 95 mg/dL (ref 65–99)
Glucose, Bld: 81 mg/dL (ref 65–99)
Potassium: 4.3 mmol/L (ref 3.5–5.1)
Potassium: 4.1 mmol/L (ref 3.5–5.1)
Sodium: 137 mmol/L (ref 135–145)
Sodium: 138 mmol/L (ref 135–145)

## 2017-05-18 NOTE — Progress Notes (Signed)
  Speech Language Pathology  Patient Details Name: Valerie Coleman MRN: [id number] DOB: Oct 27, 1960 Today's Date: 05/18/2017 Time:  -       Informal screen did not warrant full assessment. Speech is intelligible; no complaints with memory. Pt able to read written instructions from OT.   [place] Siren 05/18/2017, 12:12 PM

## 2017-05-18 NOTE — Progress Notes (Signed)
STROKE TEAM PROGRESS NOTE   SUBJECTIVE (INTERVAL HISTORY) No family  is at the bedside. Patient is found laying in bed in NAD  Overall she feels her condition is unchanged. Voices no new complaints. No new events reported overnight.  OBJECTIVE Lab Results: CBC:  Recent Labs  Lab 05/16/17 1153 05/16/17 1238  WBC 8.2  --   HGB 13.0 13.6  HCT 40.1 40.0  MCV 87.0  --   PLT 237  --    BMP: Recent Labs  Lab 05/16/17 1153 05/16/17 1238 05/17/17 0316 05/18/17 0320 05/18/17 1432  NA 138 142 139 137 138  K 4.1 4.3 3.9 4.3 4.1  CL 107 111 106 110 110  CO2 21*  --  23 21* 22  GLUCOSE 78 72 81 95 81  BUN 17 21* 17 21* 17  CREATININE 1.64* 1.60* 1.48* 1.84* 1.49*  CALCIUM 9.7  --  9.5 8.8* 9.0   Liver Function Tests:  Recent Labs  Lab 05/16/17 1153 05/17/17 0316  AST 21 19  ALT 24 20  ALKPHOS 69 66  BILITOT 0.6 0.6  PROT 7.8 7.0  ALBUMIN 4.2 3.8   Coagulation Studies:  Recent Labs    05/16/17 1153  APTT 28  INR 1.05    PHYSICAL EXAM Temp:  [97.6 F (36.4 C)-98.5 F (36.9 C)] 98.3 F (36.8 C) (12/07 1344) Pulse Rate:  [64-68] 68 (12/07 1344) Resp:  [18-20] 20 (12/07 0931) BP: (121-139)/(74-83) 132/80 (12/07 1344) SpO2:  [98 %-100 %] 100 % (12/07 0931) General - Well nourished, well developed, in no apparent distress Respiratory - Lungs clear bilaterally. No wheezing. Cardiovascular - Regular rate and rhythm  Neurological Examination Mental Status: Alert, oriented, thought content appropriate.  Speech fluent without evidence of aphasia.  Able to follow 3 step commands without difficulty. Cranial Nerves: II: Visual fields : Left eye blind, right eye : +Right eye left lower quadrantanopia. III,IV, VI: ptosis not present, extra-ocular motions intact bilaterally,  V,VII: smile asymmetric- slight Left facial droop ?Baseline finding, facial light touch sensation normal bilaterally VIII: hearing normal bilaterally IX,X: uvula rises symmetrically XI: bilateral  shoulder shrug XII: midline tongue extension Motor: Right :  Upper extremity   5/5                                      Left:     Upper extremity   5/5             Lower extremity   5/5                                                  Lower extremity   5/5 Tone and bulk:normal tone throughout; no atrophy noted Sensory: Pinprick and light touch intact throughout, bilaterally Deep Tendon Reflexes: 2+ and symmetric throughout Plantars: Right: downgoing                                Left: downgoing Cerebellar: normal finger-to-nose, normal rapid alternating movements and normal heel-to-shin test Gait: normal gait and station   IMAGING: I have personally reviewed the radiological images below and agree with the radiology interpretations. Ct Head Wo Contrast Result Date: 05/16/2017 IMPRESSION: Remote right PCA territory  infarct.  No acute abnormality. Mild atrophy and chronic microvascular ischemic change.   Mr Brain Wo Contrast Result Date: 05/16/2017 IMPRESSION: 1. Acute RIGHT caudate head lacunar infarct.  Subacute RIGHT thalamus/internal capsule infarct.  Old RIGHT occipital lobe/PCA territory infarct.  Multiple supratentorial and infratentorial old small vessel infarcts. 3. Mild parenchymal brain volume loss for age. 4. Abnormal signal LEFT ocular globe most consistent with acute to subacute blood products. Atrophic LEFT optic nerve seen with blindness. Recommend correlation with recent ophthalmologic examination. 5. 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 Differential diagnosis includes macroadenoma or, inflammatory process such as sarcoidosis with infundibular involvement. Recommend MRI of the sella with contrast on non motion stasis.   Echocardiogram:  Study Conclusions - Left ventricle: The cavity size was normal. There was mild focal   basal hypertrophy of the septum. Systolic function was normal.   The estimated ejection fraction was in the range of 55% to 60%.    Wall motion was normal; there were no regional wall motion   abnormalities. Doppler parameters are consistent with abnormal   left ventricular relaxation (grade 1 diastolic dysfunction  History per PCP records of [facility]. No show for outpatient Carotid U/S B/L Carotid U/S:05/17/2017                                                         Right Carotid: There is evidence in the right ICA of a 1-39% stenosis. Somewhat difficult evaluation due to tissue attenuation and penetration. Left Carotid: There is evidence in the left ICA of a 1-39% stenosis. Somewhat difficult evaluation due to tissue attenuation and penetration. Vertebrals: Both vertebral arteries were patent with antegrade flow _____________________________________________________________________ ASSESSMENT: Valerie Coleman is a 56 y.o. female with PMH of prior CVA, Hx of uncontrolled HTN, medication and medical advice non-compliance who presents following appointment with ophthalmologist. He detected right eye visual field deficit, not present on her ophthalmological examination 6 weeks ago. 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 CT head done in the emergency room shows chronic right occipital lobe stroke. Admission complaints: The patient denies any current complaints  Daughter notes that since last Thursday the patient has experienced right-sided facial droop with drooling from the right side of her mouth as well as difficulty with swallowing and choking at times and slurred speech. MRI reveals:  Acute RIGHT caudate head lacunar infarct.  Subacute RIGHT thalamus/internal capsule infarct.  Old RIGHT occipital lobe/PCA territory infarct.  Multiple supratentorial and infratentorial old small vessel infarcts. 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  Patient did not notice any visual complaints, but stated that while she was treated for right eye glaucoma, her ophthalmologist concerning for stroke and asked her to come to ER for evaluation.  Apparently, according to notes, she was found to have new right eye visual field deficit which was not there 6 weeks ago. She did admit that she had left facial droop, slurred speech, difficulty swallowing last Friday and Sunday, but now resolved.  On examination, she had left eye completely blind, pupil dilated, corneal clouding, no pupillary reflex.  Right eye left lower quadrantanopia.  Extraocular  movement intact.  No other neuro deficit at this time.  CT showed right occipital chronic infarct.  MRI showed right acute CR/BG infarct, old right occipital, pontine, right BG infarcts.  LDL 212, A1c 5.3.  Her stroke likely due to small vessel disease, in the setting of several uncontrolled risk factors including HTN, HLD, CKD, history of coronary infarcts.  On aspirin and high-dose Lipitor.  The current acute right CR/BG stroke likely to explain her symptoms last Friday and Sunday of left facial droop, slurred speech and difficulty swallowing.  However, her right left lower quadrantanopsia likely related to her right occipital old infarct on CT and MRI.  Not sure if this happened within the last 6 weeks or chronic not recognized by her ophthalmologist.  For further workup, would like to have a CT head and neck.  Her creatinine this morning 1.48 down from 1.64 yesterday, would like to continue IV hydration and p.o. intake, check creatinine in the morning, if normalized, will proceed with CT head and neck. In addition, MRI showed 10 mm of sellar mass tenting to optic chiasm.  Although her left lower quadrantanopsia not consistent with optic chiasm compression by sellar mass, still would like to further workup with MRI of the sellar with contrast.  Pending creatinine in the morning.  05/18/17: Neuro  exam remains stable. Awaiting MRA. Creatinine trending down slowly, IVF's remain in progress. Carotid U/S negative.   STROKE:  Suspected Etiology: small vessel disease vs cardioembolic vs Right carotid stenosis  Resultant Symptoms:  Right temporal quadrantanopsia and Left facial droop- ? baseline Stroke Risk Factors: hyperlipidemia and hypertension Other Stroke Risk Factors: Advanced age, Hx stroke  Outstanding Stroke Work-up Studies: MRA w/contrast                                                       PENDING  05/18/17  PLAN  05/18/2017: Continue Aspirin/ Statin MRA w/contrast in AM if creatinine improved to assess sella mass Ongoing aggressive stroke risk factor management Patient counseled to be compliant with her antithrombotic medications PT/OT/SLP Follow up with [facility] in 6 weeks  HX OF STROKES: Remote right PCA territory infarct.  Chronic Left optic nerve occlusion, Baseline Findings:  Left eye blindness   Dysphagia Reported by daughter NPO until passes SLP evaluation  CKD: Trending down slowly Gentle IV hydration Repeat AM labs  HYPERTENSION: Stable, some elevated B/P's noted over night Permissive hypertension (OK if <220/120) for 24-48 hours post stroke and then gradually normalized within 5-7 days. Long term BP goal normotensive. May slowly restart home B/P medications after 48 hours Home Meds: Norvasc, Apresoline, Labetalol  HYPERLIPIDEMIA:    Component Value Date/Time   CHOL 304 (H) 05/17/2017 0316   TRIG 220 (H) 05/17/2017 0316   HDL 48 05/17/2017 0316   CHOLHDL 6.3 05/17/2017 0316   VLDL 44 (H) 05/17/2017 0316   LDLCALC 212 (H) 05/17/2017 0316  Home Meds:  NONE LDL  goal < 70 Started on  Lipitor to 80 mg daily Continue statin at discharge  DIABETES: Lab Results  Component Value Date   HGBA1C 5.3 05/17/2017  HgbA1c goal < 7.0  Other Active Problems: Active Problems:   CVA (cerebral vascular accident) ([place])   Glaucoma of  both eyes   Blind left eye   Essential hypertension   Hyperlipidemia  Hospital day # 2  VTE prophylaxis: Lovenox  Diet : Diet Heart Room service appropriate? Yes; Fluid consistency: Thin   Prior Home Stroke Medications: No antithrombotic  Stroke New Meds Plan: Now on aspirin 325 mg daily and Lipitor 80 mg  Discharge Stroke Meds: Please discharge patient on  aspirin 325 mg daily and Lipitor 80 mg   Disposition: 01-Home or Self Care Therapy Recs:  PENDING Follow Recs:  Follow-up Information    Rosalin Hawking, MD. Schedule an appointment as soon as possible for a visit in 6 week(s).   Specialty:  Neurology Contact information: [address] [place] [phone]          Scot Jun, [place]- PCP in 1-2 weeks  FAMILY UPDATES: No family at bedside  TEAM UPDATES: Georgette Shell, MD  Renie Ora Stroke Neurology Team 05/18/2017 4:31 PM  Attending note: I reviewed above note and agree with the assessment and plan. I have made any additions or clarifications directly to the above note. Pt was seen and examined.   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  Patient did not notice any visual complaints, but stated that while she was treated for right eye glaucoma, her ophthalmologist concerning for stroke and asked her to come to ER for evaluation.  Apparently, according to notes, she was found to have new right eye visual field deficit which was not there 6 weeks ago.   She did admit that she had left facial droop, slurred speech, difficulty swallowing last Friday and Sunday, but now resolved.  On examination, she had left eye completely blind, pupil dilated, corneal clouding, no pupillary reflex.  Right eye left lower quadrantanopia.  Extraocular movement intact.  No other neuro deficit at this time.  CT  showed right occipital chronic infarct.  MRI showed right acute CR/BG infarct, old right occipital, pontine, right BG infarcts. Carotid doppler showed no ICA stenosis. EF 55-60%.  LDL 212, A1c 5.3.  Her stroke likely due to small vessel disease, in the setting of several uncontrolled risk factors including HTN, HLD, CKD, history of coronary infarcts.  On aspirin and high-dose Lipitor.  The current acute right CR/BG stroke likely to explain her symptoms last Friday and Sunday of left facial droop, slurred speech and difficulty swallowing.  However, her right left lower quadrantanopsia likely related to her right occipital old infarct on CT and MRI.  Not sure if this happened within the last 6 weeks or chronic not recognized by her ophthalmologist. Currently pending MRA head.   In addition, MRI showed 10 mm of sellar mass tenting to optic chiasm.  Although her left lower quadrantanopsia not consistent with optic chiasm compression by sellar mass, still would like to further workup with MRI of the sellar with contrast. MRI of sellar currently pending.    Rosalin Hawking, MD PhD Stroke Neurology 05/18/2017 10:44 PM  To contact Stroke Continuity provider, please refer to [email or web address] After hours, contact General Neurology

## 2017-05-18 NOTE — Progress Notes (Signed)
Physical Therapy Treatment Patient Details Name: Valerie Coleman MRN: [id number] DOB: [birth year] Today's Date: 05/18/2017    History of Present Illness 56 y.o. female with medical history significant of hypertension admitted with right facial droop difficulty swallowing and difficulty speaking 5 days ago. CT head done in the emergency room shows chronic right occipital lobe stroke. MRI revealed Acute RIGHT caudate head lacunar infarct. Subacute RIGHT thalamus/internal capsule infarct.  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 Pt with good stability throughout. D/c plan remains appropriate. PT will continue to follow acutely.    Follow Up Recommendations  Home health PT(safety evaluation )     Equipment Recommendations  None recommended by PT    Recommendations for Other Services      Precautions / Restrictions Precautions Precautions: None Restrictions Weight Bearing Restrictions: No    Mobility  Bed Mobility Overal bed mobility: Independent                Transfers Overall transfer level: Needs assistance   Transfers: Sit to/from Stand Sit to Stand: Modified independent (Device/Increase time)              Modified Rankin (Stroke Patients Only) Modified Rankin (Stroke Patients Only) Pre-Morbid Rankin Score: No symptoms Modified Rankin: No symptoms     Balance Overall balance assessment: No apparent balance deficits (not formally assessed)                                          Cognition Arousal/Alertness: Awake/alert Behavior During Therapy: WFL for tasks assessed/performed;Flat affect Overall Cognitive Status: Within Functional Limits for tasks assessed                                        Exercises Total Joint  Exercises Towel Squeeze: AROM;Both;10 reps;Seated Hip ABduction/ADduction: AROM;Both;10 reps;Standing Standing Hip Extension: AROM;Both;10 reps;Standing General Exercises - Lower Extremity Long Arc Quad: AROM;Both;10 reps;Seated Hip ABduction/ADduction: (with pillow squeeze) Hip Flexion/Marching: AROM;Both;10 reps;Seated Toe Raises: AROM;Both;10 reps;Seated Heel Raises: AROM;Both;10 reps;Seated Mini-Sqauts: AROM;Both;10 reps;Standing    General Comments        Pertinent Vitals/Pain Pain Assessment: No/denies pain           PT Goals (current goals can now be found in the care plan section) Acute Rehab PT Goals Patient Stated Goal: go home PT Goal Formulation: With patient Time For Goal Achievement: 05/31/17 Potential to Achieve Goals: Good Progress towards PT goals: PT to reassess next treatment    Frequency    Min 4X/week      PT Plan Current plan remains appropriate    Co-evaluation              AM-PAC PT "6 Clicks" Daily Activity  Outcome Measure  Difficulty turning over in bed (including adjusting bedclothes, sheets and blankets)?: None Difficulty moving from lying on back to sitting on the side of the bed? : A Little Difficulty sitting down on and standing up from a chair with arms (e.g., wheelchair, bedside commode, etc,.)?: None Help needed moving to and from a bed to chair (  including a wheelchair)?: None Help needed walking in hospital room?: None Help needed climbing 3-5 steps with a railing? : None 6 Click Score: 23    End of Session Equipment Utilized During Treatment: Gait belt Activity Tolerance: Patient tolerated treatment well;Patient limited by fatigue Patient left: in chair;with call bell/phone within reach Nurse Communication: Mobility status;Other (comment)(IV leaking) PT Visit Diagnosis: Unsteadiness on feet (R26.81)     Time: 0712-1975 PT Time Calculation (min) (ACUTE ONLY): 16 min  Charges:  $Therapeutic Exercise: 8-22  mins                    G Codes:       Elizabeth B. Migdalia Dk PT, DPT Acute Rehabilitation  [phone] Pager [phone]     [place] 05/18/2017, 2:47 PM

## 2017-05-18 NOTE — Progress Notes (Signed)
Occupational Therapy Treatment Patient Details Name: Valerie Coleman MRN: [id number] DOB: 27-May-1961 Today's Date: 05/18/2017    History of present illness 56 y.o. female with medical history significant of hypertension admitted with right facial droop difficulty swallowing and difficulty speaking 5 days ago. CT head done in the emergency room shows chronic right occipital lobe stroke. MRI revealed Acute RIGHT caudate head lacunar infarct. Subacute RIGHT thalamus/internal capsule infarct.  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 Also began education on compensating for apparent L visual field cut. Pt states "I didn't realize my vision was this bad". Continue to recommend follow up with [facility]. Recommend S with medication and financial management. Pt verbalized understanding.   Follow Up Recommendations  Home health OT;Supervision - Intermittent    Equipment Recommendations  None recommended by OT    Recommendations for Other Services      Precautions / Restrictions Precautions Precautions: None;Other (comment) Precaution Comments: low vision deficits       Mobility Bed Mobility  independent                  Transfers       Sit to Stand: Modified independent (Device/Increase time)         General transfer comment: s due to IV  pole    Balance                                           ADL either performed or assessed with clinical judgement   ADL Overall ADL's : Needs assistance/impaired                                     Functional mobility during ADLs: Supervision/safety General ADL Comments: Pt states she bathed herself this am; Discussed how she is frustrated at her new "vision loss...it's worse than it was beofre". Educated on  compensatory technqiues and how to implement some of these techniques/environmental modifications within the home.      Vision   Additional Comments: Educated pt on compensating for her vision deficits by modifiying her environment by increasing lighting and contrast and reducing clutter/increasing organization. discussed medication management and the importance of organizing medicaitons/using pill box to help with safety/accuracy with medication management - recommend that her daughter S this; pt verbalized understanding. Also educated on reducing Glare (due to vision deficits from Glaucoma.) by shutting blinds/putting cover over the window, or using "yellow tint lens" Pt verbalized understanding. Also educated on important of "feeling her way around". Educated on Risk analyst and using "puffy paint" on stove knobs to help identify settings on temperature controls.    Perception     Praxis      Cognition Arousal/Alertness: Awake/alert Behavior During Therapy: Flat affect Overall Cognitive Status: No family/caregiver present to determine baseline cognitive functioning                                 General Comments: cognition appears intact for functional tasks; Able to verbalize understanding of visual information        Exercises     Shoulder Instructions  General Comments      Pertinent Vitals/ Pain       Pain Assessment: 0-10  Home Living                                          Prior Functioning/Environment              Frequency  Min 3X/week        Progress Toward Goals  OT Goals(current goals can now be found in the care plan section)  Progress towards OT goals: Progressing toward goals  Acute Rehab OT Goals Patient Stated Goal: go home OT Goal Formulation: With patient Time For Goal Achievement: 05/31/17 Potential to Achieve Goals: Good ADL Goals Additional ADL Goal #1: Pt will independently verbalie 3  strategies to use for low vision deficits.  Plan Discharge plan remains appropriate    Co-evaluation                 AM-PAC PT "6 Clicks" Daily Activity     Outcome Measure   Help from another person eating meals?: None Help from another person taking care of personal grooming?: None Help from another person toileting, which includes using toliet, bedpan, or urinal?: None Help from another person bathing (including washing, rinsing, drying)?: None Help from another person to put on and taking off regular upper body clothing?: None Help from another person to put on and taking off regular lower body clothing?: None 6 Click Score: 24    End of Session    OT Visit Diagnosis: Low vision, both eyes (H54.2)   Activity Tolerance Patient tolerated treatment well   Patient Left in bed;with call bell/phone within reach   Nurse Communication Other (comment)(DC needs)        Time: 3785-8850 OT Time Calculation (min): 23 min  Charges: OT General Charges $OT Visit: 1 Visit OT Treatments $Self Care/Home Management : 23-37 mins  [facility], OT/L  [phone] 05/18/2017   WARD,HILLARY 05/18/2017, 9:23 AM

## 2017-05-18 NOTE — Progress Notes (Signed)
PROGRESS NOTE    Halei Hanover  KYH:[id number] DOB: Sep 24, 1960 DOA: 05/16/2017 PCP: Scot Jun, FNP   Brief Narrative:56 y.o.femalewith medical history significant ofhypertension admitted with right facial droop difficulty swallowing and difficulty speaking 5 days ago. Patient lives alone. This was reported by her daughter. Patient saw her ophthalmologist today patient has chronic right retinal vein occlusion and has lost sightin the left eye. At this time patient denies any complaints complaints of headache she has chronic left eye vision loss the ophthalmologist thought she has some vertical cut in her right eye. Patient so patient was sent to the ER from the ophthalmology office.Remote right PCA territory infarct. No acute abnormality  Mild atrophy and chronic microvascular ischemic change.mri1. Acute RIGHT caudate head lacunar infarct. Subacute RIGHT thalamus/internal capsule infarct. 2. Old RIGHT occipital lobe/PCA territory infarct. Multiple supratentorial and infratentorial old small vessel infarcts. 3. Mild parenchymal brain volume loss for age. 4. Abnormal signal LEFT ocular globe most consistent with acute to subacute blood products. Atrophic LEFT optic nerve seen with blindness. Recommend correlation with recent ophthalmologic examination. 5. 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 Differential diagnosis includes macroadenoma or, inflammatory  process such as sarcoidosis with infundibular involvement. Recommend MRI of the sella with contrast on non motion stasis. 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 3]uncontrolled htn bp better ,stable 4]A on CKD- not sure why cr bumped up.i had inceased the fluids  yesterday.?long standing uncontrolled htn.  Plan was to get mri with contrast if renal function improves.will continue ivf.renal ultrasound.follow neuro recs.  DVT prophylaxis:lovenox Code Status:full Family Communication:none Disposition Plan: tbd Consultants:  neuro Procedures:none Antimicrobials:  none Subjective:feels vision in right eye getting worse.  Objective: Vitals:   05/18/17 0112 05/18/17 0523 05/18/17 0931 05/18/17 1344  BP: 121/79 139/83 130/80 132/80  Pulse: 68 68 64 68  Resp: 18 18 20    Temp: 98 F (36.7 C) 98.5 F (36.9 C) 97.6 F (36.4 C) 98.3 F (36.8 C)  TempSrc: Oral Oral Oral Oral  SpO2: 100% 98% 100%   Weight:      Height:        Intake/Output Summary (Last 24 hours) at 05/18/2017 1559 Last data filed at 05/18/2017 1400 Gross per 24 hour  Intake 3440 ml  Output -  Net 3440 ml   Filed Weights   05/16/17 2133  Weight: 89.8 kg (197 lb 15.6 oz)    Examination:  General exam: Appears calm and comfortable  Respiratory system: Clear to auscultation. Respiratory effort normal. Cardiovascular system: S1 & S2 heard, RRR. No JVD, murmurs, rubs, gallops or clicks. No pedal edema. Gastrointestinal system: Abdomen is nondistended, soft and nontender. No organomegaly or masses felt. Normal bowel sounds heard. Central nervous system: Alert and oriented.left eye blind with corneal hazy.Extremities: Symmetric 5 x 5 power. Skin: No rashes, lesions or ulcers    Data Reviewed: I have personally reviewed following labs and imaging studies  CBC: Recent Labs  Lab 05/16/17 1153 05/16/17 1238  WBC 8.2  --   NEUTROABS 5.3  --   HGB 13.0 13.6  HCT 40.1 40.0  MCV 87.0  --   PLT 237  --    Basic Metabolic Panel: Recent Labs  Lab 05/16/17 1153 05/16/17 1238 05/17/17  6967 05/18/17 0320 05/18/17 1432  NA 138 142 139 137 138  K 4.1 4.3 3.9 4.3 4.1  CL 107 111 106 110 110  CO2 21*  --  23 21* 22  GLUCOSE 78 72 81 95 81  BUN 17 21* 17 21* 17    CREATININE 1.64* 1.60* 1.48* 1.84* 1.49*  CALCIUM 9.7  --  9.5 8.8* 9.0   GFR: Estimated Creatinine Clearance: 51.8 mL/min (A) (by C-G formula based on SCr of 1.49 mg/dL (H)). Liver Function Tests: Recent Labs  Lab 05/16/17 1153 05/17/17 0316  AST 21 19  ALT 24 20  ALKPHOS 69 66  BILITOT 0.6 0.6  PROT 7.8 7.0  ALBUMIN 4.2 3.8   No results for input(s): LIPASE, AMYLASE in the last 168 hours. No results for input(s): AMMONIA in the last 168 hours. Coagulation Profile: Recent Labs  Lab 05/16/17 1153  INR 1.05   Cardiac Enzymes: No results for input(s): CKTOTAL, CKMB, CKMBINDEX, TROPONINI in the last 168 hours. BNP (last 3 results) No results for input(s): PROBNP in the last 8760 hours. HbA1C: Recent Labs    05/17/17 0316  HGBA1C 5.3   CBG: Recent Labs  Lab 05/16/17 1159  GLUCAP 72   Lipid Profile: Recent Labs    05/17/17 0316  CHOL 304*  HDL 48  LDLCALC 212*  TRIG 220*  CHOLHDL 6.3   Thyroid Function Tests: No results for input(s): TSH, T4TOTAL, FREET4, T3FREE, THYROIDAB in the last 72 hours. Anemia Panel: No results for input(s): VITAMINB12, FOLATE, FERRITIN, TIBC, IRON, RETICCTPCT in the last 72 hours. Sepsis Labs: No results for input(s): PROCALCITON, LATICACIDVEN in the last 168 hours.  No results found for this or any previous visit (from the past 240 hour(s)).       Radiology Studies: Mr Brain 104 Contrast  Result Date: 05/16/2017 CLINICAL DATA:  Difficulty swallowing, difficulty speaking and RIGHT facial droop for 5 days. RIGHT vision loss. History of hypertension. EXAM: MRI HEAD WITHOUT CONTRAST TECHNIQUE: Multiplanar, multiecho pulse sequences of the brain and surrounding structures were obtained without intravenous contrast. COMPARISON:  CT HEAD May 16, 2017 at 1201 hours FINDINGS: Mild motion degraded examination. BRAIN: 3 mm reduced diffusion RIGHT caudate head with low ADC values. Faint 13 x 21 mm reduced diffusion RIGHT  thalamus/posterior limb of the internal capsule with normalized ADC values. RIGHT posterior occipital lobe encephalomalacia. Old small bilateral cerebellar, bilateral pontine, bilateral basal ganglia and bilateral thalamus infarcts. Mild ex vacuo dilatation RIGHT lateral ventricle. Mild general prominence of ventricles and sulci for patient's age. No hydrocephalus. Punctate LEFT occipital chronic microhemorrhage. Scattered supratentorial white matter FLAIR T2 hyperintensities. No midline shift, mass effect or masses. No abnormal extra-axial fluid collections. VASCULAR: Normal major intracranial vascular flow voids present at skull base. 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 No suspicious calvarial bone marrow signal. Craniocervical junction maintained. SINUSES/ORBITS: The mastoid air-cells and included paranasal sinuses are well-aerated. Abnormal mildly increased T1 signal LEFT ocular globe, which does not suppress on FLAIR sequence. Atrophic T2 bright LEFT optic nerve. Status post RIGHT ocular lens implant. OTHER: 8 mm reduced diffusion LEFT parotid gland with slight chemical shift artifact most consistent with lymph node. IMPRESSION: 1. Acute RIGHT caudate head lacunar infarct. Subacute RIGHT thalamus/internal capsule infarct. 2. Old RIGHT occipital lobe/PCA territory infarct. Multiple supratentorial and infratentorial old small vessel infarcts. 3. Mild parenchymal brain volume loss for age. 4. Abnormal signal LEFT ocular globe most consistent  with acute to subacute blood products. Atrophic LEFT optic nerve seen with blindness. Recommend correlation with recent ophthalmologic examination. 5. 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 Differential diagnosis includes macroadenoma or, inflammatory process such as sarcoidosis with infundibular involvement. Recommend MRI of the sella with contrast on non motion  stasis. Electronically Signed   By: Elon Alas M.D.   On: 05/16/2017 21:01        Scheduled Meds: . amLODipine  10 mg Oral Daily  . aspirin  325 mg Oral Daily  . atorvastatin  80 mg Oral q1800  . brimonidine  1 drop Left Eye TID  . dorzolamide  1 drop Left Eye TID  . enoxaparin (LOVENOX) injection  40 mg Subcutaneous Q24H  . hydrALAZINE  25 mg Oral TID  . labetalol  100 mg Oral TID  . senna-docusate  1 tablet Oral QHS  . timolol  1 drop Left Eye BID   Continuous Infusions: . sodium chloride 150 mL/hr at 05/18/17 1439     LOS: 2 days      Georgette Shell, MD [facility] If 7PM-7AM, please contact night-coverage [email or web address] Password [email or web address] 05/18/2017, 3:59 PM

## 2017-05-19 ENCOUNTER — Inpatient Hospital Stay (HOSPITAL_COMMUNITY): Payer: Medicaid Other

## 2017-05-19 ENCOUNTER — Encounter (HOSPITAL_COMMUNITY): Payer: Self-pay | Admitting: Radiology

## 2017-05-19 LAB — BASIC METABOLIC PANEL
Anion gap: 9 (ref 5–15)
BUN: 11 mg/dL (ref 6–20)
CO2: 19 mmol/L — ABNORMAL LOW (ref 22–32)
Calcium: 9.4 mg/dL (ref 8.9–10.3)
Chloride: 109 mmol/L (ref 101–111)
Creatinine, Ser: 1.38 mg/dL — ABNORMAL HIGH (ref 0.44–1.00)
GFR calc Af Amer: 48 mL/min — ABNORMAL LOW (ref >60)
GFR calc non Af Amer: 41 mL/min — ABNORMAL LOW (ref >60)
Glucose, Bld: 77 mg/dL (ref 65–99)
Potassium: 4.1 mmol/L (ref 3.5–5.1)
Sodium: 137 mmol/L (ref 135–145)

## 2017-05-19 MED ORDER — ATORVASTATIN CALCIUM 80 MG PO TABS: 80.0000 mg | ORAL_TABLET | Freq: Every day | ORAL | 0 refills | Status: DC

## 2017-05-19 MED ORDER — ASPIRIN 325 MG PO TABS: 325.0000 mg | ORAL_TABLET | Freq: Every day | ORAL | Status: DC

## 2017-05-19 MED ORDER — GADOBENATE DIMEGLUMINE 529 MG/ML IV SOLN
20.0000 mL | Freq: Once | INTRAVENOUS | Status: AC
  Administered 2017-05-19: 20 mL via INTRAVENOUS

## 2017-05-19 NOTE — Progress Notes (Signed)
Occupational Therapy Treatment Patient Details Name: Valerie Coleman MRN: [id number] DOB: [birth year] Today's Date: 05/19/2017    History of present illness 56 y.o. female with medical history significant of hypertension admitted with right facial droop difficulty swallowing and difficulty speaking 5 days ago. CT head done in the emergency room shows chronic right occipital lobe stroke. MRI revealed Acute RIGHT caudate head lacunar infarct. Subacute RIGHT thalamus/internal capsule infarct.  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 and is blind   OT comments  Pt progressing towards acute OT goals. Focus of session was compensatory strategies for low vision with ADLs. D/c plan remains appropriate.    Follow Up Recommendations  Home health OT;Supervision - Intermittent    Equipment Recommendations  None recommended by OT    Recommendations for Other Services      Precautions / Restrictions Precautions Precautions: None Precaution Comments: low vision deficits Restrictions Weight Bearing Restrictions: No       Mobility Bed Mobility Overal bed mobility: Independent                Transfers                      Balance Overall balance assessment: No apparent balance deficits (not formally assessed)                                         ADL either performed or assessed with clinical judgement   ADL Overall ADL's : Needs assistance/impaired     Grooming: Supervision/safety;Wash/dry hands Grooming Details (indicate cue type and reason): located lotion on right side of sink in cluttered setting with no apparent difficulty.                               General ADL Comments: Discussed safety at home and compensation strategies for low vision. Recommended medication oversight from daughter. Able to recall 1 visual comp strategy at start of session.     Vision       Perception     Praxis      Cognition Arousal/Alertness:  Awake/alert Behavior During Therapy: WFL for tasks assessed/performed;Flat affect Overall Cognitive Status: Within Functional Limits for tasks assessed                                 General Comments: cognition appears intact for functional tasks; Able to verbalize understanding of visual information        Exercises     Shoulder Instructions       General Comments      Pertinent Vitals/ Pain       Pain Assessment: No/denies pain  Home Living                                          Prior Functioning/Environment              Frequency  Min 3X/week        Progress Toward Goals  OT Goals(current goals can now be found in the care plan section)  Progress towards OT goals: Progressing toward goals  Acute Rehab OT Goals Patient Stated Goal: go home OT Goal Formulation: With  patient Time For Goal Achievement: 05/31/17 Potential to Achieve Goals: Good ADL Goals Additional ADL Goal #1: Pt will independently verbalie 3 strategies to use for low vision deficits.  Plan Discharge plan remains appropriate    Co-evaluation                 AM-PAC PT "6 Clicks" Daily Activity     Outcome Measure   Help from another person eating meals?: None Help from another person taking care of personal grooming?: None Help from another person toileting, which includes using toliet, bedpan, or urinal?: None Help from another person bathing (including washing, rinsing, drying)?: None Help from another person to put on and taking off regular upper body clothing?: None Help from another person to put on and taking off regular lower body clothing?: None 6 Click Score: 24    End of Session    OT Visit Diagnosis: Low vision, both eyes (H54.2)   Activity Tolerance Patient tolerated treatment well   Patient Left in chair;with call bell/phone within reach   Nurse Communication          Time: 3875-6433 OT Time Calculation (min): 16  min  Charges: OT General Charges $OT Visit: 1 Visit OT Treatments $Self Care/Home Management : 8-22 mins    Hortencia Pilar 05/19/2017, 12:52 PM

## 2017-05-19 NOTE — Progress Notes (Signed)
STROKE TEAM PROGRESS NOTE   SUBJECTIVE (INTERVAL HISTORY) No family  is at the bedside. Patient is found laying in bed in NAD  Overall she feels her condition is unchanged. Voices no new complaints. No new events reported overnight.  OBJECTIVE Lab Results: CBC:  Recent Labs  Lab 05/16/17 1153 05/16/17 1238  WBC 8.2  --   HGB 13.0 13.6  HCT 40.1 40.0  MCV 87.0  --   PLT 237  --    BMP: Recent Labs  Lab 05/16/17 1153 05/16/17 1238 05/17/17 0316 05/18/17 0320 05/18/17 1432  NA 138 142 139 137 138  K 4.1 4.3 3.9 4.3 4.1  CL 107 111 106 110 110  CO2 21*  --  23 21* 22  GLUCOSE 78 72 81 95 81  BUN 17 21* 17 21* 17  CREATININE 1.64* 1.60* 1.48* 1.84* 1.49*  CALCIUM 9.7  --  9.5 8.8* 9.0   Liver Function Tests:  Recent Labs  Lab 05/16/17 1153 05/17/17 0316  AST 21 19  ALT 24 20  ALKPHOS 69 66  BILITOT 0.6 0.6  PROT 7.8 7.0  ALBUMIN 4.2 3.8   Coagulation Studies:  Recent Labs    05/16/17 1153  APTT 28  INR 1.05    PHYSICAL EXAM Temp:  [97.6 F (36.4 C)-98.3 F (36.8 C)] 97.6 F (36.4 C) (12/08 0400) Pulse Rate:  [59-75] 59 (12/08 0400) Resp:  [18-20] 18 (12/08 0400) BP: (120-150)/(60-84) 122/61 (12/08 0400) SpO2:  [97 %-100 %] 100 % (12/08 0400) General - Well nourished, well developed, in no apparent distress Respiratory - Lungs clear bilaterally. No wheezing. Cardiovascular - Regular rate and rhythm  Neurological Examination Mental Status: Alert, oriented, thought content appropriate.  Speech fluent without evidence of aphasia.  Able to follow 3 step commands without difficulty. Cranial Nerves: II: Visual fields : Left eye blind, right eye : +Right eye left lower quadrantanopia. III,IV, VI: ptosis not present, extra-ocular motions intact bilaterally,  V,VII: smile asymmetric- slight Left facial droop ?Baseline finding, facial light touch sensation normal bilaterally VIII: hearing normal bilaterally IX,X: uvula rises symmetrically XI: bilateral  shoulder shrug XII: midline tongue extension Motor: Right :  Upper extremity   5/5                                      Left:     Upper extremity   5/5             Lower extremity   5/5                                                  Lower extremity   5/5 Tone and bulk:normal tone throughout; no atrophy noted Sensory: Pinprick and light touch intact throughout, bilaterally Deep Tendon Reflexes: 2+ and symmetric throughout Plantars: Right: downgoing                                Left: downgoing Cerebellar: normal finger-to-nose, normal rapid alternating movements and normal heel-to-shin test Gait: normal gait and station   IMAGING: I have personally reviewed the radiological images below and agree with the radiology interpretations. Ct Head Wo Contrast Result Date: 05/16/2017 IMPRESSION: Remote right PCA territory  infarct.  No acute abnormality. Mild atrophy and chronic microvascular ischemic change.   Mr Brain Wo Contrast Result Date: 05/16/2017 IMPRESSION: 1. Acute RIGHT caudate head lacunar infarct.  Subacute RIGHT thalamus/internal capsule infarct.  Old RIGHT occipital lobe/PCA territory infarct.  Multiple supratentorial and infratentorial old small vessel infarcts. 3. Mild parenchymal brain volume loss for age. 4. Abnormal signal LEFT ocular globe most consistent with acute to subacute blood products. Atrophic LEFT optic nerve seen with blindness. Recommend correlation with recent ophthalmologic examination. 5. 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 Differential diagnosis includes macroadenoma or, inflammatory process such as sarcoidosis with infundibular involvement. Recommend MRI of the sella with contrast on non motion stasis.   Echocardiogram:  Study Conclusions - Left ventricle: The cavity size was normal. There was mild focal   basal hypertrophy of the septum. Systolic function was normal.   The estimated ejection fraction was in the range of 55% to 60%.    Wall motion was normal; there were no regional wall motion   abnormalities. Doppler parameters are consistent with abnormal   left ventricular relaxation (grade 1 diastolic dysfunction  History per PCP records of [facility]. No show for outpatient Carotid U/S B/L Carotid U/S:05/17/2017                                                         Right Carotid: There is evidence in the right ICA of a 1-39% stenosis. Somewhat difficult evaluation due to tissue attenuation and penetration. Left Carotid: There is evidence in the left ICA of a 1-39% stenosis. Somewhat difficult evaluation due to tissue attenuation and penetration. Vertebrals: Both vertebral arteries were patent with antegrade flow _____________________________________________________________________ ASSESSMENT: [name] is a 56 y.o. female with PMH of prior CVA, Hx of uncontrolled HTN, medication and medical advice non-compliance who presents following appointment with ophthalmologist. He detected right eye visual field deficit, not present on her ophthalmological examination 6 weeks ago. 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 CT head done in the emergency room shows chronic right occipital lobe stroke. Admission complaints: The patient denies any current complaints  Daughter notes that since last Thursday the patient has experienced right-sided facial droop with drooling from the right side of her mouth as well as difficulty with swallowing and choking at times and slurred speech. MRI reveals:  Acute RIGHT caudate head lacunar infarct.  Subacute RIGHT thalamus/internal capsule infarct.  Old RIGHT occipital lobe/PCA territory infarct.  Multiple supratentorial and infratentorial old small vessel infarcts. 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  Patient did not notice any visual complaints, but stated that while she was treated for right eye glaucoma, her ophthalmologist concerning for stroke and asked her to come to ER for evaluation.  Apparently, according to notes, she was found to have new right eye visual field deficit which was not there 6 weeks ago. She did admit that she had left facial droop, slurred speech, difficulty swallowing last Friday and Sunday, but now resolved.  On examination, she had left eye completely blind, pupil dilated, corneal clouding, no pupillary reflex.  Right eye left lower quadrantanopia.  Extraocular  movement intact.  No other neuro deficit at this time.  CT showed right occipital chronic infarct.  MRI showed right acute CR/BG infarct, old right occipital, pontine, right BG infarcts.  LDL 212, A1c 5.3.  Her stroke likely due to small vessel disease, in the setting of several uncontrolled risk factors including HTN, HLD, CKD, history of coronary infarcts.  On aspirin and high-dose Lipitor.  The current acute right CR/BG stroke likely to explain her symptoms last Friday and Sunday of left facial droop, slurred speech and difficulty swallowing.  However, her right left lower quadrantanopsia likely related to her right occipital old infarct on CT and MRI.  Not sure if this happened within the last 6 weeks or chronic not recognized by her ophthalmologist.  For further workup, would like to have a CT head and neck.  Her creatinine this morning 1.48 down from 1.64 yesterday, would like to continue IV hydration and p.o. intake, check creatinine in the morning, if normalized, will proceed with CT head and neck. In addition, MRI showed 10 mm of sellar mass tenting to optic chiasm.  Although her left lower quadrantanopsia not consistent with optic chiasm compression by sellar mass, still would like to further workup with MRI of the sellar with contrast.  Pending creatinine in the morning.  05/18/17: Neuro  exam remains stable. Awaiting MRA. Creatinine trending down slowly, IVF's remain in progress. Carotid U/S negative.   STROKE:  Suspected Etiology: small vessel disease vs cardioembolic vs Right carotid stenosis  Resultant Symptoms:  Right temporal quadrantanopsia and Left facial droop- ? baseline Stroke Risk Factors: hyperlipidemia and hypertension Other Stroke Risk Factors: Advanced age, Hx stroke  Outstanding Stroke Work-up Studies: MRA w/contrast                                                       PENDING  05/18/17  PLAN  05/19/2017: Continue Aspirin/ Statin MRA w/contrast in AM if creatinine improved to assess sella mass Ongoing aggressive stroke risk factor management Patient counseled to be compliant with her antithrombotic medications PT/OT/SLP Follow up with [facility] in 6 weeks  HX OF STROKES: Remote right PCA territory infarct.  Chronic Left optic nerve occlusion, Baseline Findings:  Left eye blindness   Dysphagia Reported by daughter NPO until passes SLP evaluation  CKD: Trending down slowly Gentle IV hydration Repeat AM labs  HYPERTENSION: Stable, some elevated B/P's noted over night Permissive hypertension (OK if <220/120) for 24-48 hours post stroke and then gradually normalized within 5-7 days. Long term BP goal normotensive. May slowly restart home B/P medications after 48 hours Home Meds: Norvasc, Apresoline, Labetalol  HYPERLIPIDEMIA:    Component Value Date/Time   CHOL 304 (H) 05/17/2017 0316   TRIG 220 (H) 05/17/2017 0316   HDL 48 05/17/2017 0316   CHOLHDL 6.3 05/17/2017 0316   VLDL 44 (H) 05/17/2017 0316   LDLCALC 212 (H) 05/17/2017 0316  Home Meds:  NONE LDL  goal < 70 Started on  Lipitor to 80 mg daily Continue statin at discharge  DIABETES: Lab Results  Component Value Date   HGBA1C 5.3 05/17/2017  HgbA1c goal < 7.0  Other Active Problems: Active Problems:   CVA (cerebral vascular accident) ([place])   Glaucoma of  both eyes   Blind left eye   Essential hypertension   Hyperlipidemia  Abnormal renal function  Hospital day # 3  VTE prophylaxis: Lovenox  Diet : Diet Heart Room service appropriate? Yes; Fluid consistency: Thin   Prior Home Stroke Medications: No antithrombotic  Stroke New Meds Plan: Now on aspirin 325 mg daily and Lipitor 80 mg  Discharge Stroke Meds: Please discharge patient on  aspirin 325 mg daily and Lipitor 80 mg   Disposition: 01-Home or Self Care Therapy Recs:  PENDING Follow Recs:  Follow-up Information    Rosalin Hawking, MD. Schedule an appointment as soon as possible for a visit in 6 week(s).   Specialty:  Neurology Contact information: [address] [place] [address] [phone]          Scot Jun, [place]- PCP in 1-2 weeks  FAMILY UPDATES: No family at bedside  TEAM UPDATES: Georgette Shell, MD  [facility] Stroke Neurology Team 05/19/2017 8:03 AM  Attending note: I reviewed above note and agree with the assessment and plan. I have made any additions or clarifications directly to the above note. Pt was seen and examined.   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  Patient did not notice any visual complaints, but stated that while she was treated for right eye glaucoma, her ophthalmologist concerning for stroke and asked her to come to ER for evaluation.  Apparently, according to notes, she was found to have new right eye visual field deficit which was not there 6 weeks ago.   She did admit that she had left facial droop, slurred speech, difficulty swallowing last Friday and Sunday, but now resolved.  On examination, she had left eye completely blind, pupil dilated, corneal clouding, no pupillary reflex.  Right eye left lower quadrantanopia.  Extraocular movement intact.  No other neuro  deficit at this time.  CT showed right occipital chronic infarct.  MRI showed right acute CR/BG infarct, old right occipital, pontine, right BG infarcts. Carotid doppler showed no ICA stenosis. EF 55-60%.  LDL 212, A1c 5.3.  Her stroke likely due to small vessel disease, in the setting of several uncontrolled risk factors including HTN, HLD, CKD, history of coronary infarcts.  On aspirin and high-dose Lipitor.  The current acute right CR/BG stroke likely to explain her symptoms last Friday and Sunday of left facial droop, slurred speech and difficulty swallowing.  However, her right left lower quadrantanopsia likely related to her right occipital old infarct on CT and MRI.  Not sure if this happened within the last 6 weeks or chronic not recognized by her ophthalmologist. Currently pending MRA head.   In addition, MRI showed 10 mm of sellar mass tenting to optic chiasm.  Although her left lower quadrantanopsia not consistent with optic chiasm compression by sellar mass, still would like to further workup with MRI of the sellar with contrast. MRI of sellar currently pending.     To contact Stroke Continuity provider, please refer to [email or web address] After hours, contact General Neurology

## 2017-05-19 NOTE — Progress Notes (Signed)
Discharge orders received.  Discharge instructions and follow-up appointments reviewed with the patient and her brother.  VSS upon discharge.  IV removed and education complete.  All belongings sent with the patient.  Transported out via wheelchair.   Cori Razor, RN

## 2017-05-19 NOTE — Progress Notes (Signed)
PROGRESS NOTE    Valerie Coleman  ASN:[id number] DOB: 11-25-1960 DOA: 05/16/2017 PCP: Scot Jun, FNP   Brief Narrative:56 y.o.femalewith medical history significant ofhypertension admitted with right facial droop difficulty swallowing and difficulty speaking 5 days ago. Patient lives alone. This was reported by her daughter. Patient saw her ophthalmologist today patient has chronic right retinal vein occlusion and has lost sightin the left eye. At this time patient denies any complaints complaints of headache she has chronic left eye vision loss the ophthalmologist thought she has some vertical cut in her right eye. Patient so patient was sent to the ER from the ophthalmology office.Remote right PCA territory infarct. No acute abnormality  Mild atrophy and chronic microvascular ischemic change.mri1. Acute RIGHT caudate head lacunar infarct. Subacute RIGHT thalamus/internal capsule infarct. 2. Old RIGHT occipital lobe/PCA territory infarct. Multiple supratentorial and infratentorial old small vessel infarcts. 3. Mild parenchymal brain volume loss for age. 4. Abnormal signal LEFT ocular globe most consistent with acute to subacute blood products. Atrophic LEFT optic nerve seen with blindness. Recommend correlation with recent ophthalmologic examination. 5. 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 Differential diagnosis includes macroadenoma or, inflammatory process such as sarcoidosis with infundibular involvement. Recommend MRI of the sella with contrast on non motion stasis.    Assessment & Plan:   Active Problems:   CVA (cerebral vascular accident) ([place])   Glaucoma of both eyes   Blind left eye   Essential hypertension   Hyperlipidemia   Abnormal renal function   1]acute lacunar infart 2]old occipital lobe pca infarct 3]uncontrolled htn  4]ckd improving with ivf  Plan work up pending.continue asa and statin.increase ivf.follow up renal  functions.MRI to be done today.     DVT prophylaxis:lovenox Code Status: full Family Communication:none Consultants:  neuro  Procedures: none Antimicrobials: none Subjective:no new complaints  Objective:resting in bed . Vitals:   05/19/17 0100 05/19/17 0400 05/19/17 0947 05/19/17 1356  BP: 120/60 122/61 (!) 143/92 135/67  Pulse: (!) 59 (!) 59 62 68  Resp: 18 18 18 18   Temp: 97.7 F (36.5 C) 97.6 F (36.4 C) 97.8 F (36.6 C) 98.1 F (36.7 C)  TempSrc: Oral Oral Oral Oral  SpO2: 97% 100% 99% 100%  Weight:      Height:        Intake/Output Summary (Last 24 hours) at 05/19/2017 1527 Last data filed at 05/19/2017 1300 Gross per 24 hour  Intake 320 ml  Output -  Net 320 ml   Filed Weights   05/16/17 2133  Weight: 89.8 kg (197 lb 15.6 oz)    Examination:  General exam: Appears calm and comfortable  Respiratory system: Clear to auscultation. Respiratory effort normal. Cardiovascular system: S1 & S2 heard, RRR. No JVD, murmurs, rubs, gallops or clicks. No pedal edema. Gastrointestinal system: Abdomen is nondistended, soft and nontender. No organomegaly or masses felt. Normal bowel sounds heard. Central nervous system: Alert and oriented. No focal neurological deficits. Extremities: Symmetric 5 x 5 power. Skin: No rashes, lesions or ulcers Psychiatry: Judgement and insight appear normal. Mood & affect appropriate.     Data Reviewed: I have personally reviewed following labs and imaging studies  CBC: Recent Labs  Lab 05/16/17 1153 05/16/17 1238  WBC 8.2  --   NEUTROABS 5.3  --   HGB 13.0 13.6  HCT 40.1 40.0  MCV 87.0  --   PLT 237  --    Basic Metabolic Panel: Recent Labs  Lab 05/16/17 1153 05/16/17 1238 05/17/17 0316 05/18/17  0320 05/18/17 1432 05/19/17 0738  NA 138 142 139 137 138 137  K 4.1 4.3 3.9 4.3 4.1 4.1  CL 107 111 106 110 110 109  CO2 21*  --  23 21* 22 19*  GLUCOSE 78 72 81 95 81 77  BUN 17 21* 17 21* 17 11  CREATININE 1.64* 1.60*  1.48* 1.84* 1.49* 1.38*  CALCIUM 9.7  --  9.5 8.8* 9.0 9.4   GFR: Estimated Creatinine Clearance: 56 mL/min (A) (by C-G formula based on SCr of 1.38 mg/dL (H)). Liver Function Tests: Recent Labs  Lab 05/16/17 1153 05/17/17 0316  AST 21 19  ALT 24 20  ALKPHOS 69 66  BILITOT 0.6 0.6  PROT 7.8 7.0  ALBUMIN 4.2 3.8   No results for input(s): LIPASE, AMYLASE in the last 168 hours. No results for input(s): AMMONIA in the last 168 hours. Coagulation Profile: Recent Labs  Lab 05/16/17 1153  INR 1.05   Cardiac Enzymes: No results for input(s): CKTOTAL, CKMB, CKMBINDEX, TROPONINI in the last 168 hours. BNP (last 3 results) No results for input(s): PROBNP in the last 8760 hours. HbA1C: Recent Labs    05/17/17 0316  HGBA1C 5.3   CBG: Recent Labs  Lab 05/16/17 1159  GLUCAP 72   Lipid Profile: Recent Labs    05/17/17 0316  CHOL 304*  HDL 48  LDLCALC 212*  TRIG 220*  CHOLHDL 6.3   Thyroid Function Tests: No results for input(s): TSH, T4TOTAL, FREET4, T3FREE, THYROIDAB in the last 72 hours. Anemia Panel: No results for input(s): VITAMINB12, FOLATE, FERRITIN, TIBC, IRON, RETICCTPCT in the last 72 hours. Sepsis Labs: No results for input(s): PROCALCITON, LATICACIDVEN in the last 168 hours.  No results found for this or any previous visit (from the past 240 hour(s)).       Radiology Studies: US Renal  Result Date: 05/18/2017 CLINICAL DATA:  Renal insufficiency EXAM: RENAL / URINARY TRACT ULTRASOUND COMPLETE COMPARISON:  None. FINDINGS: Right Kidney: Length: 9.4 cm. Echogenicity within normal limits. There is renal cortical thinning. No mass, perinephric fluid, or hydronephrosis visualized. No sonographically demonstrable calculus or ureterectasis. Left Kidney: Length: 11.0 cm. Echogenicity within normal limits. There is renal cortical thinning. No perinephric fluid or hydronephrosis visualized. There is a cyst in the upper pole the left kidney measuring 1.1 x 1.1 x 1.6  cm. There is a cyst in the mid left kidney measuring 1.8 x 1.7 x 1.2 cm. No sonographically demonstrable calculus or ureterectasis. Bladder: Appears normal for degree of bladder distention. IMPRESSION: Renal cortical thinning, a finding that may be seen with medical renal disease. Renal echogenicity bilaterally is within normal limits. No obstructing foci in either kidney. Small cysts are noted in the left kidney. Electronically Signed   By: Lowella Grip III M.D.   On: 05/18/2017 19:45        Scheduled Meds: . amLODipine  10 mg Oral Daily  . aspirin  325 mg Oral Daily  . atorvastatin  80 mg Oral q1800  . brimonidine  1 drop Left Eye TID  . dorzolamide  1 drop Left Eye TID  . enoxaparin (LOVENOX) injection  40 mg Subcutaneous Q24H  . hydrALAZINE  25 mg Oral TID  . labetalol  100 mg Oral TID  . senna-docusate  1 tablet Oral QHS  . timolol  1 drop Left Eye BID   Continuous Infusions: . sodium chloride 150 mL/hr at 05/19/17 0443     LOS: 3 days  Georgette Shell, MD [facility]  If 7PM-7AM, please contact night-coverage [email or web address] Password [facility] 05/19/2017, 3:27 PM

## 2017-05-19 NOTE — Care Management Note (Signed)
Case Management Note Original Note Created Pollie Friar, RN 05/17/2017, 12:00 PM   Patient Details  Name: Valerie Coleman MRN: [id number] Date of Birth: [birth year]  Subjective/Objective:  Pt admitted with CVA. She is from home alone. No insurance listed but does have a PCP.                  Action/Plan: PT recommending [place] services. Awaiting OT eval. CM following for d/c needs, physician orders.  Addendum: CM provided her resources for PCP and medication assistance in [facility]. CM will follow to assist with medication cost at d/c.   Expected Discharge Date:  05/19/17               Expected Discharge Plan:  [place]  In-House Referral:     Discharge planning Services  CM Consult  Post Acute Care Choice:  Home Health Choice offered to:  Patient  DME Arranged:    DME Agency:     HH Arranged:  PT, OT [place] Agency:  [place]  Status of Service:  Completed, signed off  If discussed at [place] of Stay Meetings, dates discussed:    Discharge Disposition: home/home health   Additional Comments:  05/19/17- 1700- Marvetta Gibbons RN.CM- call received from unit- pt for d/c home today- [place] orders placed for HHPT/OT- went to room to speak with pt- pt's bother there- however pt down having MRI done- referral called to [facility] for possible [facility] services under charity care- pt with one script that should be $9 at [facility].    Dahlia Client [place], RN 05/19/2017, 5:00 PM [phone]

## 2017-05-19 NOTE — Discharge Summary (Addendum)
Physician Discharge Summary  Valerie Coleman PFX:[id number] DOB: Jun 09, 1961 DOA: 05/16/2017  PCP: Scot Jun, FNP  Admit date: 05/16/2017 Discharge date: 05/19/2017  Admitted From: HOME Disposition: HOME Recommendations for Outpatient Follow-up:  1. Follow up with PCP in 1-2 weeks 2. Please obtain BMP/CBC in one week Please follow up on the following pending MRI Home Health:PTOT Equipment/Devices:NONE Discharge [place] recommendation:CARDIAC  Brief/Interim Summary:56 y.o.femalewith medical history significant ofhypertension admitted with right facial droop difficulty swallowing and difficulty speaking 5 days ago. Patient lives alone. This was reported by her daughter. Patient saw her ophthalmologist today patient has chronic right retinal vein occlusion and has lost sightin the left eye. At this time patient denies any complaints complaints of headache she has chronic left eye vision loss the ophthalmologist thought she has some vertical cut in her right eye. Patient so patient was sent to the ER from the ophthalmology office.Remote right PCA territory infarct. No acute abnormality  Mild atrophy and chronic microvascular ischemic change.mri1. Acute RIGHT caudate head lacunar infarct. Subacute RIGHT thalamus/internal capsule infarct. 2. Old RIGHT occipital lobe/PCA territory infarct. Multiple supratentorial and infratentorial old small vessel infarcts. 3. Mild parenchymal brain volume loss for age. 4. Abnormal signal LEFT ocular globe most consistent with acute to subacute blood products. Atrophic LEFT optic nerve seen with blindness. Recommend correlation with recent ophthalmologic examination. 5. 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 Differential diagnosis includes macroadenoma or, inflammatory process such as sarcoidosis with infundibular involvement. Recommend MRI of the sella with contrast on non motion  stasis.      Discharge Diagnoses:  Active Problems:   CVA (cerebral vascular accident) ([place])   Glaucoma of both eyes   Blind left eye   Essential hypertension   Hyperlipidemia   Abnormal renal function  1]acute lacunar infart 2]old occipital lobe pca infarct 3]uncontrolled htn  4]ckd improving with ivf  PLAN -[place].[facility] PTOT.  Discharge Instructions  Discharge Instructions    Ambulatory referral to Neurology   Complete by:  As directed    An appointment is requested in approximately: 6 weeks Follow up with stroke clinic Cecille Rubin preferred, if not available, then consider [facility] Chestnut, [facility] or Jaynee Eagles whoever is available) at [facility] in about 6-8 weeks. Thanks.     Allergies as of 05/19/2017   No Known Allergies     Medication List    STOP taking these medications   Blood Pressure Kit   potassium chloride 10 MEQ tablet Commonly known as:  K-DUR     TAKE these medications   amLODipine 10 MG tablet Commonly known as:  NORVASC Take 1 tablet (10 mg total) by mouth daily.   aspirin 325 MG tablet Take 1 tablet (325 mg total) by mouth daily. Start taking on:  05/20/2017   atorvastatin 80 MG tablet Commonly known as:  LIPITOR Take 1 tablet (80 mg total) by mouth daily at 6 PM.   brimonidine 0.2 % ophthalmic solution Commonly known as:  ALPHAGAN Place 1 drop into the left eye 3 (three) times daily.   dorzolamide 2 % ophthalmic solution Commonly known as:  TRUSOPT Place 1 drop into the left eye 3 (three) times daily.   hydrALAZINE 25 MG tablet Commonly known as:  APRESOLINE Take 1 tablet (25 mg total) by mouth 3 (three) times daily.   labetalol 200 MG tablet Commonly known as:  NORMODYNE TAKE ONE (1) TABLET THREE (3) TIMES EACH DAY   sertraline 25 MG tablet Commonly known as:  ZOLOFT Take 1 tablet (25 mg total) by mouth daily.   timolol 0.5 % ophthalmic solution Commonly known as:  TIMOPTIC Place 1 drop into the left eye 2 (two) times  daily.   tobramycin 0.3 % ophthalmic solution Commonly known as:  TOBREX Place 1 drop into the left eye every 6 (six) hours. Place one drop in the affected eye every 4 hours for 7 days      Follow-up Information    Rosalin Hawking, MD. Schedule an appointment as soon as possible for a visit in 6 week(s).   Specialty:  Neurology Contact information: [address] [place] [address] [phone]          No Known Allergies  Consultations:  NEURO   Procedures/Studies: Ct Head Wo Contrast  Result Date: 05/16/2017 CLINICAL DATA:  Possible stroke this morning based on an eye exam. EXAM: CT HEAD WITHOUT CONTRAST TECHNIQUE: Contiguous axial images were obtained from the base of the skull through the vertex without intravenous contrast. COMPARISON:  None. FINDINGS: Brain: The patient has a remote appearing right PCA territory infarct. There is some chronic microvascular ischemic change and mild atrophy. No evidence of acute abnormality including hemorrhage, mass lesion, midline shift or abnormal extra-axial fluid collection. No hydrocephalus or pneumocephalus. Vascular: No hyperdense vessel or unexpected calcification. Skull: Intact. Sinuses/Orbits: Negative. Other: None. IMPRESSION: Remote right PCA territory infarct.  No acute abnormality. Mild atrophy and chronic microvascular ischemic change. Electronically Signed   By: Inge Rise M.D.   On: 05/16/2017 12:17   Mr Brain Wo Contrast  Result Date: 05/16/2017 CLINICAL DATA:  Difficulty swallowing, difficulty speaking and RIGHT facial droop for 5 days. RIGHT vision loss. History of hypertension. EXAM: MRI HEAD WITHOUT CONTRAST TECHNIQUE: Multiplanar, multiecho pulse sequences of the brain and surrounding structures were obtained without intravenous contrast. COMPARISON:  CT HEAD May 16, 2017 at 1201 hours FINDINGS: Mild motion degraded examination. BRAIN: 3 mm reduced diffusion RIGHT caudate head with low ADC values.  Faint 13 x 21 mm reduced diffusion RIGHT thalamus/posterior limb of the internal capsule with normalized ADC values. RIGHT posterior occipital lobe encephalomalacia. Old small bilateral cerebellar, bilateral pontine, bilateral basal ganglia and bilateral thalamus infarcts. Mild ex vacuo dilatation RIGHT lateral ventricle. Mild general prominence of ventricles and sulci for patient's age. No hydrocephalus. Punctate LEFT occipital chronic microhemorrhage. Scattered supratentorial white matter FLAIR T2 hyperintensities. No midline shift, mass effect or masses. No abnormal extra-axial fluid collections. VASCULAR: Normal major intracranial vascular flow voids present at skull base. 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 No suspicious calvarial bone marrow signal. Craniocervical junction maintained. SINUSES/ORBITS: The mastoid air-cells and included paranasal sinuses are well-aerated. Abnormal mildly increased T1 signal LEFT ocular globe, which does not suppress on FLAIR sequence. Atrophic T2 bright LEFT optic nerve. Status post RIGHT ocular lens implant. OTHER: 8 mm reduced diffusion LEFT parotid gland with slight chemical shift artifact most consistent with lymph node. IMPRESSION: 1. Acute RIGHT caudate head lacunar infarct. Subacute RIGHT thalamus/internal capsule infarct. 2. Old RIGHT occipital lobe/PCA territory infarct. Multiple supratentorial and infratentorial old small vessel infarcts. 3. Mild parenchymal brain volume loss for age. 4. Abnormal signal LEFT ocular globe most consistent with acute to subacute blood products. Atrophic LEFT optic nerve seen with blindness. Recommend correlation with recent ophthalmologic examination. 5. 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 Differential diagnosis includes macroadenoma or, inflammatory process such as sarcoidosis with infundibular involvement. Recommend  MRI  of the sella with contrast on non motion stasis. Electronically Signed   By: Elon Alas M.D.   On: 05/16/2017 21:01   US Renal  Result Date: 05/18/2017 CLINICAL DATA:  Renal insufficiency EXAM: RENAL / URINARY TRACT ULTRASOUND COMPLETE COMPARISON:  None. FINDINGS: Right Kidney: Length: 9.4 cm. Echogenicity within normal limits. There is renal cortical thinning. No mass, perinephric fluid, or hydronephrosis visualized. No sonographically demonstrable calculus or ureterectasis. Left Kidney: Length: 11.0 cm. Echogenicity within normal limits. There is renal cortical thinning. No perinephric fluid or hydronephrosis visualized. There is a cyst in the upper pole the left kidney measuring 1.1 x 1.1 x 1.6 cm. There is a cyst in the mid left kidney measuring 1.8 x 1.7 x 1.2 cm. No sonographically demonstrable calculus or ureterectasis. Bladder: Appears normal for degree of bladder distention. IMPRESSION: Renal cortical thinning, a finding that may be seen with medical renal disease. Renal echogenicity bilaterally is within normal limits. No obstructing foci in either kidney. Small cysts are noted in the left kidney. Electronically Signed   By: Lowella Grip III M.D.   On: 05/18/2017 19:45    (Echo, Carotid, EGD, Colonoscopy, ERCP)    Subjective:   Discharge Exam: Vitals:   05/19/17 0947 05/19/17 1356  BP: (!) 143/92 135/67  Pulse: 62 68  Resp: 18 18  Temp: 97.8 F (36.6 C) 98.1 F (36.7 C)  SpO2: 99% 100%   Vitals:   05/19/17 0100 05/19/17 0400 05/19/17 0947 05/19/17 1356  BP: 120/60 122/61 (!) 143/92 135/67  Pulse: (!) 59 (!) 59 62 68  Resp: _0 Temp: 97.7 F (36.5 C) 97.6 F (36.4 C) 97.8 F (36.6 C) 98.1 F (36.7 C)  TempSrc: Oral Oral Oral Oral  SpO2: 97% 100% 99% 100%  Weight:      Height:        General: Pt is alert, awake, not in acute distress Cardiovascular: RRR, S1/S2 +, no rubs, no gallops Respiratory: CTA bilaterally, no wheezing, no  rhonchi Abdominal: Soft, NT, ND, bowel sounds + Extremities: no edema, no cyanosis    The results of significant diagnostics from this hospitalization (including imaging, microbiology, ancillary and laboratory) are listed below for reference.     Microbiology: No results found for this or any previous visit (from the past 240 hour(s)).   Labs: BNP (last 3 results) No results for input(s): BNP in the last 8760 hours. Basic Metabolic Panel: Recent Labs  Lab 05/16/17 1153 05/16/17 1238 05/17/17 0316 05/18/17 0320 05/18/17 1432 05/19/17 0738  NA 138 142 139 137 138 137  K 4.1 4.3 3.9 4.3 4.1 4.1  CL 107 111 106 110 110 109  CO2 21*  --  23 21* 22 19*  GLUCOSE 78 72 81 95 81 77  BUN 17 21* 17 21* 17 11  CREATININE 1.64* 1.60* 1.48* 1.84* 1.49* 1.38*  CALCIUM 9.7  --  9.5 8.8* 9.0 9.4   Liver Function Tests: Recent Labs  Lab 05/16/17 1153 05/17/17 0316  AST 21 19  ALT 24 20  ALKPHOS 69 66  BILITOT 0.6 0.6  PROT 7.8 7.0  ALBUMIN 4.2 3.8   No results for input(s): LIPASE, AMYLASE in the last 168 hours. No results for input(s): AMMONIA in the last 168 hours. CBC: Recent Labs  Lab 05/16/17 1153 05/16/17 1238  WBC 8.2  --   NEUTROABS 5.3  --   HGB 13.0 13.6  HCT 40.1 40.0  MCV 87.0  --  PLT 237  --    Cardiac Enzymes: No results for input(s): CKTOTAL, CKMB, CKMBINDEX, TROPONINI in the last 168 hours. BNP: Invalid input(s): POCBNP CBG: Recent Labs  Lab 05/16/17 1159  GLUCAP 72   D-Dimer No results for input(s): DDIMER in the last 72 hours. Hgb A1c Recent Labs    05/17/17 0316  HGBA1C 5.3   Lipid Profile Recent Labs    05/17/17 0316  CHOL 304*  HDL 48  LDLCALC 212*  TRIG 220*  CHOLHDL 6.3   Thyroid function studies No results for input(s): TSH, T4TOTAL, T3FREE, THYROIDAB in the last 72 hours.  Invalid input(s): FREET3 Anemia work up No results for input(s): VITAMINB12, FOLATE, FERRITIN, TIBC, IRON, RETICCTPCT in the last 72  hours. Urinalysis    Component Value Date/Time   LABSPEC <=1.005 01/01/2017 1531   PHURINE 5.5 01/01/2017 1531   GLUCOSEU NEGATIVE 01/01/2017 1531   HGBUR TRACE (A) 01/01/2017 1531   BILIRUBINUR NEGATIVE 01/01/2017 1531   KETONESUR NEGATIVE 01/01/2017 1531   PROTEINUR 100 (A) 01/01/2017 1531   UROBILINOGEN 0.2 01/01/2017 1531   NITRITE NEGATIVE 01/01/2017 1531   LEUKOCYTESUR SMALL (A) 01/01/2017 1531   Sepsis Labs Invalid input(s): PROCALCITONIN,  WBC,  LACTICIDVEN Microbiology No results found for this or any previous visit (from the past 240 hour(s)).   Time coordinating discharge: Over 30 minutes  SIGNED:   Georgette Shell, MD  [facility] 05/19/2017, 4:29 PM If 7PM-7AM, please contact night-coverage [email or web address] Password [email or web address]

## 2017-05-22 ENCOUNTER — Other Ambulatory Visit: Payer: Self-pay | Admitting: Family Medicine

## 2017-05-22 ENCOUNTER — Telehealth: Payer: Self-pay

## 2017-05-22 MED ORDER — ASPIRIN 325 MG PO TABS: 325.0000 mg | ORAL_TABLET | Freq: Every day | ORAL | 0 refills | Status: DC

## 2017-05-22 MED ORDER — ATORVASTATIN CALCIUM 80 MG PO TABS: 80.0000 mg | ORAL_TABLET | Freq: Every day | ORAL | 0 refills | Status: DC

## 2017-05-22 NOTE — Telephone Encounter (Signed)
Requested medications refilled with note no additional refills until patient has an office visit

## 2017-05-23 ENCOUNTER — Telehealth: Payer: Self-pay

## 2017-05-23 NOTE — Telephone Encounter (Signed)
Called Ms. Dearden regarding her recent request made to Emelda Fear RN coordinator with the [facility]. Client states she is unable to pay for her medications of Amlodipine, Aspirin, Atorvastatin, hydralazine, labetalol. Client states she has her eye drops.  Client has no insurance and currently has no income. She lives alone and has 2 brothers who help support her with utilities. She does receive food stamps. Client states she was discharged from [facility] on 05/19/17 after suffering a "stroke" . Client states she is blind in her left eye and her vision has been affected in her right eye from her stroke. She states she will be having home PT and OT starting in the home today. Client states her primary care currently is with Molli Barrows FNP in [place] and that she pays someone to take her to those visits. She states she has no co-pays for those visits and reports she will be calling this week to make a follow up appointment with Molli Barrows FNP post hospital admission. She also reports she is to follow up with neurologist in 6 weeks in [place].  Client reports she does not have the 25$ to pay for her blood pressure medicines today nor her Lipitor and that she will be out of them starting today.   RN asked the client if she would be willing to be referred to [facility] [place] that could come to her home and help connect her to services she may need that would include a Education officer, museum and community paramedics and that this would be separate from her [place] PT and OT. Client gave verbal permission to be referred to the [facility].  RN will call her pharmacy in [place] [facility] to confirm her medications are ready and cost and to assist with a one time assistance. Client states understanding.  RN will call client back to update her regarding the referral to [facility] and also regarding her medications.  Client  is agreeable to this plan.  [facility] [phone]

## 2017-05-23 NOTE — Telephone Encounter (Signed)
RN called to update client that she will be getting a call from [facility] to establish if they can assist her with resources and medical case management and navigation. Client appreciative. RN will follow up on next business day to confirm client was contacted.  RN also informed client that [facility] has assisted in paying a one time assistance of 25$ [facility] in [place] for her medications and that they could be picked up anytime after lunch today. Client very appreciative of the assistance.  RN will follow up with client on Thursday 05/24/17.  [facility]  [phone]

## 2017-05-28 ENCOUNTER — Telehealth: Payer: Self-pay | Admitting: Family Medicine

## 2017-05-28 MED ORDER — HYDRALAZINE HCL 25 MG PO TABS: 25.0000 mg | ORAL_TABLET | Freq: Three times a day (TID) | ORAL | 0 refills | Status: DC

## 2017-05-28 NOTE — Telephone Encounter (Signed)
Patient called because she needs a refill on her Hydralazine. Please advise.

## 2017-05-28 NOTE — Telephone Encounter (Signed)
Medication sent.

## 2017-05-29 ENCOUNTER — Other Ambulatory Visit: Payer: Self-pay

## 2017-05-29 NOTE — Patient Outreach (Signed)
[facility]) Care Management  05/29/2017  Valerie Coleman 26-Oct-1960 [id number]   EMMI: Stroke Referral date: 05/29/17 Referral source:EMMI stroke red alert Referral reason: Questions/ problems with meds: YES Day # 6  Telephone call to patient regarding EMMI stroke red alert. HIPAA verified with patient. Discussed EMMI stroke red alert program with patient.  Patient  States she has two medications that her primary MD referred. Patient states she is unable to afford the two medications. Patient states a person from social services will come to her home today to discuss applying for assistance. Patient states she has a follow up appointment scheduled with her primary MD on 06/06/17.  Patient reports she has problems sometimes with her transportation. Patient states she will also discuss this with the social service person today. RNCM gave patient 65 number for additional community resources for transportation and medication assistance.  RNCM reviewed signs and symptoms of stroke. Advised patient to call 911 for stroke like symptoms.  RNCM advised patient to keep follow up appointments with her doctors.  Patient verifies knowing how to reach her doctor after hours.  RNCM advised patient to take her medications as prescribed.  RNCM advised patient to schedule follow up with neurologist as recommended on her discharge instructions.  Patient verbalized understanding.    PLAN: RNCM will follow up with patient within 3 business days to confirm she met with social service representative.   Quinn Plowman RN,BSN,CCM [facility] Telephonic  [phone]

## 2017-05-31 ENCOUNTER — Other Ambulatory Visit: Payer: Self-pay

## 2017-05-31 NOTE — Patient Outreach (Signed)
[place]) Care Management  05/31/2017  Tearra Ouk [birth year] [id number]  EMMI: Stroke Referral date: 05/29/17 Referral source:EMMI stroke red alert  Telephone follow up call to patient regarding EMMI stroke red alert. HIPAA verified with patient. Patient states the person from social services from [facility] came to see her and will be helping her with her medications. Patient states another person will be coming to see her to help her apply for Medicaid. Patient state she is waiting for an phone call to set up appointment. Patient states home health did come out to see her.  Patient states due to her being a self pay she will not be able to afford therapy at this time.   RNCM reviewed signs/ symptoms of stroke with patient. Advised patient to call 911 for stroke like symptoms.  Advised patient to keep follow up appointment with her primary MD and report any non emergent symptoms. Patient verbalized understanding.  RNCM offered to send patient additional information to patient regarding stroke recovery.  Patient refused.   PLAN; RNCM will refer patient to care management assistant to close due to patient being assessed and having no further needs.  Quinn Plowman RN,BSN,CCM [facility] Telephonic  [phone]

## 2017-06-01 ENCOUNTER — Telehealth: Payer: Self-pay

## 2017-06-01 ENCOUNTER — Other Ambulatory Visit: Payer: Self-pay | Admitting: *Deleted

## 2017-06-01 MED ORDER — LABETALOL HCL 200 MG PO TABS: ORAL_TABLET | ORAL | 0 refills | Status: DC

## 2017-06-01 NOTE — Patient Outreach (Signed)
Patient triggered Red on [facility] Stroke Dashboard, notification sent to Juanita Futrell, RN 

## 2017-06-01 NOTE — Patient Outreach (Signed)
[facility]) Care Management  06/01/2017  Valerie Coleman 1960-07-18 [id number]   EMMI- Stroke RED ON EMMI ALERT DAY#: 9 DATE: 05/31/17 RED ALERT: Lost interest in things they used to enjoy? Yes Sad, hopeless, anxious, or empty? Yes  Outreach attempt to patient. HIPAA verified with patient. Patient confirmed, she has lost interest in things she used to enjoy. Per patient, she is completely blind in her left eye from the stroke. Patient explained, her right eye is her best eye, which has glaucoma. Patient verbalized being discouraged by "not being able to see." She wants to have interest in doing things, however she is limited by her medical conditions. Patient doesn't want to be around other people due to her medical condition. She stated, "I would rather stay home because that is what I like to do."  Patient stated, a [place] plans to visit her next week to assist with transportation, medications, and Medicaid. Patient stated, a [facility] Nurse assisted her with obtaining medications. Patient has a scheduled follow-up hospital appointment on 06/06/17. She has a scheduled Ophthalmologist appointment on 05/29/17. She spoke about having to "borrow money to pay for transportation to medical appointments. She verbalized receiving, reading, and understanding her discharge hospital instructions. She confirmed taking her medications as prescribed. Patient stated, "I will be fine".     Plan:  RN CM will notify [facility] Case Management Assistant regarding case closure.  RN CM advised patient to alert MD for any changes in conditions.    [place] Bells, RN, BSN, MHA/MSL, [place] Telephonic Care Manager Coordinator [facility] Direct Phone: [phone] Toll Free: [phone] Fax: [phone]

## 2017-06-06 ENCOUNTER — Ambulatory Visit: Payer: Self-pay | Admitting: Family Medicine

## 2017-06-08 ENCOUNTER — Ambulatory Visit: Payer: Self-pay | Admitting: Family Medicine

## 2017-06-11 ENCOUNTER — Other Ambulatory Visit: Payer: Self-pay | Admitting: Family Medicine

## 2017-06-13 ENCOUNTER — Encounter: Payer: Self-pay | Admitting: Family Medicine

## 2017-06-13 ENCOUNTER — Ambulatory Visit (INDEPENDENT_AMBULATORY_CARE_PROVIDER_SITE_OTHER): Payer: Medicaid Other | Admitting: Family Medicine

## 2017-06-13 VITALS — BP 136/78 | HR 68 | Temp 97.5°F | Resp 14 | Ht 72.0 in | Wt 204.0 lb

## 2017-06-13 DIAGNOSIS — I639 Cerebral infarction, unspecified: Secondary | ICD-10-CM

## 2017-06-13 DIAGNOSIS — N183 Chronic kidney disease, stage 3 unspecified: Secondary | ICD-10-CM

## 2017-06-13 DIAGNOSIS — I10 Essential (primary) hypertension: Secondary | ICD-10-CM

## 2017-06-13 MED ORDER — AMLODIPINE BESYLATE 10 MG PO TABS: ORAL_TABLET | ORAL | 3 refills | Status: DC

## 2017-06-13 MED ORDER — HYDRALAZINE HCL 25 MG PO TABS: 25.0000 mg | ORAL_TABLET | Freq: Three times a day (TID) | ORAL | 2 refills | Status: DC

## 2017-06-13 MED ORDER — ATORVASTATIN CALCIUM 80 MG PO TABS: 80.0000 mg | ORAL_TABLET | Freq: Every day | ORAL | 3 refills | Status: DC

## 2017-06-13 MED ORDER — LABETALOL HCL 200 MG PO TABS: ORAL_TABLET | ORAL | 2 refills | Status: DC

## 2017-06-13 NOTE — Progress Notes (Signed)
Patient ID: Valerie Coleman, female    DOB: 1960/10/23, 58 y.o.   MRN: [id number]  PCP: Scot Jun, FNP  Chief Complaint  Patient presents with  . Hospitalization Follow-up    Subjective:  HPI Valerie Coleman is a 57 y.o. female presents for evaluation of hypertension and medication management. Valerie Coleman was recently hospitalized, 05/16/2017, after suffering from an acute stroke right lunar infarct secondary to medication non-compliance. Patient has a history of multiple CVA's, uncontrolled hypertension, CKD 3 and chronic infarcts.  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 She is followed by an opthalmology specialist. She is followed by   Valerie Coleman has had poor follow-up here in office. She is uninsured and attributes financial challenges as the cause of missing PCP visits. Valerie Coleman suffers from CKD which has gradually worsened over time, and was referred to nephrology, although failed to follow-up as they requested an initial office visit fee. Today, she reports working closely with [facility] in efforts to obtain medicaid and apply for disability. Today she reports improvement of blood pressure control. She has home visit from a county nurse that monitors her blood pressure closely. Valerie Coleman states she is able to obtain all medications through a program funded by her county. She requests medication refills.  Social History   Socioeconomic History  . Marital status: Single    Spouse name: Not on file  . Number of children: Not on file  . Years of education: Not on file  . Highest education level: Not on file  Social Needs  . Financial resource strain: Not on file  . Food insecurity - worry: Not on file  . Food insecurity - inability: Not on file  . Transportation needs - medical: Not on file  . Transportation needs - non-medical: Not on file  Occupational  History  . Not on file  Tobacco Use  . Smoking status: Former Research scientist (life sciences)  . Smokeless tobacco: Never Used  Substance and Sexual Activity  . Alcohol use: No  . Drug use: No  . Sexual activity: Not on file  Other Topics Concern  . Not on file  Social History Narrative  . Not on file   Family history significant for hypertension and heart disease.   Review of Systems  Constitutional: Negative.   Respiratory: Negative.   Cardiovascular: Negative.   Endocrine: Positive for cold intolerance.  Genitourinary: Negative.   Musculoskeletal: Negative.   Neurological: Negative for dizziness and headaches.  Psychiatric/Behavioral: Negative.     Patient Active Problem List   Diagnosis Date Noted  . Abnormal renal function   . Glaucoma of both eyes   . Blind left eye   . Essential hypertension   . Hyperlipidemia   . CVA (cerebral vascular accident) ([place]) 05/16/2017  . CKD (chronic kidney disease) stage 3, GFR 30-59 ml/min ([facility]) 01/04/2017  . Central retinal vein occlusion   . Hypertensive emergency 12/26/2015    No Known Allergies  Prior to Admission medications   Medication Sig Start Date End Date Taking? Authorizing Provider  amLODipine (NORVASC) 10 MG tablet TAKE ONE (1) TABLET EACH DAY 05/22/17  Yes Scot Jun, FNP  aspirin 325 MG tablet Take 1 tablet (325 mg total) by mouth daily. 05/22/17  Yes Scot Jun, FNP  atorvastatin (LIPITOR) 80 MG tablet Take 1 tablet (80 mg total) by mouth daily at 6 PM. 05/22/17  Yes Scot Jun, FNP  brimonidine (ALPHAGAN) 0.2 % ophthalmic solution Place 1 drop into the left eye 3 (three) times daily. 12/28/15  Yes Domenic Polite, MD  dorzolamide (TRUSOPT) 2 % ophthalmic solution Place 1 drop into the left eye 3 (three) times daily. 12/28/15  Yes Domenic Polite, MD  hydrALAZINE (APRESOLINE) 25 MG tablet Take 1 tablet (25 mg total) by mouth 3 (three) times daily. 05/28/17  Yes Scot Jun, FNP  labetalol (NORMODYNE) 200 MG  tablet TAKE ONE (1) TABLET THREE (3) TIMES EACH DAY 06/01/17  Yes Scot Jun, FNP  timolol (TIMOPTIC) 0.5 % ophthalmic solution Place 1 drop into the left eye 2 (two) times daily. 12/28/15  Yes Domenic Polite, MD  tobramycin (TOBREX) 0.3 % ophthalmic solution Place 1 drop into the left eye every 6 (six) hours. Place one drop in the affected eye every 4 hours for 7 days 03/20/16  Yes Harris, Abigail, PA-C  sertraline (ZOLOFT) 25 MG tablet Take 1 tablet (25 mg total) by mouth daily. Patient not taking: Reported on 05/16/2017 01/01/17   Scot Jun, FNP    Past Medical, Surgical Family and Social History reviewed and updated.    Objective:   Today's Vitals   06/13/17 1425  BP: 136/78  Pulse: 68  Resp: 14  Temp: (!) 97.5 F (36.4 C)  TempSrc: Oral  SpO2: 100%  Weight: 204 lb (92.5 kg)  Height: 6' (1.829 m)    Wt Readings from Last 3 Encounters:  06/13/17 204 lb (92.5 kg)  05/16/17 197 lb 15.6 oz (89.8 kg)  01/01/17 200 lb 9.6 oz (91 kg)    Physical Exam  Constitutional: She is oriented to person, place, and time. She appears well-developed and well-nourished.  HENT:  Head: Normocephalic and atraumatic.  Eyes:  Left eye blindness  Photosensitivty note with exposure to bright light  Neck: Normal range of motion. No thyromegaly present.  Cardiovascular: Normal rate, regular rhythm, normal heart sounds and intact distal pulses.  Pulmonary/Chest: Effort normal and breath sounds normal.  Musculoskeletal: Normal range of motion.  Neurological: She is alert and oriented to person, place, and time. Coordination normal.  Skin: Skin is warm and dry.  Psychiatric: She has a normal mood and affect. Her behavior is normal. Judgment and thought content normal.   Assessment & Plan:  1. Cerebrovascular accident (CVA), unspecified mechanism ([place]), acute lunar right infarct. Patient will be followed by [facility], f/u appointment within 4 weeks. No obvious cognitive or  motor deficits noted during exam today. Reinforced the importance of medication compliance and good tight blood pressure control to prevent a recurrent CVA.   2. Essential hypertension, well controlled today. We have discussed target BP range and blood pressure goal. I have advised patient to check BP regularly and to call us back or report to clinic if the numbers are consistently higher than 140/90. We discussed the importance of compliance with medical therapy and DASH diet recommended, consequences of uncontrolled hypertension discussed. Continue current BP medications  3. CKD (chronic kidney disease) stage 3, GFR 30-59 ml/min ([place]), previously referred to nephrology. Suspect CKD secondary to uncontrolled hypertension. Encouraged patient to advise when Medicaid is approved in order for an updated referral to place for nephrology. Last creatinine 1.67, BUN 19, GFR 39. Repeating CMP today.  Meds ordered this encounter  Medications  . hydrALAZINE (APRESOLINE) 25 MG tablet    Sig: Take 1 tablet (25 mg total) by mouth 3 (three) times daily.    Dispense:  90 tablet  Refill:  2    Order Specific Question:   Supervising Provider    Answer:   Tresa Garter [id number]  . amLODipine (NORVASC) 10 MG tablet    Sig: TAKE ONE (1) TABLET EACH DAY    Dispense:  60 tablet    Refill:  3    Patient needs an office visit prior to receiving any further refills    Order Specific Question:   Supervising Provider    Answer:   Tresa Garter [id number]  . DISCONTD: atorvastatin (LIPITOR) 80 MG tablet    Sig: Take 1 tablet (80 mg total) by mouth daily at 6 PM.    Dispense:  90 tablet    Refill:  3    No additional refills until patient has office visit    Order Specific Question:   Supervising Provider    Answer:   Tresa Garter [id number]  . labetalol (NORMODYNE) 200 MG tablet    Sig: TAKE ONE (1) TABLET THREE (3) TIMES EACH DAY    Dispense:  90 tablet    Refill:  2    Order Specific  Question:   Supervising Provider    Answer:   Tresa Garter [id number]  . atorvastatin (LIPITOR) 80 MG tablet    Sig: Take 1 tablet (80 mg total) by mouth daily at 6 PM.    Dispense:  90 tablet    Refill:  3    Order Specific Question:   Supervising Provider    Answer:   Tresa Garter [[id number]]    Orders Placed This Encounter  Procedures  . Comprehensive metabolic panel  . CBC with Differential     A total of 25 minutes spent, greater than 50 % of this time was spent reviewing prior medical history, reviewing medications and indications of treatment, prior labs and diagnostic tests, discussing current plan of treatment, health promotion, and goals of treatment.  RTC: 6 months chronic conditions or sooner if needed.  Carroll Sage. [name], MSN, FNP-C [facility]  [address] [place], [place] [address] [phone]

## 2017-06-14 ENCOUNTER — Ambulatory Visit: Payer: Self-pay | Admitting: Family Medicine

## 2017-06-14 LAB — CBC WITH DIFFERENTIAL/PLATELET
Basophils Absolute: 0 x10E3/uL (ref 0.0–0.2)
Basos: 0 %
EOS (ABSOLUTE): 0.1 x10E3/uL (ref 0.0–0.4)
Eos: 1 %
Hematocrit: 36.2 % (ref 34.0–46.6)
Hemoglobin: 12.6 g/dL (ref 11.1–15.9)
Immature Grans (Abs): 0 x10E3/uL (ref 0.0–0.1)
Immature Granulocytes: 0 %
Lymphocytes Absolute: 2 x10E3/uL (ref 0.7–3.1)
Lymphs: 19 %
MCH: 28.4 pg (ref 26.6–33.0)
MCHC: 34.8 g/dL (ref 31.5–35.7)
MCV: 82 fL (ref 79–97)
Monocytes Absolute: 0.7 x10E3/uL (ref 0.1–0.9)
Monocytes: 7 %
Neutrophils Absolute: 7.4 x10E3/uL — ABNORMAL HIGH (ref 1.4–7.0)
Neutrophils: 73 %
Platelets: 250 x10E3/uL (ref 150–379)
RBC: 4.43 x10E6/uL (ref 3.77–5.28)
RDW: 14.6 % (ref 12.3–15.4)
WBC: 10.3 x10E3/uL (ref 3.4–10.8)

## 2017-06-14 LAB — COMPREHENSIVE METABOLIC PANEL WITH GFR
ALT: 30 IU/L (ref 0–32)
AST: 21 IU/L (ref 0–40)
Albumin/Globulin Ratio: 1.5 (ref 1.2–2.2)
Albumin: 4.7 g/dL (ref 3.5–5.5)
Alkaline Phosphatase: 94 IU/L (ref 39–117)
BUN/Creatinine Ratio: 11 (ref 9–23)
BUN: 19 mg/dL (ref 6–24)
Bilirubin Total: 0.4 mg/dL (ref 0.0–1.2)
CO2: 15 mmol/L — ABNORMAL LOW (ref 20–29)
Calcium: 9.9 mg/dL (ref 8.7–10.2)
Chloride: 105 mmol/L (ref 96–106)
Creatinine, Ser: 1.67 mg/dL — ABNORMAL HIGH (ref 0.57–1.00)
GFR calc Af Amer: 39 mL/min/1.73 — ABNORMAL LOW
GFR calc non Af Amer: 33 mL/min/1.73 — ABNORMAL LOW
Globulin, Total: 3.1 g/dL (ref 1.5–4.5)
Glucose: 78 mg/dL (ref 65–99)
Potassium: 4.5 mmol/L (ref 3.5–5.2)
Sodium: 140 mmol/L (ref 134–144)
Total Protein: 7.8 g/dL (ref 6.0–8.5)

## 2017-06-15 ENCOUNTER — Telehealth: Payer: Self-pay | Admitting: Family Medicine

## 2017-06-15 NOTE — Telephone Encounter (Signed)
Please advise patient that renal functioning is worsening compared to recent hospitalization labs.  I highly recommend that she follows up with [facility].  She was previously referred and declined follow-up.  However if another referral is warranted I will be happy to place the referral.

## 2017-06-18 NOTE — Telephone Encounter (Signed)
Tried to contact patient no vm full

## 2017-06-18 NOTE — Telephone Encounter (Signed)
Patient states that she has appointment with [facility] on 08/03/2016 but if her medicaid has not went through by then she will have to pay $200.00 dollars which she doesn't have.

## 2017-06-26 ENCOUNTER — Telehealth: Payer: Self-pay

## 2017-06-26 NOTE — Telephone Encounter (Signed)
Patient states that since she has been taking her medication her blood pressure has been running 124/62 and wanted to know if this was a good blood pressure. Patient was advise that this blood pressure was good and to keep monitoring blood pressure. Patient was advise that if she starts not feeling well to contact office

## 2017-07-04 ENCOUNTER — Ambulatory Visit: Payer: Self-pay | Admitting: Family Medicine

## 2017-08-02 NOTE — Telephone Encounter (Signed)
Medication refill

## 2017-08-03 ENCOUNTER — Telehealth: Payer: Self-pay | Admitting: *Deleted

## 2017-08-03 ENCOUNTER — Ambulatory Visit: Payer: MEDICAID | Admitting: Diagnostic Neuroimaging

## 2017-08-03 NOTE — Telephone Encounter (Signed)
Patient was no show for new patient appointment, stroke follow up.

## 2017-08-06 ENCOUNTER — Encounter: Payer: Self-pay | Admitting: Diagnostic Neuroimaging

## 2017-09-06 NOTE — Telephone Encounter (Signed)
Note not needed 

## 2017-11-12 ENCOUNTER — Encounter: Payer: Self-pay | Admitting: *Deleted

## 2017-11-13 ENCOUNTER — Ambulatory Visit: Payer: Self-pay | Admitting: Cardiology

## 2017-11-13 NOTE — Progress Notes (Deleted)
Clinical Summary Valerie Coleman is a 57 y.o.female  1. Fatigue  2. CKD 3   3. Depression  4. History of CVA - followed by neuro Past Medical History:  Diagnosis Date  . Bilateral lower extremity edema   . CKD (chronic kidney disease)   . CVA (cerebral vascular accident) ([place]) 05/2017  . Hypertension      No Known Allergies   Current Outpatient Medications  Medication Sig Dispense Refill  . amLODipine (NORVASC) 10 MG tablet TAKE ONE (1) TABLET EACH DAY 60 tablet 3  . aspirin 325 MG tablet Take 1 tablet (325 mg total) by mouth daily. 90 tablet 0  . atorvastatin (LIPITOR) 80 MG tablet Take 1 tablet (80 mg total) by mouth daily at 6 PM. 90 tablet 3  . brimonidine (ALPHAGAN) 0.2 % ophthalmic solution Place 1 drop into the left eye 3 (three) times daily. 5 mL 1  . dorzolamide (TRUSOPT) 2 % ophthalmic solution Place 1 drop into the left eye 3 (three) times daily. 10 mL 1  . hydrALAZINE (APRESOLINE) 25 MG tablet Take 1 tablet (25 mg total) by mouth 3 (three) times daily. 90 tablet 2  . labetalol (NORMODYNE) 200 MG tablet TAKE ONE (1) TABLET THREE (3) TIMES EACH DAY 90 tablet 2  . sertraline (ZOLOFT) 25 MG tablet Take 1 tablet (25 mg total) by mouth daily. (Patient not taking: Reported on 05/16/2017) 90 tablet 0  . timolol (TIMOPTIC) 0.5 % ophthalmic solution Place 1 drop into the left eye 2 (two) times daily. 10 mL 1  . tobramycin (TOBREX) 0.3 % ophthalmic solution Place 1 drop into the left eye every 6 (six) hours. Place one drop in the affected eye every 4 hours for 7 days 5 mL 0   No current facility-administered medications for this visit.      Past Surgical History:  Procedure Laterality Date  . CHOLECYSTECTOMY    . TUBAL LIGATION       No Known Allergies    Family History  Problem Relation Age of Onset  . Diabetes Mother   . Hypertension Mother   . Hypertension Father   . Hypertension Sister      Social History Valerie Coleman reports that she has quit  smoking. She has never used smokeless tobacco. Valerie Coleman reports that she does not drink alcohol.   Review of Systems CONSTITUTIONAL: No weight loss, fever, chills, weakness or fatigue.  HEENT: Eyes: No visual loss, blurred vision, double vision or yellow sclerae.No hearing loss, sneezing, congestion, runny nose or sore throat.  SKIN: No rash or itching.  CARDIOVASCULAR:  RESPIRATORY: No shortness of breath, cough or sputum.  GASTROINTESTINAL: No anorexia, nausea, vomiting or diarrhea. No abdominal pain or blood.  GENITOURINARY: No burning on urination, no polyuria NEUROLOGICAL: No headache, dizziness, syncope, paralysis, ataxia, numbness or tingling in the extremities. No change in bowel or bladder control.  MUSCULOSKELETAL: No muscle, back pain, joint pain or stiffness.  LYMPHATICS: No enlarged nodes. No history of splenectomy.  PSYCHIATRIC: No history of depression or anxiety.  ENDOCRINOLOGIC: No reports of sweating, cold or heat intolerance. No polyuria or polydipsia.  Marland Kitchen   Physical Examination There were no vitals filed for this visit. There were no vitals filed for this visit.  Gen: resting comfortably, no acute distress HEENT: no scleral icterus, pupils equal round and reactive, no palptable cervical adenopathy,  CV Resp: Clear to auscultation bilaterally GI: abdomen is soft, non-tender, non-distended, normal bowel sounds, no hepatosplenomegaly MSK: extremities  are warm, no edema.  Skin: warm, no rash Neuro:  no focal deficits Psych: appropriate affect   Diagnostic Studies  05/2017 echo Study Conclusions  - Left ventricle: The cavity size was normal. There was mild focal   basal hypertrophy of the septum. Systolic function was normal.   The estimated ejection fraction was in the range of 55% to 60%.   Wall motion was normal; there were no regional wall motion   abnormalities. Doppler parameters are consistent with abnormal   left ventricular relaxation (grade 1  diastolic dysfunction).  05/2017 carotid US Final Interpretation: Right Carotid: There is evidence in the right ICA of a 1-39% stenosis. Somewhat        difficult evaluation due to tissue attenuation and penetration.  Left Carotid: There is evidence in the left ICA of a 1-39% stenosis. Somewhat       difficult evaluation due to tissue attenuation and penetration.  Vertebrals: Both vertebral arteries were patent with antegrade flow. Assessment and Plan        Arnoldo Lenis, M.D., F.A.C.C.

## 2017-12-10 ENCOUNTER — Other Ambulatory Visit: Payer: Self-pay | Admitting: Family Medicine

## 2017-12-11 ENCOUNTER — Ambulatory Visit: Payer: Self-pay | Admitting: Family Medicine

## 2018-01-09 ENCOUNTER — Other Ambulatory Visit: Payer: Self-pay | Admitting: Family Medicine

## 2018-01-21 ENCOUNTER — Other Ambulatory Visit: Payer: Self-pay | Admitting: Family Medicine

## 2018-05-26 IMAGING — MR MR HEAD WO/W CM
11 of 21 series · 22 of 48 positions shown · IV contrast (multihance)
Comparison: MRI of the head May 16, 2017

CLINICAL DATA: Followup sellar mass and acute infarcts. History of
LEFT eye blindness.

EXAM:
MRI HEAD WITHOUT AND WITH CONTRAST
MRA HEAD WITHOUT CONTRAST
TECHNIQUE: Multiplanar, multiecho pulse sequences of the brain and surrounding
structures were obtained without and with intravenous contrast.
Angiographic images of the head were obtained using MRA technique
without contrast.
CONTRAST:  20mL MULTIHANCE GADOBENATE DIMEGLUMINE 529 MG/ML IV SOLN

[Series 3: DWI · axial · 3.0mm · 0.94mm/px · z∈[-71,+75]mm · 5 of 100 slices shown]
[im 1/100]
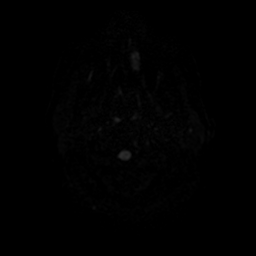
[im 25/100]
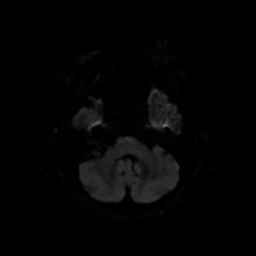
[im 50/100]
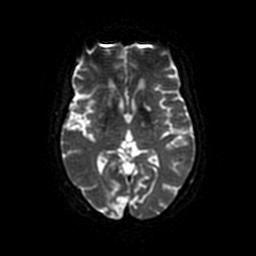
[im 75/100]
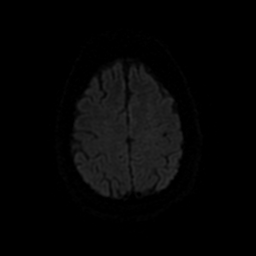
[im 100/100]
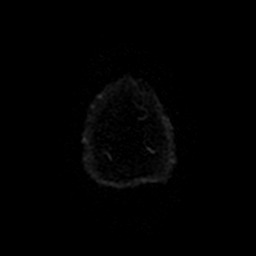

[Series 4: FLAIR · sagittal · 5.0mm · 0.47mm/px · 1 of 23 slices shown (1 of 2)]
[im 1/23]
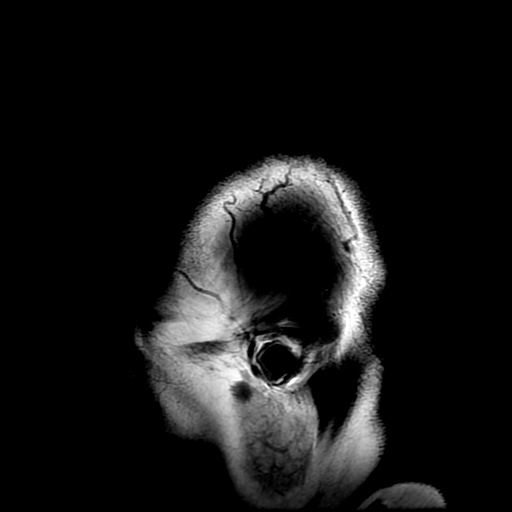

[Series 5: ax (id) 2 · axial · 1.0mm · 0.43mm/px · z∈[-59,-5]mm · 5 of 184 slices shown]
[im 1/184]
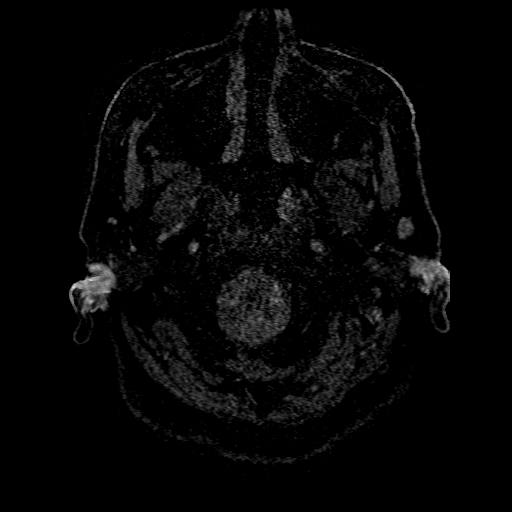
[im 37/184]
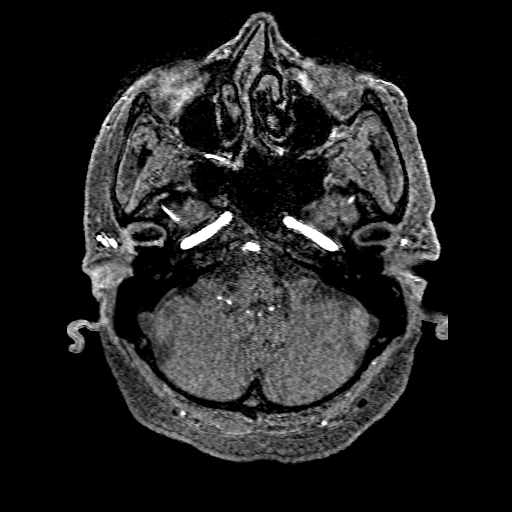
[im 55/184]
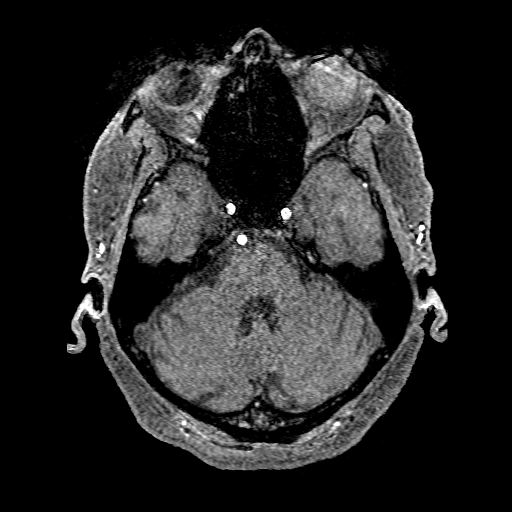
[im 74/184]
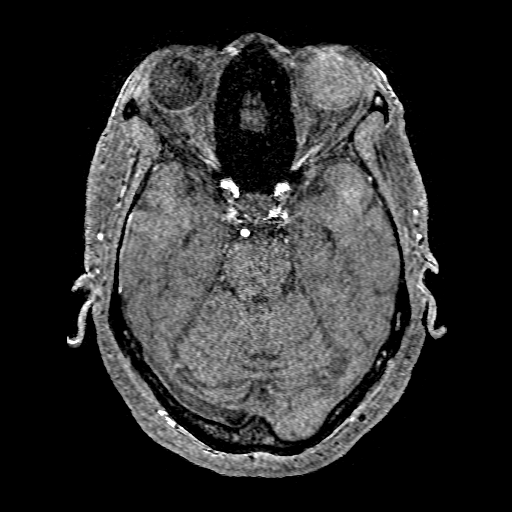
[im 110/184]
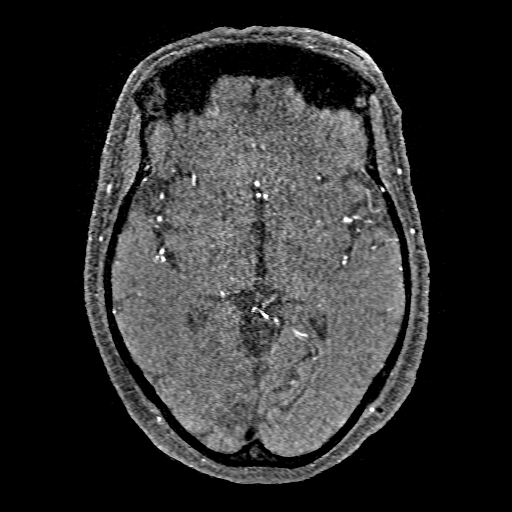

[Series 8: T2 · axial · 5.0mm · 0.47mm/px · 1 of 27 slices shown]
[im 1/27]
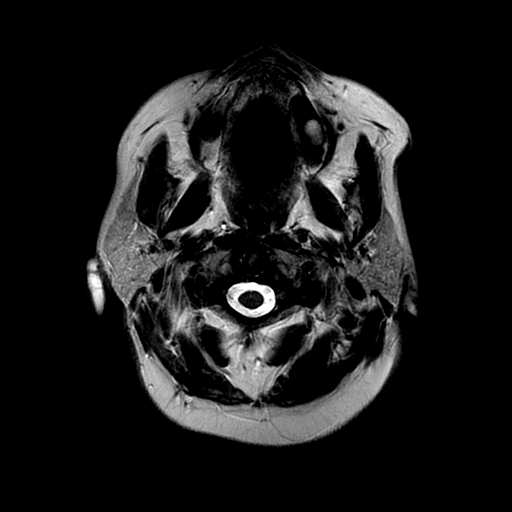

[Series 9: FLAIR · axial · 3.0mm · 0.41mm/px · 1 of 27 slices shown (2 of 2)]
[im 1/27]
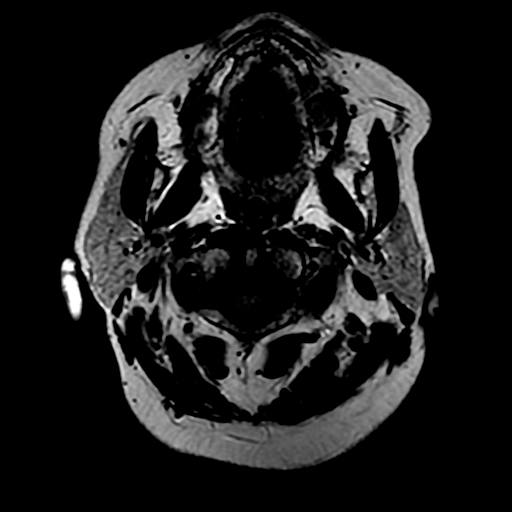

[Series 12: T1 · sagittal · 2.0mm · 0.35mm/px · 1 of 20 slices shown (1 of 5)]
[im 1/20]
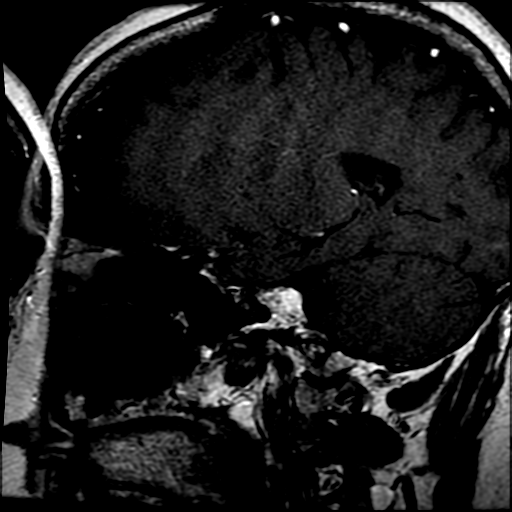

[Series 13: T1 · coronal · 3.0mm · 0.35mm/px · 1 of 19 slices shown (2 of 5)]
[im 1/19]
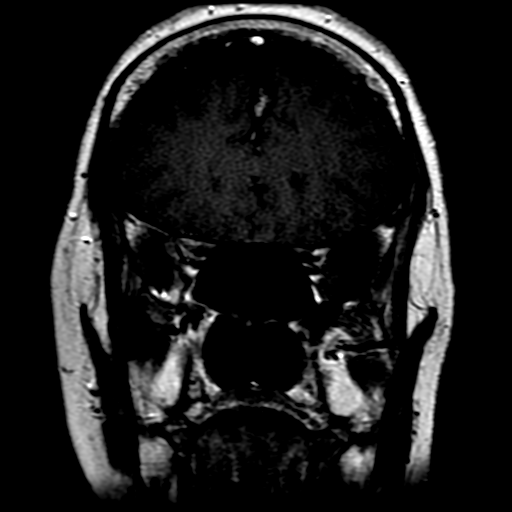

[Series 15: T1 · sagittal · 2.0mm · 0.35mm/px · 1 of 20 slices shown (3 of 5)]
[im 1/20]
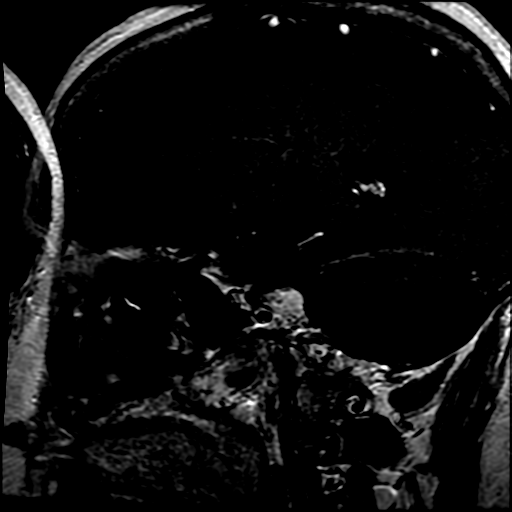

[Series 16: T1 · coronal · 3.0mm · 0.35mm/px · 1 of 19 slices shown (4 of 5)]
[im 1/19]
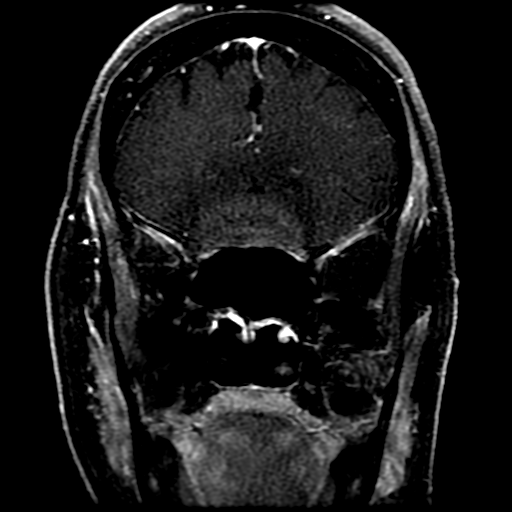

[Series 18: T1 · coronal · 5.0mm · 0.47mm/px · 2 of 30 slices shown (5 of 5)]
[im 1/30]
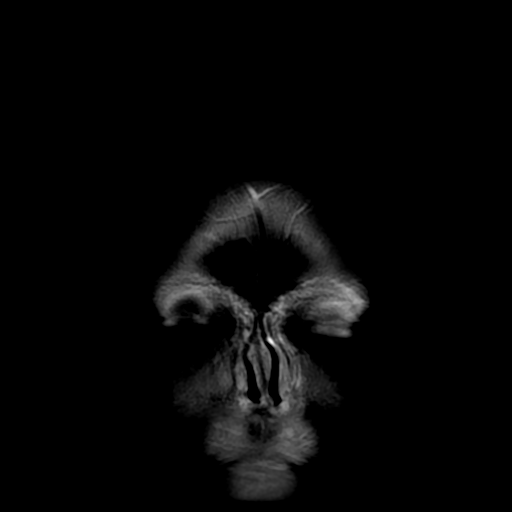
[im 30/30]
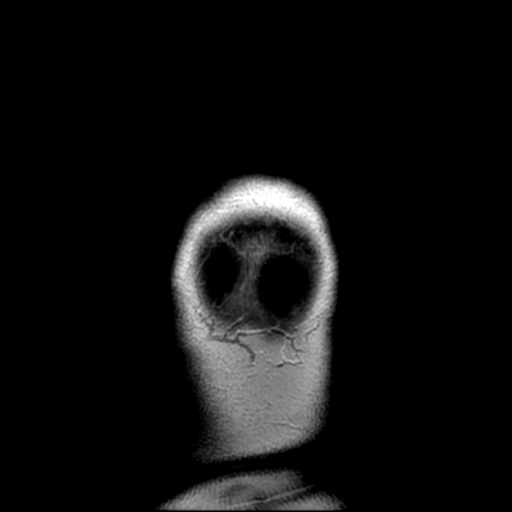

[Series 350: ADC · axial · 3.0mm · 0.94mm/px · z∈[-71,+75]mm · 3 of 50 slices shown]
[im 1/50]
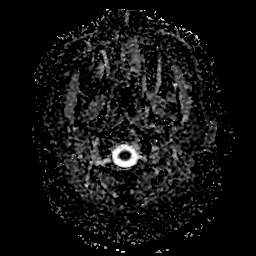
[im 25/50]
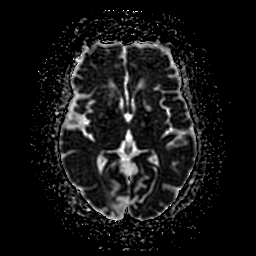
[im 50/50]
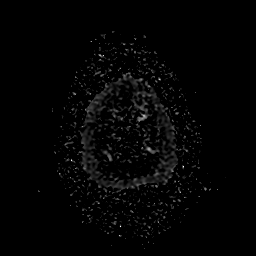

[22 of 48 positions shown; findings below may reference images not displayed]

FINDINGS: MRI HEAD FINDINGS

SELLA: No abnormal sellar expansion. Normal posterior pituitary
bright spot. No early foci of suspicious hypo enhancement.
Homogeneously enhancing, enlarged pituitary gland measuring 10 mm
contacting the inferior optic chiasm. Thickened homogeneously
enhancing midline stalk. Symmetric appearance of the cavernous
sinuses and cavernous sinus flow voids. Normal appearance of the
overlying optic chiasm.

INTRACRANIAL CONTENTS: Evolving subacute RIGHT caudate head and
RIGHT corona radiata versus thalamus/internal capsule infarcts. No
new foci of reduced diffusion. RIGHT occipital lobe encephalomalacia
with susceptibility artifact consistent with mineralization.
Scattered chronic micro hemorrhages most conspicuous within the
posterior fossa now evident, more conspicuous by 3 tesla technique.
Scattered subcentimeter supratentorial white matter FLAIR T2
hyperintensities exclusive a aforementioned abnormality. Small
cerebellar and pontine lacunar infarcts. Old bilateral basal ganglia
and bilateral thalamus lacunar infarcts. Mild parenchymal brain
volume loss for age. No hydrocephalus. No midline shift, mass effect
or masses. No abnormal extra-axial fluid collections.

VASCULAR: Normal major intracranial vascular flow voids present at
skull base.

ORBITS: T2 bright signal LEFT ocular globe which does not suppress
on FLAIR sequence seen with blindness. Status post RIGHT ocular lens
implant.

SINUSES: The mastoid air-cells and included paranasal sinuses are
well-aerated.

SKULL/SOFT TISSUES: No suspicious calvarial bone marrow signal.
Craniocervical junction maintained.

MRA HEAD FINDINGS- mildly motion degraded examination.

ANTERIOR CIRCULATION: Normal flow related enhancement of the
included cervical, petrous, cavernous and supraclinoid internal
carotid arteries. Patent anterior communicating artery. Patent
anterior and middle cerebral arteries. Severe stenosis RIGHT A1
origin. Moderate stenosis RIGHT M1 origin. Severe stenosis RIGHT M2
inferior and superior division origins. Tandem severe stenosis LEFT
M2 segment. Moderate tandem stenosis bilateral middle cerebral
artery's.

No large vessel occlusion, flow limiting stenosis, aneurysm.

POSTERIOR CIRCULATION: LEFT vertebral artery is dominant. Mildly
stenotic distal RIGHT V4 segment. Basilar artery is patent, with
normal flow related enhancement of the main branch vessels. Patent
posterior cerebral arteries, moderate tandem stenoses consistent
with atherosclerosis. Severe stenosis bilateral P2 segment

No large vessel occlusion, flow limiting stenosis,  aneurysm.

ANATOMIC VARIANTS: Supernumerary anterior cerebral artery arising
from RIGHT A1-2 junction.

Source images and MIP images were reviewed.
IMPRESSION: MRI head:

1. Enlarged pituitary gland and associated thickened pituitary
stalk. No discrete mass. Findings seen with neurosarcoidosis,
lymphocytic hypophysitis hand hyperplasia. Recommend correlation
with pituitary dysfunction.
2. Subacute to RIGHT basal ganglia and thalamus infarcts. No acute
infarct.
3. Old RIGHT occipital lobe/PCA territory infarct. Multiple old
supra and infratentorial small vessel type infarct.
4. Chronic micro hemorrhages associated with chronic hypertension,
better seen on today's 3 tesla examination.
MRA head:

1. No emergent large vessel occlusion.
2. Advanced intracranial atherosclerosis resulting in multifocal
severe stenosis including bilateral M2 segments, RIGHT A1 and
bilateral P2 segments.

## 2018-10-08 NOTE — Telephone Encounter (Signed)
Message sent to provider 

## 2020-02-05 ENCOUNTER — Encounter: Payer: Self-pay | Admitting: Family Medicine

## 2020-02-05 ENCOUNTER — Ambulatory Visit (INDEPENDENT_AMBULATORY_CARE_PROVIDER_SITE_OTHER): Payer: Medicare Other | Admitting: Family Medicine

## 2020-02-05 ENCOUNTER — Other Ambulatory Visit: Payer: Self-pay

## 2020-02-05 VITALS — BP 204/110 | HR 76 | Temp 97.0°F | Ht 72.0 in | Wt 203.0 lb

## 2020-02-05 DIAGNOSIS — I10 Essential (primary) hypertension: Secondary | ICD-10-CM

## 2020-02-05 DIAGNOSIS — N183 Chronic kidney disease, stage 3 unspecified: Secondary | ICD-10-CM | POA: Diagnosis not present

## 2020-02-05 DIAGNOSIS — Z8673 Personal history of transient ischemic attack (TIA), and cerebral infarction without residual deficits: Secondary | ICD-10-CM

## 2020-02-05 DIAGNOSIS — L304 Erythema intertrigo: Secondary | ICD-10-CM

## 2020-02-05 DIAGNOSIS — E785 Hyperlipidemia, unspecified: Secondary | ICD-10-CM

## 2020-02-05 MED ORDER — NYSTATIN 100000 UNIT/GM EX POWD: 1.0000 "application " | Freq: Three times a day (TID) | CUTANEOUS | 0 refills | Status: DC

## 2020-02-05 MED ORDER — LOSARTAN POTASSIUM 50 MG PO TABS: 50.0000 mg | ORAL_TABLET | Freq: Every day | ORAL | 2 refills | Status: DC

## 2020-02-05 MED ORDER — ATORVASTATIN CALCIUM 80 MG PO TABS: 80.0000 mg | ORAL_TABLET | Freq: Every day | ORAL | 1 refills | Status: DC

## 2020-02-05 NOTE — Progress Notes (Signed)
New Patient Office Visit  Assessment & Plan:  1. Essential hypertension - Uncontrolled. Losartan increased from 25 mg to 50 mg once daily. Patient encouraged to monitor BP daily or every other day and keep a log, which she should bring with her to her next appointment. Education provided on the DASH diet.  - hydrALAZINE (APRESOLINE) 10 MG tablet; Take 10 mg by mouth daily.  - losartan (COZAAR) 50 MG tablet; Take 1 tablet (50 mg total) by mouth daily.  Dispense: 30 tablet; Refill: 2 - CBC with Differential/Platelet - CMP14+EGFR - Lipid panel (not fasting) - TSH  2. Stage 3 chronic kidney disease, unspecified whether stage 3a or 3b CKD - CMP14+EGFR  3. Hyperlipidemia, unspecified hyperlipidemia type - Patient to restart atorvastatin as this was not the cause of her itching.  - atorvastatin (LIPITOR) 80 MG tablet; Take 1 tablet (80 mg total) by mouth daily at 6 PM.  Dispense: 90 tablet; Refill: 1 - CMP14+EGFR - Lipid panel (not fasting)  4. History of CVA (cerebrovascular accident) - Restarting atorvastatin. Continue Plavix.  - clopidogrel (PLAVIX) 75 MG tablet; Take 75 mg by mouth daily. - atorvastatin (LIPITOR) 80 MG tablet; Take 1 tablet (80 mg total) by mouth daily at 6 PM.  Dispense: 90 tablet; Refill: 1 - Lipid panel (not fasting)  5. Intertriginous dermatitis associated with moisture - nystatin (MYCOSTATIN/NYSTOP) powder; Apply 1 application topically 3 (three) times daily.  Dispense: 60 g; Refill: 0   Follow-up: Return in about 2 weeks (around 02/19/2020) for HTN.   Hendricks Limes, MSN, APRN, FNP-C [facility]  Subjective:  Patient ID: Valerie Coleman, female    DOB: 1961-02-24  Age: 59 y.o. MRN: [id number]  Patient Care Team: Loman Brooklyn, FNP as PCP - General (Family Medicine)  CC:  Chief Complaint  Patient presents with  . New Patient (Initial Visit)    [facility]  . Establish Care  . Pruritis    Patient states she has  been itching in her groin area in the cracks of her legs after getting of a cholestoral medication.    HPI Valerie Coleman presents to establish care.   Patient is accompanied by a friend, who she is okay with being present.   Patient reports she use to be on cholesterol medication but stopped it months ago as she felt it was causing her to itch. The itching did NOT stop after stopping the medication.   The itching occurs in her groin folds bilaterally.    Review of Systems  Constitutional: Negative for chills, fever, malaise/fatigue and weight loss.  HENT: Negative for congestion, ear discharge, ear pain, nosebleeds, sinus pain, sore throat and tinnitus.   Eyes: Negative for blurred vision, double vision, pain, discharge and redness.  Respiratory: Negative for cough, shortness of breath and wheezing.   Cardiovascular: Negative for chest pain, palpitations and leg swelling.  Gastrointestinal: Negative for abdominal pain, constipation, diarrhea, heartburn, nausea and vomiting.  Genitourinary: Negative for dysuria, frequency and urgency.  Musculoskeletal: Negative for myalgias.  Skin: Positive for itching. Negative for rash.  Neurological: Negative for dizziness, seizures, weakness and headaches.  Psychiatric/Behavioral: Negative for depression, substance abuse and suicidal ideas. The patient is not nervous/anxious.     Current Outpatient Medications:  .  clopidogrel (PLAVIX) 75 MG tablet, Take 75 mg by mouth daily., Disp: , Rfl:  .  hydrALAZINE (APRESOLINE) 10 MG tablet, Take 10 mg by mouth daily. , Disp: , Rfl:  .  losartan (COZAAR)  25 MG tablet, Take 25 mg by mouth daily., Disp: , Rfl:  .  amLODipine (NORVASC) 10 MG tablet, TAKE ONE (1) TABLET EACH DAY, Disp: 60 tablet, Rfl: 3  No Known Allergies  Past Medical History:  Diagnosis Date  . Bilateral lower extremity edema   . Blind   . CKD (chronic kidney disease)   . CVA (cerebral vascular accident) ([place]) 05/2017  . Hypertension      Past Surgical History:  Procedure Laterality Date  . CHOLECYSTECTOMY    . TUBAL LIGATION      Family History  Problem Relation Age of Onset  . Diabetes Mother   . Hypertension Mother   . Hypertension Father   . Hypertension Sister   . Cerebral palsy Sister   . Hypertension Daughter   . Cancer Paternal Uncle   . Lung cancer Brother   . Brain cancer Brother     Social History   Socioeconomic History  . Marital status: Single    Spouse name: Not on file  . Number of children: Not on file  . Years of education: Not on file  . Highest education level: Not on file  Occupational History  . Not on file  Tobacco Use  . Smoking status: Former Smoker    Types: Cigarettes  . Smokeless tobacco: Never Used  Vaping Use  . Vaping Use: Never used  Substance and Sexual Activity  . Alcohol use: Yes    Comment: occ  . Drug use: No  . Sexual activity: Not on file  Other Topics Concern  . Not on file  Social History Narrative  . Not on file   Social Determinants of Health   Financial Resource Strain:   . Difficulty of Paying Living Expenses: Not on file  Food Insecurity:   . Worried About Charity fundraiser in the Last Year: Not on file  . Ran Out of Food in the Last Year: Not on file  Transportation Needs:   . Lack of Transportation (Medical): Not on file  . Lack of Transportation (Non-Medical): Not on file  Physical Activity:   . Days of Exercise per Week: Not on file  . Minutes of Exercise per Session: Not on file  Stress:   . Feeling of Stress : Not on file  Social Connections:   . Frequency of Communication with Friends and Family: Not on file  . Frequency of Social Gatherings with Friends and Family: Not on file  . Attends Religious Services: Not on file  . Active Member of Clubs or Organizations: Not on file  . Attends Archivist Meetings: Not on file  . Marital Status: Not on file  Intimate Partner Violence:   . Fear of Current or Ex-Partner:  Not on file  . Emotionally Abused: Not on file  . Physically Abused: Not on file  . Sexually Abused: Not on file    Objective:   Today's Vitals: BP (!) 204/110   Pulse 76   Temp (!) 97 F (36.1 C) (Temporal)   Ht 6' (1.829 m)   Wt 203 lb (92.1 kg)   SpO2 99%   BMI 27.53 kg/m   Physical Exam Vitals reviewed.  Constitutional:      General: She is not in acute distress.    Appearance: Normal appearance. She is overweight. She is not ill-appearing, toxic-appearing or diaphoretic.  HENT:     Head: Normocephalic and atraumatic.  Eyes:     General: No scleral icterus.  Right eye: No discharge.        Left eye: No discharge.     Conjunctiva/sclera: Conjunctivae normal.  Cardiovascular:     Rate and Rhythm: Normal rate and regular rhythm.     Heart sounds: Normal heart sounds. No murmur heard.  No friction rub. No gallop.   Pulmonary:     Effort: Pulmonary effort is normal. No respiratory distress.     Breath sounds: Normal breath sounds. No stridor. No wheezing, rhonchi or rales.  Musculoskeletal:        General: Normal range of motion.     Cervical back: Normal range of motion.  Skin:    General: Skin is warm and dry.     Capillary Refill: Capillary refill takes less than 2 seconds.     Comments: Candidal intertrigo to bilateral groin folds.   Neurological:     General: No focal deficit present.     Mental Status: She is alert and oriented to person, place, and time. Mental status is at baseline.  Psychiatric:        Mood and Affect: Mood normal.        Behavior: Behavior normal.        Thought Content: Thought content normal.        Judgment: Judgment normal.

## 2020-02-05 NOTE — Patient Instructions (Addendum)
Increase Losartan from 25 mg to 50 mg once daily. You may take two tablets of the 25 mg to use up the bottle. When you get your new bottle, it will be 50 mg tablets, so you only need to take 1.  Monitor your blood pressure daily or every other day. Keep a log and bring it back with you.   Restart atorvastatin (cholesteorl medication).   DASH Eating Plan DASH stands for "Dietary Approaches to Stop Hypertension." The DASH eating plan is a healthy eating plan that has been shown to reduce high blood pressure (hypertension). It may also reduce your risk for type 2 diabetes, heart disease, and stroke. The DASH eating plan may also help with weight loss. What are tips for following this plan?  General guidelines  Avoid eating more than 2,300 mg (milligrams) of salt (sodium) a day. If you have hypertension, you may need to reduce your sodium intake to 1,500 mg a day.  Limit alcohol intake to no more than 1 drink a day for nonpregnant women and 2 drinks a day for men. One drink equals 12 oz of beer, 5 oz of wine, or 1 oz of hard liquor.  Work with your health care provider to maintain a healthy body weight or to lose weight. Ask what an ideal weight is for you.  Get at least 30 minutes of exercise that causes your heart to beat faster (aerobic exercise) most days of the week. Activities may include walking, swimming, or biking.  Work with your health care provider or diet and nutrition specialist (dietitian) to adjust your eating plan to your individual calorie needs. Reading food labels   Check food labels for the amount of sodium per serving. Choose foods with less than 5 percent of the Daily Value of sodium. Generally, foods with less than 300 mg of sodium per serving fit into this eating plan.  To find whole grains, look for the word "whole" as the first word in the ingredient list. Shopping  Buy products labeled as "low-sodium" or "no salt added."  Buy fresh foods. Avoid canned foods  and premade or frozen meals. Cooking  Avoid adding salt when cooking. Use salt-free seasonings or herbs instead of table salt or sea salt. Check with your health care provider or pharmacist before using salt substitutes.  Do not fry foods. Cook foods using healthy methods such as baking, boiling, grilling, and broiling instead.  Cook with heart-healthy oils, such as olive, canola, soybean, or sunflower oil. Meal planning  Eat a balanced diet that includes: ? 5 or more servings of fruits and vegetables each day. At each meal, try to fill half of your plate with fruits and vegetables. ? Up to 6-8 servings of whole grains each day. ? Less than 6 oz of lean meat, poultry, or fish each day. A 3-oz serving of meat is about the same size as a deck of cards. One egg equals 1 oz. ? 2 servings of low-fat dairy each day. ? A serving of nuts, seeds, or beans 5 times each week. ? Heart-healthy fats. Healthy fats called Omega-3 fatty acids are found in foods such as flaxseeds and coldwater fish, like sardines, salmon, and mackerel.  Limit how much you eat of the following: ? Canned or prepackaged foods. ? Food that is high in trans fat, such as fried foods. ? Food that is high in saturated fat, such as fatty meat. ? Sweets, desserts, sugary drinks, and other foods with added sugar. ? Full-fat  dairy products.  Do not salt foods before eating.  Try to eat at least 2 vegetarian meals each week.  Eat more home-cooked food and less restaurant, buffet, and fast food.  When eating at a restaurant, ask that your food be prepared with less salt or no salt, if possible. What foods are recommended? The items listed may not be a complete list. Talk with your dietitian about what dietary choices are best for you. Grains Whole-grain or whole-wheat bread. Whole-grain or whole-wheat pasta. Brown rice. [place]. Bulgur. Whole-grain and low-sodium cereals. Pita bread. Low-fat, low-sodium crackers.  Whole-wheat flour tortillas. Vegetables Fresh or frozen vegetables (raw, steamed, roasted, or grilled). Low-sodium or reduced-sodium tomato and vegetable juice. Low-sodium or reduced-sodium tomato sauce and tomato paste. Low-sodium or reduced-sodium canned vegetables. Fruits All fresh, dried, or frozen fruit. Canned fruit in natural juice (without added sugar). Meat and other protein foods Skinless chicken or [place]. Ground chicken or [place]. Pork with fat trimmed off. Fish and seafood. Egg whites. Dried beans, peas, or lentils. Unsalted nuts, nut butters, and seeds. Unsalted canned beans. Lean cuts of beef with fat trimmed off. Low-sodium, lean deli meat. Dairy Low-fat (1%) or fat-free (skim) milk. Fat-free, low-fat, or reduced-fat cheeses. Nonfat, low-sodium ricotta or cottage cheese. Low-fat or nonfat yogurt. Low-fat, low-sodium cheese. Fats and oils Soft margarine without trans fats. Vegetable oil. Low-fat, reduced-fat, or light mayonnaise and salad dressings (reduced-sodium). Canola, safflower, olive, soybean, and sunflower oils. Avocado. Seasoning and other foods Herbs. Spices. Seasoning mixes without salt. Unsalted popcorn and pretzels. Fat-free sweets. What foods are not recommended? The items listed may not be a complete list. Talk with your dietitian about what dietary choices are best for you. Grains Baked goods made with fat, such as croissants, muffins, or some breads. Dry pasta or rice meal packs. Vegetables Creamed or fried vegetables. Vegetables in a cheese sauce. Regular canned vegetables (not low-sodium or reduced-sodium). Regular canned tomato sauce and paste (not low-sodium or reduced-sodium). Regular tomato and vegetable juice (not low-sodium or reduced-sodium). Angie Fava. Olives. Fruits Canned fruit in a light or heavy syrup. Fried fruit. Fruit in cream or butter sauce. Meat and other protein foods Fatty cuts of meat. Ribs. Fried meat. Berniece Salines. Sausage. Bologna and other  processed lunch meats. Salami. Fatback. Hotdogs. Bratwurst. Salted nuts and seeds. Canned beans with added salt. Canned or smoked fish. Whole eggs or egg yolks. Chicken or [place] with skin. Dairy Whole or 2% milk, cream, and half-and-half. Whole or full-fat cream cheese. Whole-fat or sweetened yogurt. Full-fat cheese. Nondairy creamers. Whipped toppings. Processed cheese and cheese spreads. Fats and oils Butter. Stick margarine. Lard. Shortening. Ghee. Bacon fat. Tropical oils, such as coconut, palm kernel, or palm oil. Seasoning and other foods Salted popcorn and pretzels. Onion salt, garlic salt, seasoned salt, table salt, and sea salt. Worcestershire sauce. Tartar sauce. Barbecue sauce. Teriyaki sauce. Soy sauce, including reduced-sodium. Steak sauce. Canned and packaged gravies. Fish sauce. Oyster sauce. Cocktail sauce. Horseradish that you find on the shelf. Ketchup. Mustard. Meat flavorings and tenderizers. Bouillon cubes. Hot sauce and Tabasco sauce. Premade or packaged marinades. Premade or packaged taco seasonings. Relishes. Regular salad dressings. Where to find more information:  National Heart, Lung, and [place]: https://wilson-eaton.com/  American Heart Association: www.heart.org Summary  The DASH eating plan is a healthy eating plan that has been shown to reduce high blood pressure (hypertension). It may also reduce your risk for type 2 diabetes, heart disease, and stroke.  With the DASH eating plan, you should  limit salt (sodium) intake to 2,300 mg a day. If you have hypertension, you may need to reduce your sodium intake to 1,500 mg a day.  When on the DASH eating plan, aim to eat more fresh fruits and vegetables, whole grains, lean proteins, low-fat dairy, and heart-healthy fats.  Work with your health care provider or diet and nutrition specialist (dietitian) to adjust your eating plan to your individual calorie needs. This information is not intended to replace advice given to  you by your health care provider. Make sure you discuss any questions you have with your health care provider. Document Revised: 05/11/2017 Document Reviewed: 05/22/2016 Elsevier Patient Education  2020 [employer].

## 2020-02-06 ENCOUNTER — Other Ambulatory Visit: Payer: Self-pay | Admitting: Family Medicine

## 2020-02-06 DIAGNOSIS — N1832 Chronic kidney disease, stage 3b: Secondary | ICD-10-CM

## 2020-02-06 LAB — TSH: TSH: 2 u[IU]/mL (ref 0.450–4.500)

## 2020-02-06 LAB — CBC WITH DIFFERENTIAL/PLATELET
Basophils Absolute: 0 10*3/uL (ref 0.0–0.2)
Basos: 0 %
EOS (ABSOLUTE): 0.2 10*3/uL (ref 0.0–0.4)
Eos: 2 %
Hematocrit: 45.2 % (ref 34.0–46.6)
Hemoglobin: 14.3 g/dL (ref 11.1–15.9)
Immature Grans (Abs): 0 10*3/uL (ref 0.0–0.1)
Immature Granulocytes: 0 %
Lymphocytes Absolute: 2.2 10*3/uL (ref 0.7–3.1)
Lymphs: 28 %
MCH: 26.4 pg — ABNORMAL LOW (ref 26.6–33.0)
MCHC: 31.6 g/dL (ref 31.5–35.7)
MCV: 84 fL (ref 79–97)
Monocytes Absolute: 0.6 10*3/uL (ref 0.1–0.9)
Monocytes: 7 %
Neutrophils Absolute: 5.1 10*3/uL (ref 1.4–7.0)
Neutrophils: 63 %
Platelets: 305 10*3/uL (ref 150–450)
RBC: 5.41 x10E6/uL — ABNORMAL HIGH (ref 3.77–5.28)
RDW: 15.5 % — ABNORMAL HIGH (ref 11.7–15.4)
WBC: 8.1 10*3/uL (ref 3.4–10.8)

## 2020-02-06 LAB — CMP14+EGFR
ALT: 18 IU/L (ref 0–32)
AST: 17 IU/L (ref 0–40)
Albumin/Globulin Ratio: 1.6 (ref 1.2–2.2)
Albumin: 4.6 g/dL (ref 3.8–4.8)
Alkaline Phosphatase: 90 IU/L (ref 48–121)
BUN/Creatinine Ratio: 8 — ABNORMAL LOW (ref 12–28)
BUN: 13 mg/dL (ref 8–27)
Bilirubin Total: 0.2 mg/dL (ref 0.0–1.2)
CO2: 22 mmol/L (ref 20–29)
Calcium: 9.9 mg/dL (ref 8.7–10.3)
Chloride: 103 mmol/L (ref 96–106)
Creatinine, Ser: 1.56 mg/dL — ABNORMAL HIGH (ref 0.57–1.00)
GFR calc Af Amer: 41 mL/min/{1.73_m2} — ABNORMAL LOW (ref >59)
GFR calc non Af Amer: 36 mL/min/{1.73_m2} — ABNORMAL LOW (ref >59)
Globulin, Total: 2.9 g/dL (ref 1.5–4.5)
Glucose: 86 mg/dL (ref 65–99)
Potassium: 5 mmol/L (ref 3.5–5.2)
Sodium: 137 mmol/L (ref 134–144)
Total Protein: 7.5 g/dL (ref 6.0–8.5)

## 2020-02-06 LAB — LIPID PANEL
Chol/HDL Ratio: 11.5 ratio — ABNORMAL HIGH (ref 0.0–4.4)
Cholesterol, Total: 436 mg/dL — ABNORMAL HIGH (ref 100–199)
HDL: 38 mg/dL — ABNORMAL LOW (ref >39)
LDL Chol Calc (NIH): 277 mg/dL — ABNORMAL HIGH (ref 0–99)
Triglycerides: 495 mg/dL — ABNORMAL HIGH (ref 0–149)
VLDL Cholesterol Cal: 121 mg/dL — ABNORMAL HIGH (ref 5–40)

## 2020-02-06 MED ORDER — DAPAGLIFLOZIN PROPANEDIOL 5 MG PO TABS: 5.0000 mg | ORAL_TABLET | Freq: Every day | ORAL | 2 refills | Status: DC

## 2020-02-07 ENCOUNTER — Encounter: Payer: Self-pay | Admitting: Family Medicine

## 2020-02-10 ENCOUNTER — Telehealth: Payer: Self-pay | Admitting: Family Medicine

## 2020-02-10 NOTE — Telephone Encounter (Signed)
I spoke to the pt and she states she has had the rash in the folds of her skin which Britney gave her rx for powder at the last visit. She had 1 episode of loose stool this morning and felt a little dizzy but has not had any further loose stools or complaints of dizziness. Pt hasn't started the farxiga yet so all the medications she is taking she has been taking. Advised pt to monitor and to call back with ongoing symptoms or worsening symptoms. Pt voiced understanding.

## 2020-02-17 ENCOUNTER — Encounter: Payer: Self-pay | Admitting: Family Medicine

## 2020-02-23 ENCOUNTER — Other Ambulatory Visit: Payer: Self-pay | Admitting: Family Medicine

## 2020-02-23 DIAGNOSIS — L304 Erythema intertrigo: Secondary | ICD-10-CM

## 2020-02-26 ENCOUNTER — Ambulatory Visit: Payer: MEDICAID | Admitting: Family Medicine

## 2020-03-11 ENCOUNTER — Ambulatory Visit: Payer: MEDICAID | Admitting: Family Medicine

## 2020-03-22 ENCOUNTER — Telehealth: Payer: Self-pay

## 2020-03-22 DIAGNOSIS — L304 Erythema intertrigo: Secondary | ICD-10-CM

## 2020-03-22 DIAGNOSIS — N950 Postmenopausal bleeding: Secondary | ICD-10-CM

## 2020-03-22 NOTE — Telephone Encounter (Signed)
Patient states that she stopped having her menstrual when she was 57 and she did not have a cycle x 4 years.  She stated that the last year she started having a cycle every 4-5 months.  Patient states that she is also having her itching and would like a refill on Nystatin powder. Please advise

## 2020-03-22 NOTE — Telephone Encounter (Signed)
  Prescription Request  03/22/2020  What is the name of the medication or equipment? Itching powder  Have you contacted your pharmacy to request a refill? (if applicable) no  Which pharmacy would you like this sent to[facility]   Patient notified that their request is being sent to the clinical staff for review and that they should receive a response within 2 business days.

## 2020-03-23 ENCOUNTER — Ambulatory Visit: Payer: MEDICAID | Admitting: Nurse Practitioner

## 2020-03-23 MED ORDER — NYSTATIN 100000 UNIT/GM EX POWD: Freq: Three times a day (TID) | CUTANEOUS | 1 refills | Status: DC

## 2020-03-23 NOTE — Telephone Encounter (Signed)
Referral placed.

## 2020-03-23 NOTE — Telephone Encounter (Signed)
Patient states she would prefer to go to [place].

## 2020-03-23 NOTE — Telephone Encounter (Signed)
I will refill the nystatin powder but she needs to see an OB/GYN due to postmenopausal bleeding.  Does she have a preference where she would like to go?

## 2020-03-26 ENCOUNTER — Ambulatory Visit: Payer: MEDICAID | Admitting: Family Medicine

## 2020-03-26 ENCOUNTER — Ambulatory Visit: Payer: MEDICAID | Admitting: Nurse Practitioner

## 2020-03-31 ENCOUNTER — Other Ambulatory Visit: Payer: Self-pay | Admitting: Family Medicine

## 2020-03-31 DIAGNOSIS — I10 Essential (primary) hypertension: Secondary | ICD-10-CM

## 2020-03-31 LAB — HM PAP SMEAR: HM Pap smear: ABNORMAL

## 2020-04-12 DIAGNOSIS — D259 Leiomyoma of uterus, unspecified: Secondary | ICD-10-CM | POA: Insufficient documentation

## 2020-04-12 DIAGNOSIS — N83201 Unspecified ovarian cyst, right side: Secondary | ICD-10-CM | POA: Insufficient documentation

## 2020-04-12 DIAGNOSIS — R87619 Unspecified abnormal cytological findings in specimens from cervix uteri: Secondary | ICD-10-CM | POA: Insufficient documentation

## 2020-04-17 ENCOUNTER — Other Ambulatory Visit: Payer: Self-pay | Admitting: Family Medicine

## 2020-04-17 DIAGNOSIS — N1832 Chronic kidney disease, stage 3b: Secondary | ICD-10-CM

## 2020-04-26 DIAGNOSIS — N840 Polyp of corpus uteri: Secondary | ICD-10-CM | POA: Insufficient documentation

## 2020-05-21 ENCOUNTER — Other Ambulatory Visit: Payer: Self-pay | Admitting: Family Medicine

## 2020-05-21 DIAGNOSIS — N1832 Chronic kidney disease, stage 3b: Secondary | ICD-10-CM

## 2020-05-21 DIAGNOSIS — L304 Erythema intertrigo: Secondary | ICD-10-CM

## 2020-06-29 ENCOUNTER — Other Ambulatory Visit: Payer: Self-pay | Admitting: Family Medicine

## 2020-06-29 DIAGNOSIS — I10 Essential (primary) hypertension: Secondary | ICD-10-CM

## 2020-06-29 DIAGNOSIS — N1832 Chronic kidney disease, stage 3b: Secondary | ICD-10-CM

## 2020-06-29 NOTE — Telephone Encounter (Signed)
Aware and appointment made

## 2020-06-29 NOTE — Telephone Encounter (Signed)
I am giving a 30 day supply but patient needs to schedule an office visit ASAP. Please call her to schedule.

## 2020-07-15 ENCOUNTER — Ambulatory Visit: Payer: MEDICAID | Admitting: Family Medicine

## 2020-07-15 ENCOUNTER — Encounter: Payer: Self-pay | Admitting: Family Medicine

## 2020-07-23 ENCOUNTER — Other Ambulatory Visit: Payer: Self-pay | Admitting: Family Medicine

## 2020-07-23 DIAGNOSIS — Z8673 Personal history of transient ischemic attack (TIA), and cerebral infarction without residual deficits: Secondary | ICD-10-CM

## 2020-07-23 DIAGNOSIS — E785 Hyperlipidemia, unspecified: Secondary | ICD-10-CM

## 2020-07-26 ENCOUNTER — Other Ambulatory Visit: Payer: Self-pay | Admitting: Family Medicine

## 2020-07-26 DIAGNOSIS — I10 Essential (primary) hypertension: Secondary | ICD-10-CM

## 2020-08-02 ENCOUNTER — Other Ambulatory Visit: Payer: Self-pay | Admitting: Family Medicine

## 2020-08-02 DIAGNOSIS — N1832 Chronic kidney disease, stage 3b: Secondary | ICD-10-CM

## 2020-08-16 ENCOUNTER — Encounter: Payer: Self-pay | Admitting: Family Medicine

## 2020-08-28 ENCOUNTER — Other Ambulatory Visit: Payer: Self-pay | Admitting: Family Medicine

## 2020-08-28 DIAGNOSIS — Z8673 Personal history of transient ischemic attack (TIA), and cerebral infarction without residual deficits: Secondary | ICD-10-CM

## 2020-08-28 DIAGNOSIS — E785 Hyperlipidemia, unspecified: Secondary | ICD-10-CM

## 2020-08-28 DIAGNOSIS — I10 Essential (primary) hypertension: Secondary | ICD-10-CM

## 2020-08-30 NOTE — Telephone Encounter (Signed)
Britney. NTBS 30 days given 07/26/20

## 2020-09-08 ENCOUNTER — Ambulatory Visit: Payer: MEDICAID | Admitting: Family Medicine

## 2020-09-27 ENCOUNTER — Other Ambulatory Visit: Payer: Self-pay | Admitting: Family Medicine

## 2020-09-27 DIAGNOSIS — I10 Essential (primary) hypertension: Secondary | ICD-10-CM

## 2020-09-29 ENCOUNTER — Ambulatory Visit (INDEPENDENT_AMBULATORY_CARE_PROVIDER_SITE_OTHER): Payer: Medicare Other | Admitting: Family Medicine

## 2020-09-29 ENCOUNTER — Other Ambulatory Visit: Payer: Self-pay

## 2020-09-29 ENCOUNTER — Encounter: Payer: Self-pay | Admitting: Family Medicine

## 2020-09-29 VITALS — BP 217/110 | HR 78 | Temp 96.9°F | Ht 73.0 in | Wt 191.0 lb

## 2020-09-29 DIAGNOSIS — Z8673 Personal history of transient ischemic attack (TIA), and cerebral infarction without residual deficits: Secondary | ICD-10-CM | POA: Diagnosis not present

## 2020-09-29 DIAGNOSIS — I10 Essential (primary) hypertension: Secondary | ICD-10-CM | POA: Diagnosis not present

## 2020-09-29 DIAGNOSIS — L304 Erythema intertrigo: Secondary | ICD-10-CM

## 2020-09-29 DIAGNOSIS — E785 Hyperlipidemia, unspecified: Secondary | ICD-10-CM | POA: Diagnosis not present

## 2020-09-29 DIAGNOSIS — N1832 Chronic kidney disease, stage 3b: Secondary | ICD-10-CM

## 2020-09-29 MED ORDER — ATORVASTATIN CALCIUM 80 MG PO TABS: 80.0000 mg | ORAL_TABLET | Freq: Every day | ORAL | 1 refills | Status: DC

## 2020-09-29 MED ORDER — DAPAGLIFLOZIN PROPANEDIOL 5 MG PO TABS: 5.0000 mg | ORAL_TABLET | Freq: Every day | ORAL | 2 refills | Status: DC

## 2020-09-29 MED ORDER — NYSTATIN 100000 UNIT/GM EX POWD: Freq: Three times a day (TID) | CUTANEOUS | 1 refills | Status: DC

## 2020-09-29 NOTE — Progress Notes (Signed)
Assessment & Plan:  1. Essential hypertension EMS called to transfer patient to the ED due to her extremely elevated blood pressure.  She has had 2 strokes in the past.  At our last visit she was advised to follow-up for her blood pressure in 2 weeks and she waited 8 months, so I am uncomfortable managing her as an outpatient.  Discussed with her that she needs to get this controlled quickly to prevent another stroke.  2. Hyperlipidemia, unspecified hyperlipidemia type We will do labs next visit since she is going to the ER. - atorvastatin (LIPITOR) 80 MG tablet; Take 1 tablet (80 mg total) by mouth daily at 6 PM.  Dispense: 90 tablet; Refill: 1  3. History of CVA (cerebrovascular accident) Continue statin and Plavix. - atorvastatin (LIPITOR) 80 MG tablet; Take 1 tablet (80 mg total) by mouth daily at 6 PM.  Dispense: 90 tablet; Refill: 1  4. Intertriginous dermatitis associated with moisture Well controlled on current regimen.  - nystatin (NYSTATIN) powder; Apply topically 3 (three) times daily.  Dispense: 60 g; Refill: 1  5. Stage 3b chronic kidney disease ([place]) Restarting Farxiga. - dapagliflozin propanediol (FARXIGA) 5 MG TABS tablet; Take 1 tablet (5 mg total) by mouth daily.  Dispense: 30 tablet; Refill: 2   Return after hospital visit.  Hendricks Limes, MSN, APRN, FNP-C [facility]  Subjective:    Patient ID: Valerie Coleman, female    DOB: [birth year], 60 y.o.   MRN: [id number]  Patient Care Team: Loman Brooklyn, FNP as PCP - General (Family Medicine)   Chief Complaint:  Chief Complaint  Patient presents with  . Hyperlipidemia  . Hypertension    Check up of chronic medical conditions     HPI: Valerie Coleman is a 60 y.o. female presenting on 09/29/2020 for Hyperlipidemia and Hypertension (Check up of chronic medical conditions )  Patient is accompanied by her neighbor, who she is okay with being present.   CKD: Patient has not been taking her  [place] as she reports she ran out and did not get a refill.  Hypertension: Patient does not check her blood pressure at home.  She has been taking hydralazine 10 mg 3 times daily but is completely out of her losartan 50 mg once daily.  Our last visit was approximately 8 months ago at which time her losartan was increased from 25 mg to 50 mg once daily with directions to follow-up in 2 weeks.  She has not followed up until now.  New complaints: None  Social history:  Relevant past medical, surgical, family and social history reviewed and updated as indicated. Interim medical history since our last visit reviewed.  Allergies and medications reviewed and updated.  DATA REVIEWED: CHART IN EPIC  ROS: Negative unless specifically indicated above in HPI.    Current Outpatient Medications:  .  atorvastatin (LIPITOR) 80 MG tablet, Take 1 tablet (80 mg total) by mouth daily at 6 PM. (Needs to be seen before next refill), Disp: 30 tablet, Rfl: 0 .  clopidogrel (PLAVIX) 75 MG tablet, Take 75 mg by mouth daily., Disp: , Rfl:  .  hydrALAZINE (APRESOLINE) 10 MG tablet, Take 10 mg by mouth daily. , Disp: , Rfl:  .  losartan (COZAAR) 50 MG tablet, Take 1 tablet (50 mg total) by mouth daily. (Needs to be seen before next refill), Disp: 30 tablet, Rfl: 0 .  nystatin (NYSTATIN) powder, Apply topically 3 (three) times daily., Disp: 60 g, Rfl: 1  No Known Allergies Past Medical History:  Diagnosis Date  . Bilateral lower extremity edema   . Blind   . CKD (chronic kidney disease)   . CVA (cerebral vascular accident) ([place]) 05/2017  . Hypertension   . Vitamin D deficiency     Past Surgical History:  Procedure Laterality Date  . CHOLECYSTECTOMY    . EYE SURGERY    . TUBAL LIGATION      Social History   Socioeconomic History  . Marital status: Single    Spouse name: Not on file  . Number of children: Not on file  . Years of education: Not on file  . Highest education level: Not on file   Occupational History  . Not on file  Tobacco Use  . Smoking status: Former Smoker    Types: Cigarettes  . Smokeless tobacco: Never Used  Vaping Use  . Vaping Use: Never used  Substance and Sexual Activity  . Alcohol use: Yes    Comment: occ  . Drug use: No  . Sexual activity: Not on file  Other Topics Concern  . Not on file  Social History Narrative  . Not on file   Social Determinants of Health   Financial Resource Strain: Not on file  Food Insecurity: Not on file  Transportation Needs: Not on file  Physical Activity: Not on file  Stress: Not on file  Social Connections: Not on file  Intimate Partner Violence: Not on file        Objective:    BP (!) 217/110   Pulse 78   Temp (!) 96.9 F (36.1 C) (Temporal)   Ht 6\' 1"  (1.854 m)   Wt 191 lb (86.6 kg)   SpO2 99%   BMI 25.20 kg/m   Wt Readings from Last 3 Encounters:  09/29/20 191 lb (86.6 kg)  02/05/20 203 lb (92.1 kg)  06/13/17 204 lb (92.5 kg)    Physical Exam Vitals reviewed.  Constitutional:      General: She is not in acute distress.    Appearance: Normal appearance. She is normal weight. She is not ill-appearing, toxic-appearing or diaphoretic.  HENT:     Head: Normocephalic and atraumatic.  Eyes:     General: No scleral icterus.       Right eye: No discharge.        Left eye: No discharge.     Conjunctiva/sclera: Conjunctivae normal.  Cardiovascular:     Rate and Rhythm: Normal rate and regular rhythm.     Heart sounds: Normal heart sounds. No murmur heard. No friction rub. No gallop.   Pulmonary:     Effort: Pulmonary effort is normal. No respiratory distress.     Breath sounds: Normal breath sounds. No stridor. No wheezing, rhonchi or rales.  Musculoskeletal:        General: Normal range of motion.     Cervical back: Normal range of motion.  Skin:    General: Skin is warm and dry.     Capillary Refill: Capillary refill takes less than 2 seconds.  Neurological:     General: No focal  deficit present.     Mental Status: She is alert and oriented to person, place, and time. Mental status is at baseline.  Psychiatric:        Mood and Affect: Mood normal.        Behavior: Behavior normal.        Thought Content: Thought content normal.        Judgment:  Judgment normal.     Lab Results  Component Value Date   TSH 2.000 02/05/2020   Lab Results  Component Value Date   WBC 8.1 02/05/2020   HGB 14.3 02/05/2020   HCT 45.2 02/05/2020   MCV 84 02/05/2020   PLT 305 02/05/2020   Lab Results  Component Value Date   NA 137 02/05/2020   K 5.0 02/05/2020   CO2 22 02/05/2020   GLUCOSE 86 02/05/2020   BUN 13 02/05/2020   CREATININE 1.56 (H) 02/05/2020   BILITOT 0.2 02/05/2020   ALKPHOS 90 02/05/2020   AST 17 02/05/2020   ALT 18 02/05/2020   PROT 7.5 02/05/2020   ALBUMIN 4.6 02/05/2020   CALCIUM 9.9 02/05/2020   ANIONGAP 9 05/19/2017   Lab Results  Component Value Date   CHOL 436 (H) 02/05/2020   Lab Results  Component Value Date   HDL 38 (L) 02/05/2020   Lab Results  Component Value Date   LDLCALC 277 (H) 02/05/2020   Lab Results  Component Value Date   TRIG 495 (H) 02/05/2020   Lab Results  Component Value Date   CHOLHDL 11.5 (H) 02/05/2020   Lab Results  Component Value Date   HGBA1C 5.3 05/17/2017

## 2020-10-05 ENCOUNTER — Other Ambulatory Visit: Payer: Self-pay

## 2020-10-05 ENCOUNTER — Emergency Department (HOSPITAL_COMMUNITY)
Admission: EM | Admit: 2020-10-05 | Discharge: 2020-10-05 | Disposition: A | Discharge status: Home / Self Care | Payer: Medicare Other | Attending: Emergency Medicine | Admitting: Emergency Medicine

## 2020-10-05 ENCOUNTER — Encounter (HOSPITAL_COMMUNITY): Payer: Self-pay

## 2020-10-05 DIAGNOSIS — Z79899 Other long term (current) drug therapy: Secondary | ICD-10-CM | POA: Diagnosis not present

## 2020-10-05 DIAGNOSIS — I10 Essential (primary) hypertension: Secondary | ICD-10-CM

## 2020-10-05 DIAGNOSIS — Z7902 Long term (current) use of antithrombotics/antiplatelets: Secondary | ICD-10-CM | POA: Diagnosis not present

## 2020-10-05 DIAGNOSIS — N183 Chronic kidney disease, stage 3 unspecified: Secondary | ICD-10-CM | POA: Diagnosis not present

## 2020-10-05 DIAGNOSIS — I129 Hypertensive chronic kidney disease with stage 1 through stage 4 chronic kidney disease, or unspecified chronic kidney disease: Secondary | ICD-10-CM | POA: Diagnosis not present

## 2020-10-05 DIAGNOSIS — Z87891 Personal history of nicotine dependence: Secondary | ICD-10-CM | POA: Insufficient documentation

## 2020-10-05 LAB — CBC WITH DIFFERENTIAL/PLATELET
Abs Immature Granulocytes: 0.01 10*3/uL (ref 0.00–0.07)
Basophils Absolute: 0 10*3/uL (ref 0.0–0.1)
Basophils Relative: 0 %
Eosinophils Absolute: 0.1 10*3/uL (ref 0.0–0.5)
Eosinophils Relative: 2 %
HCT: 43.2 % (ref 36.0–46.0)
Hemoglobin: 13.9 g/dL (ref 12.0–15.0)
Immature Granulocytes: 0 %
Lymphocytes Relative: 22 %
Lymphs Abs: 1.7 10*3/uL (ref 0.7–4.0)
MCH: 29.3 pg (ref 26.0–34.0)
MCHC: 32.2 g/dL (ref 30.0–36.0)
MCV: 90.9 fL (ref 80.0–100.0)
Monocytes Absolute: 0.4 10*3/uL (ref 0.1–1.0)
Monocytes Relative: 6 %
Neutro Abs: 5.2 10*3/uL (ref 1.7–7.7)
Neutrophils Relative %: 70 %
Platelets: 307 10*3/uL (ref 150–400)
RBC: 4.75 MIL/uL (ref 3.87–5.11)
RDW: 14.3 % (ref 11.5–15.5)
WBC: 7.4 10*3/uL (ref 4.0–10.5)
nRBC: 0 % (ref 0.0–0.2)

## 2020-10-05 LAB — BASIC METABOLIC PANEL
Anion gap: 9 (ref 5–15)
BUN: 13 mg/dL (ref 8–23)
CO2: 23 mmol/L (ref 22–32)
Calcium: 9.4 mg/dL (ref 8.9–10.3)
Chloride: 107 mmol/L (ref 98–111)
Creatinine, Ser: 1.63 mg/dL — ABNORMAL HIGH (ref 0.44–1.00)
GFR, Estimated: 35 mL/min — ABNORMAL LOW (ref >60)
Glucose, Bld: 84 mg/dL (ref 70–99)
Potassium: 4.8 mmol/L (ref 3.5–5.1)
Sodium: 139 mmol/L (ref 135–145)

## 2020-10-05 LAB — URINALYSIS, ROUTINE W REFLEX MICROSCOPIC
Bacteria, UA: NONE SEEN
Bilirubin Urine: NEGATIVE
Glucose, UA: >=500 mg/dL — AB
Ketones, ur: NEGATIVE mg/dL
Leukocytes,Ua: NEGATIVE
Nitrite: NEGATIVE
Protein, ur: 30 mg/dL — AB
Specific Gravity, Urine: 1.008 (ref 1.005–1.030)
pH: 7 (ref 5.0–8.0)

## 2020-10-05 MED ORDER — LOSARTAN POTASSIUM 50 MG PO TABS: 50.0000 mg | ORAL_TABLET | Freq: Every day | ORAL | 0 refills | Status: DC

## 2020-10-05 MED ORDER — LOSARTAN POTASSIUM 25 MG PO TABS
50.0000 mg | ORAL_TABLET | Freq: Once | ORAL | Status: AC
  Administered 2020-10-05: 50 mg via ORAL
  Filled 2020-10-05: qty 2

## 2020-10-05 NOTE — ED Notes (Signed)
Patient requesting food at this time

## 2020-10-05 NOTE — ED Notes (Signed)
Patient's daughter updated on plan of care and hx of care today in the ED.

## 2020-10-05 NOTE — ED Notes (Signed)
EMS refusing to take patient at this time.

## 2020-10-05 NOTE — ED Triage Notes (Signed)
Patient to ED via EMS with complaints of hypertension this am. EMS reports BP of 220/110. Patient complaining of feeling lightheaded. Recently admitted for HTN and states that MD did not change BP medication upon discharge.

## 2020-10-05 NOTE — ED Notes (Signed)
Patient's daughter called at this time for transport home. Daughter states she does not have a ride to come get patient and the patient is blind. EMS called for transport.

## 2020-10-05 NOTE — ED Provider Notes (Signed)
[facility] EMERGENCY DEPARTMENT Provider Note   CSN: [id number] Arrival date & time: 10/05/20  1037     History Chief Complaint  Patient presents with  . Hypertension    Valerie Coleman is a 60 y.o. female.  Pt presents to the ED today with elevated bp.  Pt has a hx of htn and was seen in the ED at [facility] ED on 4/20 for the same.  Pt was seen at her pcp's office on the 20th and was sent to the ED then to get her bp down.  Pt is blind and said her daughter arranges all of her meds.  Pt only takes hydralazine for her bp.  She is supposed to be on losartan (cozaar), but she was not aware of this.  Pt said she was recently started on Dapagliflozin (farxiga) for her kidneys.  She is concerned this is the cause of her elevated bp. She said she feels fine.         Past Medical History:  Diagnosis Date  . Bilateral lower extremity edema   . Blind   . CKD (chronic kidney disease)   . CVA (cerebral vascular accident) ([place]) 05/2017  . Hypertension   . Vitamin D deficiency     Patient Active Problem List   Diagnosis Date Noted  . Intertriginous dermatitis associated with moisture 09/29/2020  . Glaucoma of both eyes   . Blind left eye   . Essential hypertension   . Hyperlipidemia   . History of CVA (cerebrovascular accident) 05/16/2017  . CKD (chronic kidney disease) stage 3, GFR 30-59 ml/min ([facility]) 01/04/2017  . Central retinal vein occlusion     Past Surgical History:  Procedure Laterality Date  . CHOLECYSTECTOMY    . EYE SURGERY    . TUBAL LIGATION       OB History   No obstetric history on file.     Family History  Problem Relation Age of Onset  . Diabetes Mother   . Hypertension Mother   . Hypertension Father   . Hypertension Sister   . Cerebral palsy Sister   . Hypertension Daughter   . Cancer Paternal Uncle   . Lung cancer Brother   . Brain cancer Brother     Social History   Tobacco Use  . Smoking status: Former Smoker    Types: Cigarettes  .  Smokeless tobacco: Never Used  Vaping Use  . Vaping Use: Never used  Substance Use Topics  . Alcohol use: Not Currently    Comment: occ  . Drug use: No    Home Medications Prior to Admission medications   Medication Sig Start Date End Date Taking? Authorizing Provider  atorvastatin (LIPITOR) 80 MG tablet Take 1 tablet (80 mg total) by mouth daily at 6 PM. 09/29/20  Yes Hendricks Limes F, FNP  clopidogrel (PLAVIX) 75 MG tablet Take 75 mg by mouth daily. 01/21/20  Yes [provider]  dapagliflozin propanediol (FARXIGA) 5 MG TABS tablet Take 1 tablet (5 mg total) by mouth daily. 09/29/20  Yes Hendricks Limes F, FNP  hydrALAZINE (APRESOLINE) 10 MG tablet Take 10 mg by mouth 3 (three) times daily.   Yes [provider]  nystatin (NYSTATIN) powder Apply topically 3 (three) times daily. 09/29/20  Yes Loman Brooklyn, FNP  losartan (COZAAR) 50 MG tablet Take 1 tablet (50 mg total) by mouth daily. (Needs to be seen before next refill) 10/05/20   Isla Pence, MD    Allergies    Patient  has no known allergies.  Review of Systems   Review of Systems  All other systems reviewed and are negative.   Physical Exam Updated Vital Signs BP (!) 173/99   Pulse 62   Temp 97.7 F (36.5 C) (Oral)   Resp 15   Ht 6\' 1"  (1.854 m)   Wt 86.6 kg   SpO2 99%   BMI 25.20 kg/m   Physical Exam Vitals and nursing note reviewed.  Constitutional:      Appearance: Normal appearance.  HENT:     Head: Normocephalic and atraumatic.     Right Ear: External ear normal.     Left Ear: External ear normal.     Nose: Nose normal.     Mouth/Throat:     Mouth: Mucous membranes are moist.     Pharynx: Oropharynx is clear.  Eyes:     Comments: blind  Cardiovascular:     Rate and Rhythm: Normal rate and regular rhythm.     Pulses: Normal pulses.     Heart sounds: Normal heart sounds.  Pulmonary:     Effort: Pulmonary effort is normal.     Breath sounds: Normal breath sounds.  Abdominal:      General: Abdomen is flat. Bowel sounds are normal.     Palpations: Abdomen is soft.  Musculoskeletal:        General: Normal range of motion.     Cervical back: Normal range of motion and neck supple.  Skin:    General: Skin is warm.     Capillary Refill: Capillary refill takes less than 2 seconds.  Neurological:     General: No focal deficit present.     Mental Status: She is alert and oriented to person, place, and time.  Psychiatric:        Mood and Affect: Mood normal.        Behavior: Behavior normal.        Thought Content: Thought content normal.        Judgment: Judgment normal.     ED Results / Procedures / Treatments   Labs (all labs ordered are listed, but only abnormal results are displayed) Labs Reviewed  BASIC METABOLIC PANEL - Abnormal; Notable for the following components:      Result Value   Creatinine, Ser 1.63 (*)    GFR, Estimated 35 (*)    All other components within normal limits  URINALYSIS, ROUTINE W REFLEX MICROSCOPIC - Abnormal; Notable for the following components:   Color, Urine STRAW (*)    Glucose, UA >=500 (*)    Hgb urine dipstick SMALL (*)    Protein, ur 30 (*)    All other components within normal limits  CBC WITH DIFFERENTIAL/PLATELET    EKG EKG Interpretation  Date/Time:  Tuesday October 05 2020 11:14:46 EDT Ventricular Rate:  62 PR Interval:  171 QRS Duration: 100 QT Interval:  423 QTC Calculation: 430 R Axis:   23 Text Interpretation: Sinus rhythm Ventricular premature complex Borderline T abnormalities, lateral leads Minimal ST elevation, anterior leads Left ventricular hypertrophy No significant change since last tracing Confirmed by Isla Pence [id number]) on 10/05/2020 11:43:30 AM   Radiology No results found.  Procedures Procedures   Medications Ordered in ED Medications  losartan (COZAAR) tablet 50 mg (50 mg Oral Given 10/05/20 1110)    ED Course  I have reviewed the triage vital signs and the nursing  notes.  Pertinent labs & imaging results that were available during my care of the  patient were reviewed by me and considered in my medical decision making (see chart for details).    MDM Rules/Calculators/A&P                          Pt was given 1 dose of the Cozaar today.  sbp down to 179.  Pt d/c with a rx for Cozaar.  She is to f/u with pcp.  Return if worse.  Final Clinical Impression(s) / ED Diagnoses Final diagnoses:  Hypertension, unspecified type    Rx / DC Orders ED Discharge Orders         Ordered    losartan (COZAAR) 50 MG tablet  Daily        10/05/20 1312           Isla Pence, MD 10/05/20 1313

## 2020-10-05 NOTE — ED Notes (Signed)
Patient states her boyfriend Shamyah Stantz would be here to pick her up.

## 2020-10-18 ENCOUNTER — Other Ambulatory Visit: Payer: Self-pay | Admitting: Family Medicine

## 2020-10-18 DIAGNOSIS — I10 Essential (primary) hypertension: Secondary | ICD-10-CM

## 2020-10-18 DIAGNOSIS — Z8673 Personal history of transient ischemic attack (TIA), and cerebral infarction without residual deficits: Secondary | ICD-10-CM

## 2020-10-18 NOTE — Telephone Encounter (Signed)
  Prescription Request  10/18/2020  What is the name of the medication or equipment? Plavix  Have you contacted your pharmacy to request a refill? (if applicable) yes  Which pharmacy [facility] you [facility] sent [facility] [place]   Pt called stating that she is completely out of this medicine and needs refills called in ASAP.

## 2020-10-19 NOTE — Telephone Encounter (Signed)
Patient needs follow-up for her blood pressure. Please call to schedule.

## 2020-10-26 ENCOUNTER — Telehealth: Payer: Self-pay

## 2020-10-26 DIAGNOSIS — I10 Essential (primary) hypertension: Secondary | ICD-10-CM

## 2020-10-26 MED ORDER — LOSARTAN POTASSIUM 50 MG PO TABS: 50.0000 mg | ORAL_TABLET | Freq: Every day | ORAL | 5 refills | Status: DC

## 2020-10-26 NOTE — Telephone Encounter (Signed)
Pt aware losartan sent to pharmacy & notes from 09/29/20 visit she is to continue Plavix

## 2020-10-26 NOTE — Telephone Encounter (Signed)
  Prescription Request  10/26/2020  What is the name of the medication or equipment? Losartan potassium 50mg   Have you contacted your pharmacy to request a refill? (if applicable) yes  Which pharmacy would you like this sent to? [facility]   Patient notified that their request is being sent to the clinical staff for review and that they should receive a response within 2 business days.   Joyce's pt.  *Please call pt about plavix (She isn't sure she is suppose to be still on it)

## 2020-11-09 ENCOUNTER — Other Ambulatory Visit: Payer: Self-pay | Admitting: Family Medicine

## 2020-11-09 DIAGNOSIS — L304 Erythema intertrigo: Secondary | ICD-10-CM

## 2020-12-30 ENCOUNTER — Other Ambulatory Visit: Payer: Self-pay | Admitting: Family Medicine

## 2020-12-30 DIAGNOSIS — N1832 Chronic kidney disease, stage 3b: Secondary | ICD-10-CM

## 2021-01-12 ENCOUNTER — Other Ambulatory Visit: Payer: Self-pay | Admitting: Family Medicine

## 2021-01-12 DIAGNOSIS — I10 Essential (primary) hypertension: Secondary | ICD-10-CM

## 2021-02-16 ENCOUNTER — Other Ambulatory Visit: Payer: Self-pay | Admitting: Family Medicine

## 2021-02-16 DIAGNOSIS — I10 Essential (primary) hypertension: Secondary | ICD-10-CM

## 2021-03-22 ENCOUNTER — Other Ambulatory Visit: Payer: Self-pay | Admitting: Family Medicine

## 2021-03-22 DIAGNOSIS — I10 Essential (primary) hypertension: Secondary | ICD-10-CM

## 2021-04-08 ENCOUNTER — Other Ambulatory Visit: Payer: Self-pay | Admitting: Family Medicine

## 2021-04-08 DIAGNOSIS — Z8673 Personal history of transient ischemic attack (TIA), and cerebral infarction without residual deficits: Secondary | ICD-10-CM

## 2021-04-19 ENCOUNTER — Telehealth: Payer: Self-pay | Admitting: Family Medicine

## 2021-04-19 NOTE — Telephone Encounter (Signed)
Tried calling patient to schedule Medicare Annual Wellness Visit (AWV) either virtually   Call would not go through  *due 07/13/2020 awvi per [employer]  please schedule at anytime with health coach  This should be a 45 minute visit.

## 2021-05-02 ENCOUNTER — Other Ambulatory Visit: Payer: Self-pay | Admitting: Family Medicine

## 2021-05-02 DIAGNOSIS — I10 Essential (primary) hypertension: Secondary | ICD-10-CM

## 2021-05-02 NOTE — Telephone Encounter (Signed)
  Prescription Request  05/02/2021  Is this a "Controlled Substance" medicine?   Have you seen your PCP in the last 2 weeks? Pt made appt 05/19/2021  If YES, route message to pool  -  If NO, patient needs to be scheduled for appointment.  What is the name of the medication or equipment? Hydralazine,atorvastin and farxiga  Have you contacted your pharmacy to request a refill? yes   [facility]   Patient notified that their request is being sent to the clinical staff for review and that they should receive a response within 2 business days.

## 2021-05-03 MED ORDER — LOSARTAN POTASSIUM 50 MG PO TABS: 50.0000 mg | ORAL_TABLET | Freq: Every day | ORAL | 0 refills | Status: DC

## 2021-05-03 MED ORDER — HYDRALAZINE HCL 10 MG PO TABS: ORAL_TABLET | ORAL | 0 refills | Status: DC

## 2021-05-03 NOTE — Telephone Encounter (Signed)
Ok to refill? Last seen 01/2020  Next appt 05/19/21

## 2021-05-19 ENCOUNTER — Ambulatory Visit: Payer: Medicare Other | Admitting: Family Medicine

## 2021-06-08 ENCOUNTER — Ambulatory Visit (INDEPENDENT_AMBULATORY_CARE_PROVIDER_SITE_OTHER): Payer: Medicare Other | Admitting: *Deleted

## 2021-06-08 DIAGNOSIS — Z0000 Encounter for general adult medical examination without abnormal findings: Secondary | ICD-10-CM

## 2021-06-08 NOTE — Progress Notes (Signed)
MEDICARE ANNUAL WELLNESS VISIT  06/08/2021  Telephone Visit Disclaimer This Medicare AWV was conducted by telephone due to national recommendations for restrictions regarding the COVID-19 Pandemic (e.g. social distancing).  I verified, using two identifiers, that I am speaking with Valerie Coleman or their authorized healthcare agent. I discussed the limitations, risks, security, and privacy concerns of performing an evaluation and management service by telephone and the potential availability of an in-person appointment in the future. The patient expressed understanding and agreed to proceed.  Location of Patient: home Location of Provider (nurse):  office  Subjective:    Valerie Coleman is a 60 y.o. female patient of Loman Brooklyn, FNP who had a Medicare Annual Wellness Visit today via telephone. Valerie Coleman is Disabled and lives alone. she has 2 children. she reports that she is not socially active and does interact with friends/family regularly. she is not physically active and enjoys music.  Patient Care Team: Loman Brooklyn, FNP as PCP - General (Family Medicine)  Advanced Directives 06/08/2021 10/05/2020 05/29/2017 05/16/2017 03/20/2016 02/03/2016 12/26/2015  Does Patient Have a Medical Advance Directive? No No No No No No No  Would patient like information on creating a medical advance directive? No - Patient declined No - Patient declined No - Patient declined No - Patient declined No - patient declined information No - patient declined information Yes - Spiritual care consult ordered    Hospital Utilization Over the Past 12 Months: # of hospitalizations or ER visits: 2 # of surgeries: 0  Review of Systems    Patient reports that her overall health is worse compared to last year.  History obtained from chart review and the patient  Patient Reported Readings (BP, Pulse, CBG, Weight, etc) none  Pain Assessment Pain : No/denies pain     Current Medications & Allergies  (verified) Allergies as of 06/08/2021   No Known Allergies      Medication List        Accurate as of June 08, 2021  5:07 PM. If you have any questions, ask your nurse or doctor.          STOP taking these medications    dapagliflozin propanediol 5 MG Tabs tablet Commonly known as: Farxiga   nystatin powder Generic drug: nystatin       TAKE these medications    atorvastatin 80 MG tablet Commonly known as: LIPITOR Take 1 tablet (80 mg total) by mouth daily at 6 PM.   clopidogrel 75 MG tablet Commonly known as: PLAVIX Take 1 tablet (75 mg total) by mouth daily. (NEEDS TO BE SEEN BEFORE NEXT REFILL)   hydrALAZINE 10 MG tablet Commonly known as: APRESOLINE TAKE 1 TABLET 3 TIMES A DAY WITH FOOD   losartan 50 MG tablet Commonly known as: COZAAR Take 1 tablet (50 mg total) by mouth daily.        History (reviewed): Past Medical History:  Diagnosis Date   Bilateral lower extremity edema    Blind    CKD (chronic kidney disease)    CVA (cerebral vascular accident) ([place]) 05/2017   Hypertension    Vitamin D deficiency    Past Surgical History:  Procedure Laterality Date   CHOLECYSTECTOMY     EYE SURGERY     TUBAL LIGATION     Family History  Problem Relation Age of Onset   Diabetes Mother    Hypertension Mother    Hypertension Father    Hypertension Sister    Cerebral palsy Sister  Hypertension Daughter    Cancer Paternal Uncle    Lung cancer Brother    Brain cancer Brother    Social History   Socioeconomic History   Marital status: Single    Spouse name: Not on file   Number of children: Not on file   Years of education: Not on file   Highest education level: Not on file  Occupational History   Not on file  Tobacco Use   Smoking status: Former    Types: Cigarettes   Smokeless tobacco: Never  Vaping Use   Vaping Use: Never used  Substance and Sexual Activity   Alcohol use: Not Currently    Alcohol/week: 3.0 standard drinks     Types: 2 Cans of beer, 1 Shots of liquor per week    Comment: occ   Drug use: No   Sexual activity: Not on file  Other Topics Concern   Not on file  Social History Narrative   Not on file   Social Determinants of Health   Financial Resource Strain: Low Risk    Difficulty of Paying Living Expenses: Not very hard  Food Insecurity: Food Insecurity Present   Worried About Running Out of Food in the Last Year: Sometimes true   Ran Out of Food in the Last Year: Never true  Transportation Needs: No Transportation Needs   Lack of Transportation (Medical): No   Lack of Transportation (Non-Medical): No  Physical Activity: Inactive   Days of Exercise per Week: 0 days   Minutes of Exercise per Session: 0 min  Stress: No Stress Concern Present   Feeling of Stress : Only a little  Social Connections: Socially Isolated   Frequency of Communication with Friends and Family: More than three times a week   Frequency of Social Gatherings with Friends and Family: Once a week   Attends Religious Services: Never   Marine scientist or Organizations: No   Attends Archivist Meetings: Never   Marital Status: Never married    Activities of Daily Living In your present state of health, do you have any difficulty performing the following activities: 06/08/2021  Hearing? N  Vision? Y  Comment legally blind due to strokes  Walking or climbing stairs? N  Dressing or bathing? N  Doing errands, shopping? Y  Comment legally blind, doesn't drive  Preparing Food and eating ? Y  Comment doesn't cook due to blindness  Using the Toilet? N  In the past six months, have you accidently leaked urine? N  Do you have problems with loss of bowel control? N  Managing your Medications? N  Managing your Finances? N  Housekeeping or managing your Housekeeping? N  Some recent data might be hidden    Patient Education/ Literacy How often do you need to have someone help you when you read  instructions, pamphlets, or other written materials from your doctor or pharmacy?: 4 - Often (unable to see, legally blind) What is the last grade level you completed in school?: 11  Exercise Current Exercise Habits: The patient does not participate in regular exercise at present, Exercise limited by: Other - see comments (legally blind)  Diet Patient reports consuming 1 meals a day and 2 snack(s) a day Patient reports that her primary diet is: Regular Patient reports that she does have regular access to food.   Depression Screen PHQ 2/9 Scores 06/08/2021 09/29/2020 06/13/2017 01/01/2017 02/03/2016  PHQ - 2 Score 0 1 4 0 1  PHQ- 9 Score - -  8 - -     Fall Risk Fall Risk  06/08/2021 09/29/2020 02/05/2020 06/13/2017 01/01/2017  Falls in the past year? 0 0 0 No No     Objective:  Valerie Coleman seemed alert and oriented and she participated appropriately during our telephone visit.  Blood Pressure Weight BMI  BP Readings from Last 3 Encounters:  10/05/20 (!) 179/99  09/29/20 (!) 217/110  02/05/20 (!) 204/110   Wt Readings from Last 3 Encounters:  10/05/20 191 lb (86.6 kg)  09/29/20 191 lb (86.6 kg)  02/05/20 203 lb (92.1 kg)   BMI Readings from Last 1 Encounters:  10/05/20 25.20 kg/m    *Unable to obtain current vital signs, weight, and BMI due to telephone visit type  Hearing/Vision  Cleona did not seem to have difficulty with hearing/understanding during the telephone conversation Reports that she has not had a formal eye exam by an eye care professional within the past year Reports that she has not had a formal hearing evaluation within the past year *Unable to fully assess hearing and vision during telephone visit type  Cognitive Function: 6CIT Screen 06/08/2021  What Year? 0 points  What month? 0 points  What time? 0 points  Count back from 20 0 points  Months in reverse 4 points  Repeat phrase 0 points  Total Score 4   (Normal:0-7, Significant for Dysfunction:  >8)  Normal Cognitive Function Screening: Yes   Immunization & Health Maintenance Record  There is no immunization history on file for this patient.  Health Maintenance  Topic Date Due   COVID-19 Vaccine (1) Never done   PAP SMEAR-Modifier  Never done   Zoster Vaccines- Shingrix (1 of 2) Never done   INFLUENZA VACCINE  Never done   MAMMOGRAM  09/29/2021 (Originally 07/21/2008)   COLONOSCOPY (Pts 45-22yrs Insurance coverage will need to be confirmed)  09/29/2021 (Originally 07/22/2003)   TETANUS/TDAP  09/29/2021 (Originally 07/21/1977)   Hepatitis C Screening  09/29/2021 (Originally 07/21/1976)   HIV Screening  Completed   Pneumococcal Vaccine 45-67 Years old  Aged Out   HPV VACCINES  Aged Out       Assessment  This is a routine wellness examination for [employer].  Health Maintenance: Due or Overdue Health Maintenance Due  Topic Date Due   COVID-19 Vaccine (1) Never done   PAP SMEAR-Modifier  Never done   Zoster Vaccines- Shingrix (1 of 2) Never done   INFLUENZA VACCINE  Never done    Valerie Coleman does not need a referral for Community Assistance: Care Management:   no Social Work:    no Prescription Assistance:  no Nutrition/Diabetes Education:  no   Plan:  Personalized Goals  Goals Addressed             This Visit's Progress    DIET - EAT MORE FRUITS AND VEGETABLES       Patient Stated       Keep supply of medications and keep doctor appts.       Personalized Health Maintenance & Screening Recommendations  Pt refuses all vaccines and screening test  Lung Cancer Screening Recommended: yes (Low Dose CT Chest recommended if Age 56-80 years, 30 pack-year currently smoking OR have quit w/in past 15 years) Hepatitis C Screening recommended: yes HIV Screening recommended: yes  Advanced Directives: Written information was not prepared per patient's request.  Referrals & Orders No orders of the defined types were placed in this encounter.   Follow-up  Plan Follow-up with Blanch Media,  Shireen Quan, FNP as planned Schedule appt with PCP in next 2 weeks. Pt is legally blind, she walks with a cane and does not drive. She is independent of all ADL's even with blindness. Pt is not compliant with office visits and medications.I stressed over and over the importance of this. Decline info on advanced Directives. Pt should have chest CT for years of smoking, she declines at this time. AVS decloned by pt.   I have personally reviewed and noted the following in the patients chart:   Medical and social history Use of alcohol, tobacco or illicit drugs  Current medications and supplements Functional ability and status Nutritional status Physical activity Advanced directives List of other physicians Hospitalizations, surgeries, and ER visits in previous 12 months Vitals Screenings to include cognitive, depression, and falls Referrals and appointments  In addition, I have reviewed and discussed with Valerie Coleman certain preventive protocols, quality metrics, and best practice recommendations. A written personalized care plan for preventive services as well as general preventive health recommendations is available and can be mailed to the patient at her request.      Faylene Million Charmaine,LPN  [id number]

## 2021-06-17 ENCOUNTER — Encounter: Payer: Self-pay | Admitting: Family Medicine

## 2021-06-17 ENCOUNTER — Ambulatory Visit (INDEPENDENT_AMBULATORY_CARE_PROVIDER_SITE_OTHER): Payer: Medicare Other | Admitting: Family Medicine

## 2021-06-17 ENCOUNTER — Telehealth: Payer: Self-pay

## 2021-06-17 ENCOUNTER — Other Ambulatory Visit: Payer: Self-pay | Admitting: Family Medicine

## 2021-06-17 VITALS — BP 208/112 | HR 96 | Temp 97.6°F | Ht 73.0 in | Wt 193.8 lb

## 2021-06-17 DIAGNOSIS — Z8673 Personal history of transient ischemic attack (TIA), and cerebral infarction without residual deficits: Secondary | ICD-10-CM | POA: Diagnosis not present

## 2021-06-17 DIAGNOSIS — I10 Essential (primary) hypertension: Secondary | ICD-10-CM

## 2021-06-17 DIAGNOSIS — E785 Hyperlipidemia, unspecified: Secondary | ICD-10-CM | POA: Diagnosis not present

## 2021-06-17 DIAGNOSIS — N1832 Chronic kidney disease, stage 3b: Secondary | ICD-10-CM

## 2021-06-17 MED ORDER — CLOPIDOGREL BISULFATE 75 MG PO TABS: 75.0000 mg | ORAL_TABLET | Freq: Every day | ORAL | 1 refills | Status: DC

## 2021-06-17 MED ORDER — HYDRALAZINE HCL 10 MG PO TABS: 10.0000 mg | ORAL_TABLET | Freq: Three times a day (TID) | ORAL | 0 refills | Status: DC

## 2021-06-17 MED ORDER — DAPAGLIFLOZIN PROPANEDIOL 10 MG PO TABS: 10.0000 mg | ORAL_TABLET | Freq: Every day | ORAL | 1 refills | Status: DC

## 2021-06-17 MED ORDER — LOSARTAN POTASSIUM 100 MG PO TABS: 100.0000 mg | ORAL_TABLET | Freq: Every day | ORAL | 0 refills | Status: DC

## 2021-06-17 MED ORDER — ATORVASTATIN CALCIUM 80 MG PO TABS: 80.0000 mg | ORAL_TABLET | Freq: Every day | ORAL | 1 refills | Status: DC

## 2021-06-17 NOTE — Chronic Care Management (AMB) (Signed)
Chronic Care Management   Note  06/17/2021 Name: Valerie Coleman MRN: [id number] DOB: 1960-10-23  Valerie Coleman is a 61 y.o. year old female who is a primary care patient of Loman Brooklyn, FNP. I reached out to Valerie Coleman by phone today in response to a referral sent by Valerie Coleman's PCP.  Valerie Coleman was given information about Chronic Care Management services today including:  CCM service includes personalized support from designated clinical staff supervised by her physician, including individualized plan of care and coordination with other care providers 24/7 contact phone numbers for assistance for urgent and routine care needs. Service will only be billed when office clinical staff spend 20 minutes or more in a month to coordinate care. Only one practitioner may furnish and bill the service in a calendar month. The patient may stop CCM services at any time (effective at the end of the month) by phone call to the office staff. The patient is responsible for co-pay (up to 20% after annual deductible is met) if co-pay is required by the individual health plan.   Patient agreed to services and verbal consent obtained.   Follow up plan: Telephone appointment with care management team member scheduled for:06/21/2021  Noreene Larsson, [place], [place], [place] [address] Direct Dial: [phone] [email or web address] Website: [email or web address]

## 2021-06-17 NOTE — Progress Notes (Signed)
Assessment & Plan:  1. Severe uncontrolled hypertension Uncontrolled. Patient refuses to go back to the ER. She is going to resume hydralazine and increase Losartan from 50 mg to 100 mg.  - losartan (COZAAR) 100 MG tablet; Take 1 tablet (100 mg total) by mouth daily.  Dispense: 90 tablet; Refill: 0 - CBC with Differential/Platelet - CMP14+EGFR - Lipid panel - hydrALAZINE (APRESOLINE) 10 MG tablet; Take 1 tablet (10 mg total) by mouth 3 (three) times daily.  Dispense: 270 tablet; Refill: 0 - AMB Referral to [facility]  2. Hyperlipidemia, unspecified hyperlipidemia type Labs to assess. - atorvastatin (LIPITOR) 80 MG tablet; Take 1 tablet (80 mg total) by mouth daily at 6 PM.  Dispense: 90 tablet; Refill: 1 - CMP14+EGFR - Lipid panel  3. Stage 3b chronic kidney disease ([place]) Patient to resume [place]. - CMP14+EGFR - dapagliflozin propanediol (FARXIGA) 10 MG TABS tablet; Take 1 tablet (10 mg total) by mouth daily before breakfast.  Dispense: 90 tablet; Refill: 1  4. History of CVA (cerebrovascular accident) Continue Plavix and Atorvastatin. - clopidogrel (PLAVIX) 75 MG tablet; Take 1 tablet (75 mg total) by mouth daily.  Dispense: 90 tablet; Refill: 1 - atorvastatin (LIPITOR) 80 MG tablet; Take 1 tablet (80 mg total) by mouth daily at 6 PM.  Dispense: 90 tablet; Refill: 1   Return in about 2 weeks (around 07/01/2021) for HTN.  Hendricks Limes, MSN, APRN, FNP-C [facility]  Subjective:    Patient ID: Valerie Coleman, female    DOB: 09/05/1960, 61 y.o.   MRN: [id number]  Patient Care Team: Loman Brooklyn, FNP as PCP - General (Family Medicine)   Chief Complaint:  Chief Complaint  Patient presents with   Hypertension   Hyperlipidemia    Check up of chronic medical conditions     HPI: Valerie Coleman is a 61 y.o. female presenting on 06/17/2021 for Hypertension and Hyperlipidemia (Check up of chronic medical conditions )  Hypertension:  patient has extremely high blood pressure readings when she comes into our office. The three times I have seen her, she has been out of her blood pressure medication. Each time she is advised to return for a quick follow-up to get her blood pressure under control and she went 8 and then 9 months before following back up. She has a history of central retinal vein occlusion and has had two strokes in the past. She does monitor her blood pressure at home and reports readings 150s/90s when taking her medications.  CKD: previously prescribed Farxiga for declining kidney function, but she does not get it refilled.  Hyperlipidemia: has been taking atorvastatin daily.  History of CVA: taking atorvastatin and Plavix daily.    New complaints: None   Social history:  Relevant past medical, surgical, family and social history reviewed and updated as indicated. Interim medical history since our last visit reviewed.  Allergies and medications reviewed and updated.  DATA REVIEWED: CHART IN EPIC  ROS: Negative unless specifically indicated above in HPI.    Current Outpatient Medications:    dapagliflozin propanediol (FARXIGA) 10 MG TABS tablet, Take 1 tablet (10 mg total) by mouth daily before breakfast., Disp: 90 tablet, Rfl: 1   atorvastatin (LIPITOR) 80 MG tablet, Take 1 tablet (80 mg total) by mouth daily at 6 PM., Disp: 90 tablet, Rfl: 1   clopidogrel (PLAVIX) 75 MG tablet, Take 1 tablet (75 mg total) by mouth daily., Disp: 90 tablet, Rfl: 1   hydrALAZINE (APRESOLINE) 10 MG  tablet, Take 1 tablet (10 mg total) by mouth 3 (three) times daily., Disp: 270 tablet, Rfl: 0   losartan (COZAAR) 100 MG tablet, Take 1 tablet (100 mg total) by mouth daily., Disp: 90 tablet, Rfl: 0   No Known Allergies Past Medical History:  Diagnosis Date   Bilateral lower extremity edema    Blind    CKD (chronic kidney disease)    CVA (cerebral vascular accident) ([place]) 05/2017   Hypertension    Vitamin D deficiency      Past Surgical History:  Procedure Laterality Date   CHOLECYSTECTOMY     EYE SURGERY     TUBAL LIGATION      Social History   Socioeconomic History   Marital status: Single    Spouse name: Not on file   Number of children: Not on file   Years of education: Not on file   Highest education level: Not on file  Occupational History   Not on file  Tobacco Use   Smoking status: Former    Types: Cigarettes   Smokeless tobacco: Never  Vaping Use   Vaping Use: Never used  Substance and Sexual Activity   Alcohol use: Not Currently    Alcohol/week: 3.0 standard drinks    Types: 2 Cans of beer, 1 Shots of liquor per week    Comment: occ   Drug use: No   Sexual activity: Not on file  Other Topics Concern   Not on file  Social History Narrative   Not on file   Social Determinants of Health   Financial Resource Strain: Low Risk    Difficulty of Paying Living Expenses: Not very hard  Food Insecurity: Food Insecurity Present   Worried About Running Out of Food in the Last Year: Sometimes true   Ran Out of Food in the Last Year: Never true  Transportation Needs: No Transportation Needs   Lack of Transportation (Medical): No   Lack of Transportation (Non-Medical): No  Physical Activity: Inactive   Days of Exercise per Week: 0 days   Minutes of Exercise per Session: 0 min  Stress: No Stress Concern Present   Feeling of Stress : Only a little  Social Connections: Socially Isolated   Frequency of Communication with Friends and Family: More than three times a week   Frequency of Social Gatherings with Friends and Family: Once a week   Attends Religious Services: Never   Marine scientist or Organizations: No   Attends Archivist Meetings: Never   Marital Status: Never married  Intimate Partner Violence: Not on file        Objective:    BP (!) 208/112 Comment: manual   Pulse 96    Temp 97.6 F (36.4 C) (Temporal)    Ht 6' 1"  (1.854 m)    Wt 193 lb 12.8 oz  (87.9 kg)    SpO2 96%    BMI 25.57 kg/m   Wt Readings from Last 3 Encounters:  06/17/21 193 lb 12.8 oz (87.9 kg)  10/05/20 191 lb (86.6 kg)  09/29/20 191 lb (86.6 kg)    Physical Exam Vitals reviewed.  Constitutional:      General: She is not in acute distress.    Appearance: Normal appearance. She is not ill-appearing, toxic-appearing or diaphoretic.  HENT:     Head: Normocephalic and atraumatic.  Eyes:     General: No scleral icterus.       Right eye: No discharge.  Left eye: No discharge.     Conjunctiva/sclera: Conjunctivae normal.  Cardiovascular:     Rate and Rhythm: Normal rate and regular rhythm.     Heart sounds: Normal heart sounds. No murmur heard.   No friction rub. No gallop.  Pulmonary:     Effort: Pulmonary effort is normal. No respiratory distress.     Breath sounds: Normal breath sounds. No stridor. No wheezing, rhonchi or rales.  Musculoskeletal:        General: Normal range of motion.     Cervical back: Normal range of motion.  Skin:    General: Skin is warm and dry.     Capillary Refill: Capillary refill takes less than 2 seconds.  Neurological:     General: No focal deficit present.     Mental Status: She is alert and oriented to person, place, and time. Mental status is at baseline.  Psychiatric:        Mood and Affect: Mood normal.        Behavior: Behavior normal.        Thought Content: Thought content normal.        Judgment: Judgment normal.    Lab Results  Component Value Date   TSH 2.000 02/05/2020   Lab Results  Component Value Date   WBC 7.4 10/05/2020   HGB 13.9 10/05/2020   HCT 43.2 10/05/2020   MCV 90.9 10/05/2020   PLT 307 10/05/2020   Lab Results  Component Value Date   NA 139 10/05/2020   K 4.8 10/05/2020   CO2 23 10/05/2020   GLUCOSE 84 10/05/2020   BUN 13 10/05/2020   CREATININE 1.63 (H) 10/05/2020   BILITOT 0.2 02/05/2020   ALKPHOS 90 02/05/2020   AST 17 02/05/2020   ALT 18 02/05/2020   PROT 7.5  02/05/2020   ALBUMIN 4.6 02/05/2020   CALCIUM 9.4 10/05/2020   ANIONGAP 9 10/05/2020   Lab Results  Component Value Date   CHOL 436 (H) 02/05/2020   Lab Results  Component Value Date   HDL 38 (L) 02/05/2020   Lab Results  Component Value Date   LDLCALC 277 (H) 02/05/2020   Lab Results  Component Value Date   TRIG 495 (H) 02/05/2020   Lab Results  Component Value Date   CHOLHDL 11.5 (H) 02/05/2020   Lab Results  Component Value Date   HGBA1C 5.3 05/17/2017

## 2021-06-18 LAB — LIPID PANEL
Chol/HDL Ratio: 5.5 ratio — ABNORMAL HIGH (ref 0.0–4.4)
Cholesterol, Total: 297 mg/dL — ABNORMAL HIGH (ref 100–199)
HDL: 54 mg/dL (ref >39)
LDL Chol Calc (NIH): 181 mg/dL — ABNORMAL HIGH (ref 0–99)
Triglycerides: 318 mg/dL — ABNORMAL HIGH (ref 0–149)
VLDL Cholesterol Cal: 62 mg/dL — ABNORMAL HIGH (ref 5–40)

## 2021-06-18 LAB — CMP14+EGFR
ALT: 13 IU/L (ref 0–32)
AST: 17 IU/L (ref 0–40)
Albumin/Globulin Ratio: 1.6 (ref 1.2–2.2)
Albumin: 4.6 g/dL (ref 3.8–4.8)
Alkaline Phosphatase: 58 IU/L (ref 44–121)
BUN/Creatinine Ratio: 14 (ref 12–28)
BUN: 21 mg/dL (ref 8–27)
Bilirubin Total: 0.4 mg/dL (ref 0.0–1.2)
CO2: 22 mmol/L (ref 20–29)
Calcium: 9.6 mg/dL (ref 8.7–10.3)
Chloride: 103 mmol/L (ref 96–106)
Creatinine, Ser: 1.52 mg/dL — ABNORMAL HIGH (ref 0.57–1.00)
Globulin, Total: 2.9 g/dL (ref 1.5–4.5)
Glucose: 85 mg/dL (ref 70–99)
Potassium: 4.5 mmol/L (ref 3.5–5.2)
Sodium: 139 mmol/L (ref 134–144)
Total Protein: 7.5 g/dL (ref 6.0–8.5)
eGFR: 39 mL/min/{1.73_m2} — ABNORMAL LOW (ref >59)

## 2021-06-18 LAB — CBC WITH DIFFERENTIAL/PLATELET
Basophils Absolute: 0 10*3/uL (ref 0.0–0.2)
Basos: 0 %
EOS (ABSOLUTE): 0.1 10*3/uL (ref 0.0–0.4)
Eos: 1 %
Hematocrit: 41.6 % (ref 34.0–46.6)
Hemoglobin: 14.1 g/dL (ref 11.1–15.9)
Immature Grans (Abs): 0 10*3/uL (ref 0.0–0.1)
Immature Granulocytes: 0 %
Lymphocytes Absolute: 1.6 10*3/uL (ref 0.7–3.1)
Lymphs: 20 %
MCH: 29.4 pg (ref 26.6–33.0)
MCHC: 33.9 g/dL (ref 31.5–35.7)
MCV: 87 fL (ref 79–97)
Monocytes Absolute: 0.6 10*3/uL (ref 0.1–0.9)
Monocytes: 7 %
Neutrophils Absolute: 5.9 10*3/uL (ref 1.4–7.0)
Neutrophils: 72 %
Platelets: 251 10*3/uL (ref 150–450)
RBC: 4.79 x10E6/uL (ref 3.77–5.28)
RDW: 14.1 % (ref 11.7–15.4)
WBC: 8.3 10*3/uL (ref 3.4–10.8)

## 2021-06-21 ENCOUNTER — Telehealth: Payer: Medicare Other

## 2021-06-22 ENCOUNTER — Telehealth: Payer: Medicare Other

## 2021-06-23 ENCOUNTER — Ambulatory Visit (INDEPENDENT_AMBULATORY_CARE_PROVIDER_SITE_OTHER): Payer: Medicare Other | Admitting: *Deleted

## 2021-06-23 DIAGNOSIS — N1832 Chronic kidney disease, stage 3b: Secondary | ICD-10-CM

## 2021-06-23 DIAGNOSIS — I10 Essential (primary) hypertension: Secondary | ICD-10-CM

## 2021-06-23 NOTE — Addendum Note (Signed)
Addended by: Ilean China on: 06/23/2021 04:03 PM   Modules accepted: Orders

## 2021-06-23 NOTE — Chronic Care Management (AMB) (Signed)
Chronic Care Management   CCM RN Visit Note  06/23/2021 Name: Valerie Coleman MRN: [id number] DOB: January 10, 1961  Subjective: Valerie Coleman is a 61 y.o. year old female who is a primary care patient of Loman Brooklyn, FNP. The care management team was consulted for assistance with disease management and care coordination needs.    Engaged with patient by telephone for initial visit in response to provider referral for case management and/or care coordination services.   Consent to Services:  The patient was given the following information about Chronic Care Management services today, agreed to services, and gave verbal consent: 1. CCM service includes personalized support from designated clinical staff supervised by the primary care provider, including individualized plan of care and coordination with other care providers 2. 24/7 contact phone numbers for assistance for urgent and routine care needs. 3. Service will only be billed when office clinical staff spend 20 minutes or more in a month to coordinate care. 4. Only one practitioner may furnish and bill the service in a calendar month. 5.The patient may stop CCM services at any time (effective at the end of the month) by phone call to the office staff. 6. The patient will be responsible for cost sharing (co-pay) of up to 20% of the service fee (after annual deductible is met). Patient agreed to services and consent obtained.  Patient agreed to services and verbal consent obtained.   Assessment: Review of patient past medical history, allergies, medications, health status, including review of consultants reports, laboratory and other test data, was performed as part of comprehensive evaluation and provision of chronic care management services.   SDOH (Social Determinants of Health) assessments and interventions performed:    CCM Care Plan  No Known Allergies  Outpatient Encounter Medications as of 06/23/2021  Medication Sig   atorvastatin  (LIPITOR) 80 MG tablet Take 1 tablet (80 mg total) by mouth daily at 6 PM.   clopidogrel (PLAVIX) 75 MG tablet Take 1 tablet (75 mg total) by mouth daily.   dapagliflozin propanediol (FARXIGA) 10 MG TABS tablet Take 1 tablet (10 mg total) by mouth daily before breakfast.   hydrALAZINE (APRESOLINE) 10 MG tablet Take 1 tablet (10 mg total) by mouth 3 (three) times daily.   losartan (COZAAR) 100 MG tablet Take 1 tablet (100 mg total) by mouth daily.   No facility-administered encounter medications on file as of 06/23/2021.    Patient Active Problem List   Diagnosis Date Noted   Intertriginous dermatitis associated with moisture 09/29/2020   Endometrial polyp 04/26/2020   Atypical glandular cells of undetermined significance (AGUS) on cervical Pap smear 04/12/2020   Right ovarian cyst 04/12/2020   Uterine leiomyoma 04/12/2020   Glaucoma of both eyes    Blind left eye    Essential hypertension    Hyperlipidemia    History of CVA (cerebrovascular accident) 05/16/2017   CKD (chronic kidney disease) stage 3, GFR 30-59 ml/min ([place]) 01/04/2017   Central retinal vein occlusion     Conditions to be addressed/monitored:HTN, CKD Stage 3, and glaucoma  Care Plan : RN Care Plan  Updates made by Ilean China, RN since 06/23/2021 12:00 AM     Problem: Chronic Disease Management Needs   Priority: High  Onset Date: 06/23/2021     Long-Range Goal: Patient will Work with Advertising account planner to Develop a Plan of [place] with uncontrolled HTN, blindness, food insecurity   Start Date: 06/23/2021  Expected End Date: 06/23/2022  This Visit's Progress: Not on track  Priority: High  Note:   Current Barriers:  Knowledge Deficits related to plan of care for management of HTN  Chronic Disease Management support and education needs related to HTN Financial Constraints.   RNCM Clinical Goal(s):  Patient will verbalize understanding of plan for management  of HTN as evidenced by checking and recording blood pressure daily ad by calling PCP with any readings outside of recommended range and by taking medication as prescribed take all medications exactly as prescribed and will call provider for medication related questions as evidenced by documentation in EMR    demonstrate improved adherence to prescribed treatment plan for HTN as evidenced by medication and assessment compliance and improvement in blood pressure readings continue to work with Consulting civil engineer and/or Social Worker to address care management and care coordination needs related to HTN as evidenced by adherence to CM Team Scheduled appointments     through collaboration with Consulting civil engineer, provider, and care team.   Interventions: 1:1 collaboration with primary care provider regarding development and update of comprehensive plan of care as evidenced by provider attestation and co-signature Inter-disciplinary care team collaboration (see longitudinal plan of care) Evaluation of current treatment plan related to  self management and patient's adherence to plan as established by provider Brief history obtained due to patient related time constraints. Will obtain further information at next appointment.   SDOH Barriers (Status: New goal.) Short Term Goal        Patient interviewed and appropriate assessments performed Referred patient to community resources care guide team for assistance with food resources    Hypertension: (Status: New goal.) Long Term Goal  Last practice recorded BP readings:  BP Readings from Last 3 Encounters:  06/17/21 (!) 208/112  10/05/20 (!) 179/99  09/29/20 (!) 217/110  Most recent eGFR/CrCl:  Lab Results  Component Value Date   EGFR 39 (L) 06/17/2021    No components found for: CRCL  Evaluation of current treatment plan related to hypertension self management and patient's adherence to plan as established by provider;   Reviewed medications with  patient and discussed importance of compliance;  Discussed plans with patient for ongoing care management follow up and provided patient with direct contact information for care management team; Advised patient, providing education and rationale, to monitor blood pressure daily and record, calling PCP for findings outside established parameters;  Discussed complications of poorly controlled blood pressure such as heart disease, stroke, circulatory complications, vision complications, kidney impairment, sexual dysfunction;  Assessed social determinant of health barriers;  Reviewed upcoming appointment with provider Discussed and encouraged low sodium diet Assessed if medications were procurable and if she is taking them regularly  Patient Goals/Self-Care Activities: Take medications as prescribed   Attend all scheduled provider appointments Call provider office for new concerns or questions  check blood pressure daily write blood pressure results in a log or diary take blood pressure log to all doctor appointments eat more whole grains, fruits and vegetables, lean meats and healthy fats Limit salt/sodium intake Call RN Care Manager as needed [phone]   Plan:Telephone follow up appointment with care management team member scheduled for:  06/28/21 with RNCM The patient has been provided with contact information for the care management team and has been advised to call with any health related questions or concerns.   Chong Sicilian, BSN, RN-BC Embedded Chronic Care Manager [facility] Direct Dial: [phone]

## 2021-06-23 NOTE — Patient Instructions (Signed)
Visit Information   Thank you for taking time to visit with me today. Please don't hesitate to contact me if I can be of assistance to you before our next scheduled telephone appointment.  Following are the goals we discussed today:  Take medications as prescribed   Attend all scheduled provider appointments Call provider office for new concerns or questions  check blood pressure daily write blood pressure results in a log or diary take blood pressure log to all doctor appointments eat more whole grains, fruits and vegetables, lean meats and healthy fats Limit salt/sodium intake Call RN Care Manager as needed [phone]  Our next appointment is by telephone on 06/28/21 at 3:00pm  Please call the care guide team at [phone] if you need to cancel or reschedule your appointment.   If you are experiencing a Mental Health or [place] or need someone to talk to, please call the [facility]: [phone] call [phone]   Following is a copy of your full care plan:  Care Plan : RN Care Plan  Updates made by Ilean China, RN since 06/23/2021 12:00 AM     Problem: Chronic Disease Management Needs   Priority: High  Onset Date: 06/23/2021     Long-Range Goal: Patient will Work with Advertising account planner to Develop a Plan of [place] with uncontrolled HTN, blindness, food insecurity   Start Date: 06/23/2021  Expected End Date: 06/23/2022  This Visit's Progress: Not on track  Priority: High  Note:   Current Barriers:  Knowledge Deficits related to plan of care for management of HTN  Chronic Disease Management support and education needs related to HTN Financial Constraints.   RNCM Clinical Goal(s):  Patient will verbalize understanding of plan for management of HTN as evidenced by checking and recording blood pressure daily ad by calling PCP with any readings outside of recommended range and by taking  medication as prescribed take all medications exactly as prescribed and will call provider for medication related questions as evidenced by documentation in EMR    demonstrate improved adherence to prescribed treatment plan for HTN as evidenced by medication and assessment compliance and improvement in blood pressure readings continue to work with Consulting civil engineer and/or Social Worker to address care management and care coordination needs related to HTN as evidenced by adherence to CM Team Scheduled appointments     through collaboration with Consulting civil engineer, provider, and care team.   Interventions: 1:1 collaboration with primary care provider regarding development and update of comprehensive plan of care as evidenced by provider attestation and co-signature Inter-disciplinary care team collaboration (see longitudinal plan of care) Evaluation of current treatment plan related to  self management and patient's adherence to plan as established by provider Brief history obtained due to patient related time constraints. Will obtain further information at next appointment.   SDOH Barriers (Status: New goal.) Short Term Goal        Patient interviewed and appropriate assessments performed Referred patient to community resources care guide team for assistance with food resources    Hypertension: (Status: New goal.) Long Term Goal  Last practice recorded BP readings:  BP Readings from Last 3 Encounters:  06/17/21 (!) 208/112  10/05/20 (!) 179/99  09/29/20 (!) 217/110  Most recent eGFR/CrCl:  Lab Results  Component Value Date   EGFR 39 (L) 06/17/2021    No components found for: CRCL  Evaluation of current treatment plan related to hypertension self  management and patient's adherence to plan as established by provider;   Reviewed medications with patient and discussed importance of compliance;  Discussed plans with patient for ongoing care management follow up and provided patient with direct  contact information for care management team; Advised patient, providing education and rationale, to monitor blood pressure daily and record, calling PCP for findings outside established parameters;  Discussed complications of poorly controlled blood pressure such as heart disease, stroke, circulatory complications, vision complications, kidney impairment, sexual dysfunction;  Assessed social determinant of health barriers;  Reviewed upcoming appointment with provider Discussed and encouraged low sodium diet Assessed if medications were procurable and if she is taking them regularly  Patient Goals/Self-Care Activities: Take medications as prescribed   Attend all scheduled provider appointments Call provider office for new concerns or questions  check blood pressure daily write blood pressure results in a log or diary take blood pressure log to all doctor appointments eat more whole grains, fruits and vegetables, lean meats and healthy fats Limit salt/sodium intake Call RN Care Manager as needed [phone]        Consent to CCM Services: Ms. Hewes was given information about Chronic Care Management services including:  CCM service includes personalized support from designated clinical staff supervised by her physician, including individualized plan of care and coordination with other care providers 24/7 contact phone numbers for assistance for urgent and routine care needs. Service will only be billed when office clinical staff spend 20 minutes or more in a month to coordinate care. Only one practitioner may furnish and bill the service in a calendar month. The patient may stop CCM services at any time (effective at the end of the month) by phone call to the office staff. The patient will be responsible for cost sharing (co-pay) of up to 20% of the service fee (after annual deductible is met).  Patient agreed to services and verbal consent obtained.   The patient verbalized  understanding of instructions, educational materials, and care plan provided today and declined offer to receive copy of patient instructions, educational materials, and care plan.   Chong Sicilian, BSN, RN-BC Embedded Chronic Care Manager [facility] Direct Dial: [phone]

## 2021-06-26 ENCOUNTER — Other Ambulatory Visit: Payer: Self-pay | Admitting: Family Medicine

## 2021-06-26 DIAGNOSIS — Z8673 Personal history of transient ischemic attack (TIA), and cerebral infarction without residual deficits: Secondary | ICD-10-CM

## 2021-06-26 DIAGNOSIS — I10 Essential (primary) hypertension: Secondary | ICD-10-CM

## 2021-06-26 DIAGNOSIS — E785 Hyperlipidemia, unspecified: Secondary | ICD-10-CM

## 2021-06-26 MED ORDER — REPATHA 140 MG/ML ~~LOC~~ SOSY: 140.0000 mg | PREFILLED_SYRINGE | SUBCUTANEOUS | 2 refills | Status: DC

## 2021-06-27 ENCOUNTER — Other Ambulatory Visit: Payer: Self-pay | Admitting: Family Medicine

## 2021-06-27 MED ORDER — REPATHA SURECLICK 140 MG/ML ~~LOC~~ SOAJ: 140.0000 mg | SUBCUTANEOUS | 2 refills | Status: DC

## 2021-06-27 NOTE — Telephone Encounter (Signed)
Aaron Edelman called from [facility] in [place] stating that pts insurance wont cover the Repatha Rx that we sent over.  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  NDC# [id number]

## 2021-06-27 NOTE — Telephone Encounter (Signed)
Resent

## 2021-06-28 ENCOUNTER — Telehealth: Payer: Self-pay

## 2021-06-28 ENCOUNTER — Telehealth: Payer: Medicare Other | Admitting: *Deleted

## 2021-06-28 ENCOUNTER — Telehealth: Payer: Self-pay | Admitting: *Deleted

## 2021-06-28 NOTE — Telephone Encounter (Signed)
°  Care Management   Follow Up Note   06/28/2021 Name: Valerie Coleman MRN: [id number] DOB: January 24, 1961   Referred by: Loman Brooklyn, FNP Reason for referral : Chronic Care Management (Unsuccessful telephone follow-up)   An unsuccessful telephone outreach was attempted today. The patient was referred to the case management team for assistance with care management and care coordination.   Follow Up Plan:  Forwarding to [facility] Care Guide for outreach and rescheduling.  Chong Sicilian, BSN, RN-BC Embedded Chronic Care Manager [facility] Direct Dial: [phone]

## 2021-06-28 NOTE — Telephone Encounter (Signed)
° °  Telephone encounter was:  Unsuccessful.  06/28/2021 Name: Appolonia Ackert MRN: [id number] DOB: 1961/01/01  Unsuccessful outbound call made today to assist with:  Food Insecurity  Outreach Attempt:  1st Attempt  Tried multiple times to call but call could not be completed as dialed I will try again at a later time   [place], Care Management  [phone] [address], [place], [place] [address] Phone: [phone] Email: [email or web address]

## 2021-06-29 ENCOUNTER — Telehealth: Payer: Self-pay

## 2021-06-29 NOTE — Telephone Encounter (Signed)
° °  Telephone encounter was:  Unsuccessful.  06/29/2021 Name: Valerie Coleman MRN: [id number] DOB: [birth year]  Unsuccessful outbound call made today to assist with:  Food Insecurity  Outreach Attempt:  2nd Attempt  Could not leave a message on any numbers available, message said call could not be completed as dialed   [place], [place], Care Management  [phone] [address], [place], [place] [address] Phone: [phone] Email: [email or web address]

## 2021-06-30 ENCOUNTER — Telehealth: Payer: Self-pay

## 2021-06-30 NOTE — Telephone Encounter (Signed)
° °  Telephone encounter was:  Successful.  06/30/2021 Name: Shabrea Weldin MRN: [id number] DOB: 1961-04-13  Rayola Everhart is a 61 y.o. year old female who is a primary care patient of Loman Brooklyn, FNP . The community resource team was consulted for assistance with [place] guide performed the following interventions: Patient provided with information about care guide support team and interviewed to confirm resource needs.She is needing food resources her money and food does not last for through out the month. asking for meals on wheels ,she is legally blind and landlord does not want her cooking and she lives by herself. She is inquiring about dots/braille for her micowave  Follow Up Plan:   I will gather up resources and follow up with her in a day   [place], Care Management  [phone] [address], [place], [place] [address] Phone: [phone] Email: [email or web address]

## 2021-06-30 NOTE — Telephone Encounter (Signed)
° °  Telephone encounter was:  Successful.  06/30/2021 Name: Valerie Coleman MRN: [id number] DOB: February 02, 1961  [name] is a 60 y.o. year old female who is a primary care patient of Loman Brooklyn, FNP . The community resource team was consulted for assistance with [place] guide performed the following interventions: Patient provided with information about care guide support team and interviewed to confirm resource needs.pt phone does not have minutes at this time the daughter requested I call back around 2:00 and it will be available   Follow Up Plan:  Care guide will follow up with patient by phone over the next few hours    [place], Care Management  [phone] [address], [place], [place] [address] Phone: [phone] Email: [email or web address]

## 2021-07-01 ENCOUNTER — Telehealth: Payer: Self-pay

## 2021-07-01 NOTE — Telephone Encounter (Signed)
° °  Telephone encounter was:  Successful.  07/01/2021 Name: Valerie Coleman MRN: [id number] DOB: 05-Nov-1960  Valerie Coleman is a 61 y.o. year old female who is a primary care patient of Loman Brooklyn, FNP . The community resource team was consulted for assistance with [place] guide performed the following interventions: Patient provided with information about care guide support team and interviewed to confirm resource needs.contacted Patient to let her know that Theadore Nan CSW will be taking over her referral and that Amber would be contacting her to set an appointment   Follow Up Plan:  No further follow up planned at this time. The patient has been provided with needed resources.    [place], Care Management  [phone] [address], [place], [place] [address] Phone: [phone] Email: [email or web address]

## 2021-07-04 ENCOUNTER — Ambulatory Visit: Payer: Medicare Other | Admitting: Licensed Clinical Social Worker

## 2021-07-04 DIAGNOSIS — E785 Hyperlipidemia, unspecified: Secondary | ICD-10-CM

## 2021-07-04 DIAGNOSIS — Z8673 Personal history of transient ischemic attack (TIA), and cerebral infarction without residual deficits: Secondary | ICD-10-CM

## 2021-07-04 DIAGNOSIS — I10 Essential (primary) hypertension: Secondary | ICD-10-CM

## 2021-07-04 DIAGNOSIS — N1832 Chronic kidney disease, stage 3b: Secondary | ICD-10-CM

## 2021-07-04 NOTE — Chronic Care Management (AMB) (Signed)
Chronic Care Management    Clinical Social Work Note  07/04/2021 Name: Valerie Coleman MRN: [id number] DOB: 30-Aug-1960  Valerie Coleman is a 61 y.o. year old female who is a primary care patient of Loman Brooklyn, FNP. The CCM team was consulted to assist the patient with chronic disease management and/or care coordination needs related to: [employer] .   Engaged with patient by telephone for initial visit in response to provider referral for social work chronic care management and care coordination services.   Consent to Services:  The patient was given the following information about Chronic Care Management services today, agreed to services, and gave verbal consent: 1. CCM service includes personalized support from designated clinical staff supervised by the primary care provider, including individualized plan of care and coordination with other care providers 2. 24/7 contact phone numbers for assistance for urgent and routine care needs. 3. Service will only be billed when office clinical staff spend 20 minutes or more in a month to coordinate care. 4. Only one practitioner may furnish and bill the service in a calendar month. 5.The patient may stop CCM services at any time (effective at the end of the month) by phone call to the office staff. 6. The patient will be responsible for cost sharing (co-pay) of up to 20% of the service fee (after annual deductible is met). Patient agreed to services and consent obtained.  Patient agreed to services and consent obtained.   Assessment: Review of patient past medical history, allergies, medications, and health status, including review of relevant consultants reports was performed today as part of a comprehensive evaluation and provision of chronic care management and care coordination services.     SDOH (Social Determinants of Health) assessments and interventions performed:  SDOH Interventions    Flowsheet Row Most Recent Value  SDOH  Interventions   Physical Activity Interventions Other (Comments)  [client is legally blind. she uses a white cane to help her walk]  Stress Interventions Provide Counseling  [client has stress related related to managing medical needs]  Depression Interventions/Treatment  Counseling        Advanced Directives Status: See Vynca application for related entries.  CCM Care Plan  No Known Allergies  Outpatient Encounter Medications as of 07/04/2021  Medication Sig   atorvastatin (LIPITOR) 80 MG tablet Take 1 tablet (80 mg total) by mouth daily at 6 PM.   clopidogrel (PLAVIX) 75 MG tablet Take 1 tablet (75 mg total) by mouth daily.   dapagliflozin propanediol (FARXIGA) 10 MG TABS tablet Take 1 tablet (10 mg total) by mouth daily before breakfast.   hydrALAZINE (APRESOLINE) 10 MG tablet Take 1 tablet (10 mg total) by mouth 3 (three) times daily.   losartan (COZAAR) 100 MG tablet Take 1 tablet (100 mg total) by mouth daily.   REPATHA SURECLICK 491 MG/ML SOAJ Inject 140 mg into the skin every 14 (fourteen) days.   No facility-administered encounter medications on file as of 07/04/2021.    Patient Active Problem List   Diagnosis Date Noted   Intertriginous dermatitis associated with moisture 09/29/2020   Endometrial polyp 04/26/2020   Atypical glandular cells of undetermined significance (AGUS) on cervical Pap smear 04/12/2020   Right ovarian cyst 04/12/2020   Uterine leiomyoma 04/12/2020   Glaucoma of both eyes    Blind left eye    Essential hypertension    Hyperlipidemia    History of CVA (cerebrovascular accident) 05/16/2017   CKD (chronic kidney disease) stage 3, GFR 30-59 ml/min (  [place]) 01/04/2017   Central retinal vein occlusion     Conditions to be addressed/monitored: monitor client completion of daily tasks. Monitor client procurement of food needed  Care Plan : [place]  Updates made by Katha Cabal, LCSW since 07/04/2021 12:00 AM     Problem: Coping Skills  (General Plan of Care)      Goal: Coping Skills Enhanced. complete daily tasks as needed. procure food as needed   Start Date: 07/04/2021  Expected End Date: 09/29/2021  This Visit's Progress: On track  Priority: Medium  Note:   Current barriers:   Patient in need of assistance with connecting to community resources for food assistance Legally blind Mobility challenges Transport needs. Financial challenges  Clinical Goals:  Patient will contact SW in next 30 days to discuss coping skills of client and meeting everyday tasks to complete Patient will attend scheduled medical appointments in next 30 days Patient  will communicate with RNCM as needed in next 30 days for nursing support Patient will talk with SW in next 30 days about food needs of client  Clinical Interventions:  Collaboration with Loman Brooklyn, FNP regarding development and update of comprehensive plan of care as evidenced by provider attestation and co-signature Assessment of needs, barriers ,of client Completed PHQ 2/9. Completed GAD-7 Discussed transport needs of client. She uses [facility] as needed for transport help Discussed SW contact with Zannie Cove, [facility] and possible help for client with [facility].  Client said she had  worked with Thayer Headings previously.  Client is aksing for dots for her microwave and  asking for a talking clock. Discussed Meals on Wheels support for client through [employer]. Client is interested in Meals on Wheels support Discussed Frozen Meal support for client through [facility] insurance Reviewed family support. She has support from her daughter, neighbors, and from her friend, Wille Glaser.  Discussed apartment access with [name]. She said she has no problem accessing her apartment Discussed medication procurement of client.   Reviewed pain issues. She said she has some burning in her eyes and her eyes itch occasionally Reviewed sleeping issues of client and reviewed appetite of  client . Encouraged client to call RNCM as needed for nursing support for client Discussed blood pressure monitoring . She said she uses a blood pressure cuff  (talking cuff, which tells her blood pressure readings) Reviewed CCM program support for client Reviewed mood status. She said she thought her mood was stable. She does get a little concerned sometimes about her food supply. She gets a little concerned about her finances. She receives  Disability benefit each month Discussed support from her friend, Wille Glaser . She said Joe helps her with paying her bills and helps her with some needed activities. LCSW called Meals on Wheels representative at [place] and added client to Meals on Wheels waiting list LCSW called Lytle Michaels at [employer] [facility] in [place], [place] today and made referral to Lytle Michaels for client to receive support from [facility]. Lexine Baton said she would call client at [phone]  Patient Coping Skills Has support from her daughter, from her neighbors and from her friend, Wille Glaser Attends scheduled medical appointments  Patient Deficits Legally blind Food needs Mobility needs  Patient Goals:  In next 30 days, patient will:  Attend scheduled medical appointments Communicate as needed with RNCM for nursing support Communicate with LCSW as needed for SW support Talk with her daughter about client's needs  Follow Up Plan:  LCSW to call client on 08/26/21 at 3:30 PM to assess client needs    Norva Riffle.Amilcar Reever MSW, [place] [facility] representative [facility] Care Management [phone]

## 2021-07-04 NOTE — Patient Instructions (Addendum)
Visit Information  Patient goal:  Coping Skills Enhanced.  Manage daily tasks. Obtain needed food items  Time Frame: Short Term Goal Priority:  Medium Progress:  On Track  Start Date:  07/04/21 Expected End Date:  09/27/21  Follow up Date:  08/26/21 at 3:30 PM  Coping Skills Enhanced. Manage daily tasks. Obtain needed food  items    Patient Coping Skills Has support from her daughter, from her neighbors and from her friend, Wille Glaser Attends scheduled medical appointments  Patient Deficits Legally blind Food needs Mobility needs  Patient Goals:  In next 30 days, patient will:  Attend scheduled medical appointments Communicate as needed with RNCM for nursing support Communicate with LCSW as needed for SW support Talk with her daughter about client's needs  Follow Up Plan:  LCSW to call client on 08/26/21 at 3:30 PM to assess client needs   Norva Riffle.Russell Quinney MSW, [place] [facility] representative [facility] Care Management [phone]

## 2021-07-08 ENCOUNTER — Ambulatory Visit: Payer: Medicare Other | Admitting: *Deleted

## 2021-07-08 DIAGNOSIS — I10 Essential (primary) hypertension: Secondary | ICD-10-CM

## 2021-07-08 DIAGNOSIS — N1832 Chronic kidney disease, stage 3b: Secondary | ICD-10-CM

## 2021-07-08 NOTE — Chronic Care Management (AMB) (Signed)
Chronic Care Management   CCM RN Visit Note  07/08/2021 Name: Valerie Coleman MRN: [id number] DOB: 09/11/1960  Subjective: Valerie Coleman is a 61 y.o. year old female who is a primary care patient of Valerie Brooklyn, FNP. The care management team was consulted for assistance with disease management and care coordination needs.    Engaged with patient by telephone for follow up visit in response to provider referral for case management and/or care coordination services.   Consent to Services:  The patient was given information about Chronic Care Management services, agreed to services, and gave verbal consent prior to initiation of services.  Please see initial visit note for detailed documentation.   Patient agreed to services and verbal consent obtained.   Assessment: Review of patient past medical history, allergies, medications, health status, including review of consultants reports, laboratory and other test data, was performed as part of comprehensive evaluation and provision of chronic care management services.   SDOH (Social Determinants of Health) assessments and interventions performed:    CCM Care Plan  No Known Allergies  Outpatient Encounter Medications as of 07/08/2021  Medication Sig   atorvastatin (LIPITOR) 80 MG tablet Take 1 tablet (80 mg total) by mouth daily at 6 PM.   clopidogrel (PLAVIX) 75 MG tablet Take 1 tablet (75 mg total) by mouth daily.   dapagliflozin propanediol (FARXIGA) 10 MG TABS tablet Take 1 tablet (10 mg total) by mouth daily before breakfast.   hydrALAZINE (APRESOLINE) 10 MG tablet Take 1 tablet (10 mg total) by mouth 3 (three) times daily.   losartan (COZAAR) 100 MG tablet Take 1 tablet (100 mg total) by mouth daily.   REPATHA SURECLICK 485 MG/ML SOAJ Inject 140 mg into the skin every 14 (fourteen) days.   No facility-administered encounter medications on file as of 07/08/2021.    Patient Active Problem List   Diagnosis Date Noted    Intertriginous dermatitis associated with moisture 09/29/2020   Endometrial polyp 04/26/2020   Atypical glandular cells of undetermined significance (AGUS) on cervical Pap smear 04/12/2020   Right ovarian cyst 04/12/2020   Uterine leiomyoma 04/12/2020   Glaucoma of both eyes    Blind left eye    Essential hypertension    Hyperlipidemia    History of CVA (cerebrovascular accident) 05/16/2017   CKD (chronic kidney disease) stage 3, GFR 30-59 ml/min ([place]) 01/04/2017   Central retinal vein occlusion     Conditions to be addressed/monitored:HTN and blindness  Care Plan : [facility] Care Plan  Updates made by Ilean China, RN since 07/08/2021 12:00 AM     Problem: Chronic Disease Management Needs   Priority: High  Onset Date: 06/23/2021     Long-Range Goal: Patient will Work with Advertising account planner to Develop a Plan of Care Regarding Care Management and [place] with uncontrolled HTN, blindness, food insecurity   Start Date: 06/23/2021  Expected End Date: 06/23/2022  This Visit's Progress: Not on track  Recent Progress: Not on track  Priority: High  Note:   Current Barriers:  Knowledge Deficits related to plan of care for management of HTN  Chronic Disease Management support and education needs related to HTN Financial Constraints.   RNCM Clinical Goal(s):  Patient will verbalize understanding of plan for management of HTN as evidenced by checking and recording blood pressure daily ad by calling PCP with any readings outside of recommended range and by taking medication as prescribed take all medications exactly as prescribed and will call provider for medication  related questions as evidenced by documentation in EMR    demonstrate improved adherence to prescribed treatment plan for HTN as evidenced by medication and assessment compliance and improvement in blood pressure readings continue to work with Consulting civil engineer and/or Social Worker to address care management and  care coordination needs related to HTN as evidenced by adherence to CM Team Scheduled appointments     through collaboration with LCSW, provider, and care team.   Interventions: 1:1 collaboration with primary care provider regarding development and update of comprehensive plan of care as evidenced by provider attestation and co-signature Inter-disciplinary care team collaboration (see longitudinal plan of care) Evaluation of current treatment plan related to  self management and patient's adherence to plan as established by provider   SDOH Barriers (Status: Goal Met.) Short Term Goal        Patient interviewed and appropriate assessments performed Referred patient to community resources care guide team for assistance with food resources Patient has been outreached by [employer] care guide team and LCSW and has been provided with necessary resource information    Hypertension: (Status: Goal on track: NO.) Long Term Goal  Last practice recorded BP readings:  BP Readings from Last 3 Encounters:  06/17/21 (!) 208/112  10/05/20 (!) 179/99  09/29/20 (!) 217/110  Most recent eGFR/CrCl:  Lab Results  Component Value Date   EGFR 39 (L) 06/17/2021    No components found for: CRCL  Evaluation of current treatment plan related to hypertension self management and patient's adherence to plan as established by provider;   Reviewed medications with patient and discussed importance of compliance;  Discussed plans with patient for ongoing care management follow up and provided patient with direct contact information for care management team; Advised patient, providing education and rationale, to monitor blood pressure daily and record, calling PCP for findings outside established parameters;  Discussed complications of poorly controlled blood pressure such as heart disease, stroke, circulatory complications, vision complications, kidney impairment, sexual dysfunction;  Assessed social  determinant of health barriers;  Advised of need for follow-up with PCP and offered to assist in scheduling. Patient declined at this time stating that she has too many appointments right now but agreed to call and schedule a follow-up as soon as possible. Transportation is not a barrier.  Discussed blindness as a barrier. She does have a blood pressure monitor that reads the blood pressure results out loud but she doesn't have a way to record them. She hasn't checked her blood pressure since last week but reports that it is better. Averaging about 150/90 or below and that she no longer has a headache. Encouraged to check blood pressure at least 3 times per week. She is going to ask her neighbor for assistance in writing down the readings to bring to her PCP visit because she isn't sure if the machine stores the readings.  RNCM to collaborate with LCSW regarding other resources that might be helpful, like a tape recorder. She could dictate the readings and bring that to her PCP visit. She doesn't have a smartphone or other way or recording the results.    Patient Goals/Self-Care Activities: Take medications as prescribed   Attend all scheduled provider appointments Call provider office for new concerns or questions  check blood pressure 3 times per week write blood pressure results in a log or diary take blood pressure log to all doctor appointments eat more whole grains, fruits and vegetables, lean meats and healthy fats Limit salt/sodium intake Ask  neighbor/friend for assistance in writing down blood pressure readings in your log to bring to your office visit Call [phone] to schedule an appointment with your PCP  Call RN Care Manager as needed [phone]  Plan:Telephone follow up appointment with care management team member scheduled for:  07/22/21 with RNCM The patient has been provided with contact information for the care management team and has been advised to call with any health  related questions or concerns.   Chong Sicilian, BSN, RN-BC Embedded Chronic Care Manager [facility] [place] [facility] Direct Dial: [phone]

## 2021-07-08 NOTE — Patient Instructions (Signed)
Visit Information  Patient Goals/Self-Care Activities: Take medications as prescribed   Attend all scheduled provider appointments Call provider office for new concerns or questions  check blood pressure 3 times per week write blood pressure results in a log or diary take blood pressure log to all doctor appointments eat more whole grains, fruits and vegetables, lean meats and healthy fats Limit salt/sodium intake Ask neighbor/friend for assistance in writing down blood pressure readings in your log to bring to your office visit Call [phone] to schedule an appointment with your PCP  Call RN Care Manager as needed [phone]  The patient verbalized understanding of instructions, educational materials, and care plan provided today and declined offer to receive copy of patient instructions, educational materials, and care plan.   Plan:Telephone follow up appointment with care management team member scheduled for:  07/22/21 with RNCM The patient has been provided with contact information for the care management team and has been advised to call with any health related questions or concerns.   Chong Sicilian, BSN, RN-BC Embedded Chronic Care Manager [facility] [place] [facility] Direct Dial: [phone]

## 2021-07-12 DIAGNOSIS — I10 Essential (primary) hypertension: Secondary | ICD-10-CM

## 2021-07-12 DIAGNOSIS — E785 Hyperlipidemia, unspecified: Secondary | ICD-10-CM

## 2021-07-22 ENCOUNTER — Ambulatory Visit (INDEPENDENT_AMBULATORY_CARE_PROVIDER_SITE_OTHER): Payer: Medicare Other | Admitting: *Deleted

## 2021-07-22 DIAGNOSIS — I10 Essential (primary) hypertension: Secondary | ICD-10-CM

## 2021-07-22 DIAGNOSIS — H409 Unspecified glaucoma: Secondary | ICD-10-CM

## 2021-07-22 DIAGNOSIS — N1832 Chronic kidney disease, stage 3b: Secondary | ICD-10-CM

## 2021-07-22 NOTE — Chronic Care Management (AMB) (Signed)
Chronic Care Management   CCM RN Visit Note  07/22/2021 Name: Valerie Coleman MRN: [id number] DOB: 1960/10/12  Subjective: Valerie Coleman is a 61 y.o. year old female who is a primary care patient of Loman Brooklyn, FNP. The care management team was consulted for assistance with disease management and care coordination needs.    Engaged with patient by telephone for follow up visit in response to provider referral for case management and/or care coordination services.   Consent to Services:  The patient was given information about Chronic Care Management services, agreed to services, and gave verbal consent prior to initiation of services.  Please see initial visit note for detailed documentation.   Patient agreed to services and verbal consent obtained.   Assessment: Review of patient past medical history, allergies, medications, health status, including review of consultants reports, laboratory and other test data, was performed as part of comprehensive evaluation and provision of chronic care management services.   SDOH (Social Determinants of Health) assessments and interventions performed:    CCM Care Plan  No Known Allergies  Outpatient Encounter Medications as of 07/22/2021  Medication Sig   atorvastatin (LIPITOR) 80 MG tablet Take 1 tablet (80 mg total) by mouth daily at 6 PM.   clopidogrel (PLAVIX) 75 MG tablet Take 1 tablet (75 mg total) by mouth daily.   dapagliflozin propanediol (FARXIGA) 10 MG TABS tablet Take 1 tablet (10 mg total) by mouth daily before breakfast.   hydrALAZINE (APRESOLINE) 10 MG tablet Take 1 tablet (10 mg total) by mouth 3 (three) times daily.   losartan (COZAAR) 100 MG tablet Take 1 tablet (100 mg total) by mouth daily.   REPATHA SURECLICK 829 MG/ML SOAJ Inject 140 mg into the skin every 14 (fourteen) days.   No facility-administered encounter medications on file as of 07/22/2021.    Patient Active Problem List   Diagnosis Date Noted    Intertriginous dermatitis associated with moisture 09/29/2020   Endometrial polyp 04/26/2020   Atypical glandular cells of undetermined significance (AGUS) on cervical Pap smear 04/12/2020   Right ovarian cyst 04/12/2020   Uterine leiomyoma 04/12/2020   Glaucoma of both eyes    Blind left eye    Essential hypertension    Hyperlipidemia    History of CVA (cerebrovascular accident) 05/16/2017   CKD (chronic kidney disease) stage 3, GFR 30-59 ml/min ([place]) 01/04/2017   Central retinal vein occlusion     Conditions to be addressed/monitored:HTN and blindness  Care Plan : [facility] Care Plan  Updates made by Ilean China, RN since 07/22/2021 12:00 AM     Problem: Chronic Disease Management Needs   Priority: High  Onset Date: 06/23/2021     Long-Range Goal: Patient will Work with Advertising account planner to Develop a Plan of [place] with uncontrolled HTN, CKD, blindness due to glaucoma, food insecurity   Start Date: 06/23/2021  Expected End Date: 06/23/2022  This Visit's Progress: On track  Recent Progress: Not on track  Priority: High  Note:   Current Barriers:  Knowledge Deficits related to plan of care for management of HTN  Chronic Disease Management support and education needs related to HTN Financial Constraints.   RNCM Clinical Goal(s):  Patient will verbalize understanding of plan for management of HTN as evidenced by checking and recording blood pressure daily ad by calling PCP with any readings outside of recommended range and by taking medication as prescribed take all medications exactly as prescribed and will call  provider for medication related questions as evidenced by documentation in EMR    demonstrate improved adherence to prescribed treatment plan for HTN as evidenced by medication and assessment compliance and improvement in blood pressure readings continue to work with Consulting civil engineer and/or Social Worker to address care  management and care coordination needs related to HTN as evidenced by adherence to CM Team Scheduled appointments     through collaboration with LCSW, provider, and care team.   Interventions: 1:1 collaboration with primary care provider regarding development and update of comprehensive plan of care as evidenced by provider attestation and co-signature Inter-disciplinary care team collaboration (see longitudinal plan of care) Evaluation of current treatment plan related to  self management and patient's adherence to plan as established by provider   SDOH Barriers (Status: Goal Met.) Short Term Goal        Patient interviewed and appropriate assessments performed Referred patient to community resources care guide team for assistance with food resources Patient has been outreached by [employer] care guide team and LCSW and has been provided with necessary resource information    Hypertension: (Status: Goal on track: NO.) Long Term Goal  Last practice recorded BP readings:  BP Readings from Last 3 Encounters:  06/17/21 (!) 208/112  10/05/20 (!) 179/99  09/29/20 (!) 217/110  Evaluation of current treatment plan related to hypertension self management and patient's adherence to plan as established by provider;   Reviewed medications with patient and discussed importance of compliance;  Discussed plans with patient for ongoing care management follow up and provided patient with direct contact information for care management team; Advised patient, providing education and rationale, to monitor blood pressure daily and record, calling PCP for findings outside established parameters;  Assessed social determinant of health barriers;  Previously advised of need for follow-up with PCP and offered to assist in scheduling. Patient declined at this time stating that she has too many appointments right now but agreed to call and schedule a follow-up as soon as possible. Transportation is not a  barrier.  Previously discussed blindness as a barrier. She does have a blood pressure monitor that reads the blood pressure results out loud but she doesn't have a way to record them. She hasn't checked her blood pressure since last week but reports that it is better. Averaging about 150/90 or below and that she no longer has a headache. Encouraged to check blood pressure at least 3 times per week. She is asking her neighbor for assistance in writing down the readings to bring to her PCP visit because she isn't sure if the machine stores the readings.    Patient Goals/Self-Care Activities: Take medications as prescribed   Attend all scheduled provider appointments Call provider office for new concerns or questions  check blood pressure 3 times per week write blood pressure results in a log or diary take blood pressure log to all doctor appointments eat more whole grains, fruits and vegetables, lean meats and healthy fats Limit salt/sodium intake Ask neighbor/friend for assistance in writing down blood pressure readings in your log to bring to your office visit Call [phone] to schedule an appointment with your PCP  Call RN Care Manager as needed [phone]  Plan:Telephone follow up appointment with care management team member scheduled for:  08/16/21 with RNCM The patient has been provided with contact information for the care management team and has been advised to call with any health related questions or concerns.   Chong Sicilian, BSN, RN-BC Embedded  Chronic Care Manager [place] / [place] [facility] Direct Dial: [phone]

## 2021-07-22 NOTE — Patient Instructions (Signed)
Visit Information  Patient Goals/Self-Care Activities: Take medications as prescribed   Attend all scheduled provider appointments Call provider office for new concerns or questions  check blood pressure 3 times per week write blood pressure results in a log or diary take blood pressure log to all doctor appointments eat more whole grains, fruits and vegetables, lean meats and healthy fats Limit salt/sodium intake Ask neighbor/friend for assistance in writing down blood pressure readings in your log to bring to your office visit Call [phone] to schedule an appointment with your PCP  Call RN Care Manager as needed [phone]  The patient verbalized understanding of instructions, educational materials, and care plan provided today and declined offer to receive copy of patient instructions, educational materials, and care plan.   Plan:Telephone follow up appointment with care management team member scheduled for:  08/16/21 with RNCM The patient has been provided with contact information for the care management team and has been advised to call with any health related questions or concerns.   Chong Sicilian, BSN, RN-BC Embedded Chronic Care Manager [facility] / [place] [facility] Direct Dial: [phone]

## 2021-08-16 ENCOUNTER — Ambulatory Visit (INDEPENDENT_AMBULATORY_CARE_PROVIDER_SITE_OTHER): Payer: Medicare Other | Admitting: *Deleted

## 2021-08-16 DIAGNOSIS — H409 Unspecified glaucoma: Secondary | ICD-10-CM

## 2021-08-16 DIAGNOSIS — I10 Essential (primary) hypertension: Secondary | ICD-10-CM

## 2021-08-16 DIAGNOSIS — N1832 Chronic kidney disease, stage 3b: Secondary | ICD-10-CM

## 2021-08-16 NOTE — Patient Instructions (Signed)
Visit Information ? ?Patient Goals/Self-Care Activities: ?Take medications as prescribed   ?Attend all scheduled provider appointments ?Call provider office for new concerns or questions  ?check blood pressure 3 times per week ?write blood pressure results in a log or diary ?take blood pressure log to all doctor appointments ?eat more whole grains, fruits and vegetables, lean meats and healthy fats ?Limit salt/sodium intake ?Ask neighbor/friend for assistance in writing down blood pressure readings in your log to bring to your office visit ?Call [phone] to schedule an appointment with your PCP  ?Call RN Care Manager as needed [phone] ? ?The patient verbalized understanding of instructions, educational materials, and care plan provided today and declined offer to receive copy of patient instructions, educational materials, and care plan.  ? ?Plan:Telephone follow up appointment with care management team member scheduled for:  09/16/21 with RNCM ?The patient has been provided with contact information for the care management team and has been advised to call with any health related questions or concerns.  ? ?Chong Sicilian, BSN, RN-BC ?Embedded Chronic Care Manager ?[place] / [place] Management ?Direct Dial: [phone] ?  ?

## 2021-08-16 NOTE — Chronic Care Management (AMB) (Signed)
?Chronic Care Management  ? ?CCM RN Visit Note ? ?08/16/2021 ?Name: Valerie Coleman MRN: [id number] DOB: 04/25/1961 ? ?Subjective: ?Valerie Coleman is a 61 y.o. year old female who is a primary care patient of Loman Brooklyn, FNP. The care management team was consulted for assistance with disease management and care coordination needs.   ? ?Engaged with patient by telephone for follow up visit in response to provider referral for case management and/or care coordination services.  ? ?Consent to Services:  ?The patient was given information about Chronic Care Management services, agreed to services, and gave verbal consent prior to initiation of services.  Please see initial visit note for detailed documentation.  ? ?Patient agreed to services and verbal consent obtained.  ? ?Assessment: Review of patient past medical history, allergies, medications, health status, including review of consultants reports, laboratory and other test data, was performed as part of comprehensive evaluation and provision of chronic care management services.  ? ?SDOH (Social Determinants of Health) assessments and interventions performed:   ? ?CCM Care Plan ? ?No Known Allergies ? ?Outpatient Encounter Medications as of 08/16/2021  ?Medication Sig  ? atorvastatin (LIPITOR) 80 MG tablet Take 1 tablet (80 mg total) by mouth daily at 6 PM.  ? clopidogrel (PLAVIX) 75 MG tablet Take 1 tablet (75 mg total) by mouth daily.  ? dapagliflozin propanediol (FARXIGA) 10 MG TABS tablet Take 1 tablet (10 mg total) by mouth daily before breakfast.  ? hydrALAZINE (APRESOLINE) 10 MG tablet Take 1 tablet (10 mg total) by mouth 3 (three) times daily.  ? losartan (COZAAR) 100 MG tablet Take 1 tablet (100 mg total) by mouth daily.  ? REPATHA SURECLICK 197 MG/ML SOAJ Inject 140 mg into the skin every 14 (fourteen) days.  ? ?No facility-administered encounter medications on file as of 08/16/2021.  ? ? ?Patient Active Problem List  ? Diagnosis Date Noted  ? Intertriginous  dermatitis associated with moisture 09/29/2020  ? Endometrial polyp 04/26/2020  ? Atypical glandular cells of undetermined significance (AGUS) on cervical Pap smear 04/12/2020  ? Right ovarian cyst 04/12/2020  ? Uterine leiomyoma 04/12/2020  ? Glaucoma of both eyes   ? Blind left eye   ? Essential hypertension   ? Hyperlipidemia   ? History of CVA (cerebrovascular accident) 05/16/2017  ? CKD (chronic kidney disease) stage 3, GFR 30-59 ml/min ([facility]) 01/04/2017  ? Central retinal vein occlusion   ? ? ?Conditions to be addressed/monitored:HTN and glaucoma ? ?Care Plan : [facility] Care Plan  ?Updates made by Ilean China, RN since 08/16/2021 12:00 AM  ?  ? ?Problem: Chronic Disease Management Needs   ?Priority: High  ?Onset Date: 06/23/2021  ?  ? ?Long-Range Goal: Patient will Work with Consulting civil engineer to Develop a Plan of [place] with HTN, CKD, blindness due to glaucoma, food insecurity   ?Start Date: 06/23/2021  ?Expected End Date: 06/23/2022  ?Recent Progress: On track  ?Priority: High  ?Note:   ?Current Barriers:  ?Knowledge Deficits related to plan of care for management of HTN  ?Chronic Disease Management support and education needs related to HTN ?Film/video editor.  ? ?RNCM Clinical Goal(s):  ?Patient will verbalize understanding of plan for management of HTN as evidenced by checking and recording blood pressure daily ad by calling PCP with any readings outside of recommended range and by taking medication as prescribed ?take all medications exactly as prescribed and will call provider for medication related questions as evidenced  by documentation in EMR    ?demonstrate improved adherence to prescribed treatment plan for HTN as evidenced by medication and assessment compliance and improvement in blood pressure readings ?continue to work with Consulting civil engineer and/or Social Worker to address care management and care coordination needs related to HTN as evidenced  by adherence to CM Team Scheduled appointments     through collaboration with LCSW, provider, and care team.  ? ?Interventions: ?1:1 collaboration with primary care provider regarding development and update of comprehensive plan of care as evidenced by provider attestation and co-signature ?Inter-disciplinary care team collaboration (see longitudinal plan of care) ?Evaluation of current treatment plan related to  self management and patient's adherence to plan as established by provider ? ? ?SDOH Barriers (Status: Goal Met.) Short Term Goal  ?      ?Patient interviewed and appropriate assessments performed ?Referred patient to community resources care guide team for assistance with food resources ?Patient has been outreached by [employer] and has been provided with necessary resource information ?  ? ?Hypertension: (Status: Goal on track: NO.) Long Term Goal  ?Last practice recorded BP readings:  ?BP Readings from Last 3 Encounters:  ?06/17/21 (!) 208/112  ?10/05/20 (!) 179/99  ?09/29/20 (!) 217/110  ?Evaluation of current treatment plan related to hypertension self management and patient's adherence to plan as established by provider;   ?Reviewed medications with patient and discussed importance of compliance;  ?Discussed plans with patient for ongoing care management follow up and provided patient with direct contact information for care management team; ?Advised patient, providing education and rationale, to monitor blood pressure daily and record, calling PCP for findings outside established parameters;  ?Assessed social determinant of health barriers;  ?Advised of need for follow-up with PCP and offered to assist in scheduling. Patient declined at this time but agreed to call and schedule a follow-up as soon as possible. Transportation is not a barrier.  ?Discussed blindness as a barrier. She does have a blood pressure monitor that reads the blood pressure results out loud but  she doesn't have a way to record them. She hasn't checked her blood pressure recently but reports feeling well and denies any hypertensive symptoms. Patient baseline BP is elevated, so she may not have symptoms unless very high. Encouraged to check blood pressure at least 3 times per week. She is asking her neighbor for assistance in writing down the readings to bring to her PCP visit because she isn't sure if the machine stores the readings.  ? ? ?Patient Goals/Self-Care Activities: ?Take medications as prescribed   ?Attend all scheduled provider appointments ?Call provider office for new concerns or questions  ?check blood pressure 3 times per week ?write blood pressure results in a log or diary ?take blood pressure log to all doctor appointments ?eat more whole grains, fruits and vegetables, lean meats and healthy fats ?Limit salt/sodium intake ?Ask neighbor/friend for assistance in writing down blood pressure readings in your log to bring to your office visit ?Call [phone] to schedule an appointment with your PCP  ?Call RN Care Manager as needed [phone] ? ? ?Plan:Telephone follow up appointment with care management team member scheduled for:  09/16/21 with RNCM ?The patient has been provided with contact information for the care management team and has been advised to call with any health related questions or concerns.  ? ?Chong Sicilian, BSN, RN-BC ?Embedded Chronic Care Manager ?[place] / [place] Management ?Direct Dial: [phone] ? ?  ? ? ? ? ? ? ? ? ? ? ? ?

## 2021-08-25 ENCOUNTER — Ambulatory Visit: Payer: Medicare Other | Admitting: Licensed Clinical Social Worker

## 2021-08-25 NOTE — Patient Instructions (Addendum)
Visit Information ? ?Patient Goals:   Coping Skills Enhanced.Manage  daily tasks. Obtain  needed food items ? ?Time Frame: Short Term Goal ?Priority:  Medium ?Progress:  On Track ? ?Start Date:  07/04/21 ?Expected End Date:  11/24/21   ? ?Follow up Date:  10/18/21 at 11:00 AM ? ?Coping Skills Enhanced. Manage daily tasks. Obtain needed food  items ?   ?Patient Coping Skills ?Has support from her daughter, from her neighbors and from her friend, Wille Glaser ?Attends scheduled medical appointments ? ?Patient Deficits ?Legally blind ?Food needs ?Mobility needs ? ?Patient Goals:  In next 30 days, patient will: ? ?Attend scheduled medical appointments ?Communicate as needed with RNCM for nursing support ?Communicate with LCSW as needed for SW support ?Talk with her daughter about client's needs ? ?Follow Up Plan:  LCSW to call client on 10/18/21 at 11:00 AM to assess client needs  ? ?Norva Riffle.Mitsuo Budnick MSW, LCSW ?Licensed Clinical Social Worker ?[place] Management ?[phone] ?

## 2021-08-25 NOTE — Chronic Care Management (AMB) (Signed)
?Chronic Care Management  ? ? Clinical Social Work Note ? ?08/25/2021 ?Name: Valerie Coleman MRN: [id number] DOB: 03/25/1961 ? ?Valerie Coleman is a 61 y.o. year old female who is a primary care patient of Loman Brooklyn, FNP. The CCM team was consulted to assist the patient with chronic disease management and/or care coordination needs related to: [employer] .  ? ?Engaged with patient by telephone for follow up visit in response to provider referral for social work chronic care management and care coordination services.  ? ?Consent to Services:  ?The patient was given information about Chronic Care Management services, agreed to services, and gave verbal consent prior to initiation of services.  Please see initial visit note for detailed documentation.  ? ?Patient agreed to services and consent obtained.  ? ?Assessment: Review of patient past medical history, allergies, medications, and health status, including review of relevant consultants reports was performed today as part of a comprehensive evaluation and provision of chronic care management and care coordination services.    ? ?SDOH (Social Determinants of Health) assessments and interventions performed:  ?SDOH Interventions   ? ?Flowsheet Row Most Recent Value  ?SDOH Interventions   ?Food Insecurity Interventions Other (Comment)  [client is concerned over food scarcity from time to time]  ?Stress Interventions Provide Counseling  [client has stress related to managing medical needs]  ?Depression Interventions/Treatment  Counseling  ? ?  ?  ? ?Advanced Directives Status: See Vynca application for related entries. ? ?CCM Care Plan ? ?No Known Allergies ? ?Outpatient Encounter Medications as of 08/25/2021  ?Medication Sig  ? atorvastatin (LIPITOR) 80 MG tablet Take 1 tablet (80 mg total) by mouth daily at 6 PM.  ? clopidogrel (PLAVIX) 75 MG tablet Take 1 tablet (75 mg total) by mouth daily.  ? dapagliflozin propanediol (FARXIGA) 10 MG TABS tablet Take 1  tablet (10 mg total) by mouth daily before breakfast.  ? hydrALAZINE (APRESOLINE) 10 MG tablet Take 1 tablet (10 mg total) by mouth 3 (three) times daily.  ? losartan (COZAAR) 100 MG tablet Take 1 tablet (100 mg total) by mouth daily.  ? REPATHA SURECLICK 413 MG/ML SOAJ Inject 140 mg into the skin every 14 (fourteen) days.  ? ?No facility-administered encounter medications on file as of 08/25/2021.  ? ? ?Patient Active Problem List  ? Diagnosis Date Noted  ? Intertriginous dermatitis associated with moisture 09/29/2020  ? Endometrial polyp 04/26/2020  ? Atypical glandular cells of undetermined significance (AGUS) on cervical Pap smear 04/12/2020  ? Right ovarian cyst 04/12/2020  ? Uterine leiomyoma 04/12/2020  ? Glaucoma of both eyes   ? Blind left eye   ? Essential hypertension   ? Hyperlipidemia   ? History of CVA (cerebrovascular accident) 05/16/2017  ? CKD (chronic kidney disease) stage 3, GFR 30-59 ml/min (HCC) 01/04/2017  ? Central retinal vein occlusion   ? ? ?Conditions to be addressed/monitored: monitor client completion of daily tasks; monitor client food procurement ? ?Care Plan : LCSW Care Plan  ?Updates made by Katha Cabal, LCSW since 08/25/2021 12:00 AM  ?  ? ?Problem: Coping Skills (General Plan of Care)   ?  ? ?Goal: Coping Skills Enhanced. complete daily tasks as needed. procure food as needed   ?Start Date: 08/25/2021  ?Expected End Date: 11/24/2021  ?This Visit's Progress: On track  ?Recent Progress: On track  ?Priority: Medium  ?Note:   ?Current barriers:   ?Patient in need of assistance with connecting to community resources for food assistance ?  Legally blind ?Mobility challenges ?Transport needs. ?Financial challenges ? ?Clinical Goals:  ?Patient will contact SW in next 30 days to discuss coping skills of client and meeting everyday tasks to complete ?Patient will attend scheduled medical appointments in next 30 days ?Patient  will communicate with RNCM as needed in next 30 days for nursing  support ?Patient will talk with SW in next 30 days about food needs of client ? ?Clinical Interventions:  ?Collaboration with Loman Brooklyn, FNP regarding development and update of comprehensive plan of care as evidenced by provider attestation and co-signature ?Assessment of needs, barriers ,of client ?Discussed client needs with Luellen Pucker.   Discussed client support with Lytle Michaels from [facility].Nakyla said that Lytle Michaels had visited her two times in the home. Lexine Baton brought client a magnifier,dots for her microwave,and a  calendar. Lexine Baton talked with client about client's vision needs. Client is appreciative of help she has received from [facility]. ?Discussed Meals on Wheels support for client through [place]. Client is now on Meals on Wheels waiting list.  ?Reviewed family support. She has support from her daughter, neighbors, and from her friend, Wille Glaser. Her daughter often helps client with food needs of client ?Reviewed pain issues of client.  Reviewed ADLs completion of client. Client said she has no problem with completing ADLs. ?Encouraged client to call RNCM as needed for nursing support for client ?Encouraged client to call LCSW as needed for SW support ?Reviewed mood status of client. She said she thought her mood was stable. She does get a little concerned sometimes about her food supply. She gets a little concerned about her finances. She has support from her daughter and from her friend, Wille Glaser. ?Provided counseling support for client ?Encouraged client to call [facility] Triage Nurse as needed to discuss nursing needs of client ? ?Patient Coping Skills ?Has support from her daughter, from her neighbors and from her friend, Wille Glaser ?Attends scheduled medical appointments ? ?Patient Deficits ?Legally blind ?Food needs ?Mobility needs ? ?Patient Goals:  In next 30 days, patient will: ? ?Attend scheduled medical appointments ?Communicate as needed with RNCM for nursing support ?Communicate with  LCSW as needed for SW support ?Talk with her daughter about client's needs ? ?Follow Up Plan:  LCSW to call client on 10/18/21 at 11:00 AM to assess client needs  ?  ?  ?Norva Riffle.Nikeya Maxim MSW, LCSW ?Licensed Clinical Social Worker ?[place] [facility] ?[phone] ?

## 2021-09-05 ENCOUNTER — Telehealth: Payer: Self-pay | Admitting: Family Medicine

## 2021-09-05 DIAGNOSIS — I10 Essential (primary) hypertension: Secondary | ICD-10-CM

## 2021-09-05 MED ORDER — LOSARTAN POTASSIUM 100 MG PO TABS: 100.0000 mg | ORAL_TABLET | Freq: Every day | ORAL | 0 refills | Status: DC

## 2021-09-05 NOTE — Telephone Encounter (Signed)
?  Prescription Request ? ?09/05/2021 ? ?Is this a "Controlled Substance" medicine? no ? ?Have you seen your PCP in the last 2 weeks? No,pt has appt on 09/13/2021 but is about to run out of medication ? ?If YES, route message to pool  -  If NO, patient needs to be scheduled for appointment. ? ?What is the name of the medication or equipment? losartan (COZAAR) 100 MG tablet  ? ?Have you contacted your pharmacy to request a refill? No, pt is switching pharmacies  ? ?Which pharmacy would you like this sent to? [facility] ? ? ?Patient notified that their request is being sent to the clinical staff for review and that they should receive a response within 2 business days.  ? ? ?

## 2021-09-06 NOTE — Telephone Encounter (Signed)
Patient is using neighbors phone to check on Losartan 100 mg. Patient is out. Call Pricilla Handler [phone] and let him know when medication is called in so he can tell patient since her phone is out. ?

## 2021-09-06 NOTE — Telephone Encounter (Signed)
Aware medication has been sent to pharmacy 

## 2021-09-13 ENCOUNTER — Ambulatory Visit (INDEPENDENT_AMBULATORY_CARE_PROVIDER_SITE_OTHER): Payer: Medicare Other | Admitting: *Deleted

## 2021-09-13 ENCOUNTER — Encounter: Payer: Self-pay | Admitting: *Deleted

## 2021-09-13 ENCOUNTER — Telehealth: Payer: Self-pay | Admitting: *Deleted

## 2021-09-13 ENCOUNTER — Ambulatory Visit: Payer: Medicare Other | Admitting: Family Medicine

## 2021-09-13 DIAGNOSIS — H409 Unspecified glaucoma: Secondary | ICD-10-CM

## 2021-09-13 DIAGNOSIS — I10 Essential (primary) hypertension: Secondary | ICD-10-CM

## 2021-09-13 MED ORDER — NYSTATIN 100000 UNIT/GM EX POWD: 1.0000 "application " | Freq: Three times a day (TID) | CUTANEOUS | 0 refills | Status: DC

## 2021-09-13 NOTE — Telephone Encounter (Signed)
09/13/2021 ? ?Patient requesting refill of nystatin powders to treat cutaneous yeast infection in groin. This has worked well in the past. She prefers [facility] so that they can deliver the medicine to her.  ? ?Forwarding to PCP for review and order if appropriate. ? ?Chong Sicilian, BSN, RN-BC ?Embedded Chronic Care Manager ?[place] [facility] [place] [facility] ?Direct Dial: [phone] ? ?

## 2021-09-13 NOTE — Telephone Encounter (Signed)
Rx sent 

## 2021-09-13 NOTE — Telephone Encounter (Signed)
No vm

## 2021-09-15 ENCOUNTER — Ambulatory Visit: Payer: Medicare Other | Admitting: Family Medicine

## 2021-09-15 ENCOUNTER — Telehealth: Payer: Medicare Other

## 2021-09-16 ENCOUNTER — Telehealth: Payer: Medicare Other

## 2021-09-26 ENCOUNTER — Telehealth: Payer: Self-pay | Admitting: Family Medicine

## 2021-09-26 DIAGNOSIS — I10 Essential (primary) hypertension: Secondary | ICD-10-CM

## 2021-09-26 DIAGNOSIS — Z8673 Personal history of transient ischemic attack (TIA), and cerebral infarction without residual deficits: Secondary | ICD-10-CM

## 2021-09-26 MED ORDER — HYDRALAZINE HCL 10 MG PO TABS: 10.0000 mg | ORAL_TABLET | Freq: Three times a day (TID) | ORAL | 0 refills | Status: DC

## 2021-09-26 MED ORDER — CLOPIDOGREL BISULFATE 75 MG PO TABS: 75.0000 mg | ORAL_TABLET | Freq: Every day | ORAL | 1 refills | Status: DC

## 2021-09-26 NOTE — Telephone Encounter (Signed)
30 day supply sent

## 2021-09-26 NOTE — Telephone Encounter (Signed)
?  Prescription Request ? ?09/26/2021 ? ?Is this a "Controlled Substance" medicine? no ? ?Have you seen your PCP in the last 2 weeks? No, Pt has appt on 10/05/2021 ? ?If YES, route message to pool  -  If NO, patient needs to be scheduled for appointment. ? ?What is the name of the medication or equipment? clopidogrel (PLAVIX) 75 MG tablet ?hydrALAZINE (APRESOLINE) 10 MG tablet  ?Have you contacted your pharmacy to request a refill? yes  ? ?Which pharmacy would you like this sent to? [facility] ? ? ?Patient notified that their request is being sent to the clinical staff for review and that they should receive a response within 2 business days.  ? ? ?

## 2021-10-05 ENCOUNTER — Telehealth: Payer: Self-pay | Admitting: Family Medicine

## 2021-10-05 ENCOUNTER — Encounter: Payer: Self-pay | Admitting: Family Medicine

## 2021-10-05 ENCOUNTER — Ambulatory Visit (INDEPENDENT_AMBULATORY_CARE_PROVIDER_SITE_OTHER): Payer: Medicare Other | Admitting: Family Medicine

## 2021-10-05 VITALS — BP 195/108 | HR 72 | Ht 73.0 in | Wt 197.0 lb

## 2021-10-05 DIAGNOSIS — H348192 Central retinal vein occlusion, unspecified eye, stable: Secondary | ICD-10-CM

## 2021-10-05 DIAGNOSIS — E782 Mixed hyperlipidemia: Secondary | ICD-10-CM

## 2021-10-05 DIAGNOSIS — N1832 Chronic kidney disease, stage 3b: Secondary | ICD-10-CM

## 2021-10-05 DIAGNOSIS — Z1159 Encounter for screening for other viral diseases: Secondary | ICD-10-CM

## 2021-10-05 DIAGNOSIS — E785 Hyperlipidemia, unspecified: Secondary | ICD-10-CM

## 2021-10-05 DIAGNOSIS — I10 Essential (primary) hypertension: Secondary | ICD-10-CM | POA: Diagnosis not present

## 2021-10-05 DIAGNOSIS — Z8673 Personal history of transient ischemic attack (TIA), and cerebral infarction without residual deficits: Secondary | ICD-10-CM

## 2021-10-05 MED ORDER — HYDRALAZINE HCL 10 MG PO TABS: 10.0000 mg | ORAL_TABLET | Freq: Three times a day (TID) | ORAL | 1 refills | Status: DC

## 2021-10-05 MED ORDER — REPATHA SURECLICK 140 MG/ML ~~LOC~~ SOAJ: 140.0000 mg | SUBCUTANEOUS | 2 refills | Status: DC

## 2021-10-05 MED ORDER — LOSARTAN POTASSIUM 100 MG PO TABS: 100.0000 mg | ORAL_TABLET | Freq: Every day | ORAL | 1 refills | Status: DC

## 2021-10-05 MED ORDER — ATORVASTATIN CALCIUM 80 MG PO TABS: 80.0000 mg | ORAL_TABLET | Freq: Every day | ORAL | 1 refills | Status: DC

## 2021-10-05 MED ORDER — DAPAGLIFLOZIN PROPANEDIOL 10 MG PO TABS: 10.0000 mg | ORAL_TABLET | Freq: Every day | ORAL | 1 refills | Status: DC

## 2021-10-05 MED ORDER — AMLODIPINE BESYLATE 5 MG PO TABS: 5.0000 mg | ORAL_TABLET | Freq: Every day | ORAL | 2 refills | Status: DC

## 2021-10-05 MED ORDER — CLOPIDOGREL BISULFATE 75 MG PO TABS: 75.0000 mg | ORAL_TABLET | Freq: Every day | ORAL | 1 refills | Status: DC

## 2021-10-05 NOTE — Telephone Encounter (Signed)
REPATHA SURE INJ '140MG'$ /ML, use as directed, is approved through 04/06/2022 under your ?Medicare Part D benefit. Reviewed by: System ?If the treating physician would like to discuss this coverage decision with the physician or health care ?professional reviewer, please call [employer] Rx Prior Authorization department at [phone]. ? ? ?Pharmacy aware. ?

## 2021-10-05 NOTE — Telephone Encounter (Signed)
(  Key: [id number]) ?Rx #: [id number] ?Repatha SureClick '140MG'$ /ML auto-injectors ? ? ?PA in process/ww  ?

## 2021-10-05 NOTE — Progress Notes (Signed)
? ?Assessment & Plan:  ?1. Severe uncontrolled hypertension ?Uncontrolled.  Continue hydralazine 10 mg 3 times daily and losartan 100 mg daily.  Start amlodipine 5 mg daily.  Encouraged patient to continue monitoring blood pressure at home and keeping a log. ?- hydrALAZINE (APRESOLINE) 10 MG tablet; Take 1 tablet (10 mg total) by mouth 3 (three) times daily.  Dispense: 270 tablet; Refill: 1 ?- losartan (COZAAR) 100 MG tablet; Take 1 tablet (100 mg total) by mouth daily.  Dispense: 90 tablet; Refill: 1 ?- REPATHA SURECLICK 182 MG/ML SOAJ; Inject 140 mg into the skin every 14 (fourteen) days.  Dispense: 2 mL; Refill: 2 ?- amLODipine (NORVASC) 5 MG tablet; Take 1 tablet (5 mg total) by mouth daily.  Dispense: 30 tablet; Refill: 2 ?- CBC with Differential/Platelet ?- CMP14+EGFR ?- Lipid panel ? ?2. Mixed hyperlipidemia ?Uncontrolled.  Patient never received Repatha.  Prescription resent today. ?- atorvastatin (LIPITOR) 80 MG tablet; Take 1 tablet (80 mg total) by mouth daily at 6 PM.  Dispense: 90 tablet; Refill: 1 ?- REPATHA SURECLICK 993 MG/ML SOAJ; Inject 140 mg into the skin every 14 (fourteen) days.  Dispense: 2 mL; Refill: 2 ?- CBC with Differential/Platelet ?- CMP14+EGFR ?- Lipid panel ? ?3. Central retinal vein occlusion, unspecified complication status, unspecified laterality ?- REPATHA SURECLICK 716 MG/ML SOAJ; Inject 140 mg into the skin every 14 (fourteen) days.  Dispense: 2 mL; Refill: 2 ?- CMP14+EGFR ?- Lipid panel ? ?4. Stage 3b chronic kidney disease ([place]) ?Continue Farxiga. ?- dapagliflozin propanediol (FARXIGA) 10 MG TABS tablet; Take 1 tablet (10 mg total) by mouth daily before breakfast.  Dispense: 90 tablet; Refill: 1 ?- CMP14+EGFR ? ?5. History of CVA (cerebrovascular accident) ?Continue Plavix and atorvastatin.  Start Repatha. ?- clopidogrel (PLAVIX) 75 MG tablet; Take 1 tablet (75 mg total) by mouth daily.  Dispense: 90 tablet; Refill: 1 ?- atorvastatin (LIPITOR) 80 MG tablet; Take 1 tablet (80  mg total) by mouth daily at 6 PM.  Dispense: 90 tablet; Refill: 1 ?- REPATHA SURECLICK 967 MG/ML SOAJ; Inject 140 mg into the skin every 14 (fourteen) days.  Dispense: 2 mL; Refill: 2 ?- CBC with Differential/Platelet ?- CMP14+EGFR ?- Lipid panel ? ?6. Encounter for hepatitis C screening test for low risk patient ?- Hepatitis C antibody (reflex, frozen specimen) ? ? ?Return in about 3 weeks (around 10/26/2021) for HTN. ? ?Hendricks Limes, MSN, APRN, FNP-C ?[place] ? ?Subjective:  ? ? Patient ID: Valerie Coleman, female    DOB: 29-Jun-1960, 61 y.o.   MRN: [id number] ? ?Patient Care Team: ?Loman Brooklyn, FNP as PCP - General (Family Medicine) ?Ilean China, RN as Case Manager ?Katha Cabal, LCSW as [place] Management (Licensed Holiday representative)  ? ?Chief Complaint:  ?Chief Complaint  ?Patient presents with  ? Medical Management of Chronic Issues  ? Hypertension  ? Hyperlipidemia  ? ? ?HPI: ?Valerie Coleman is a 61 y.o. female presenting on 10/05/2021 for Medical Management of Chronic Issues, Hypertension, and Hyperlipidemia ? ?Hypertension: patient occasionally checks her blood pressure at home and reports 180s/100s. Hardly eats any salt. She is walking daily for exercise. Denies chest pain and shortness of breath.  ? ?CKD: taking [place]. ? ?Hyperlipidemia: has been taking atorvastatin daily. Repatha ordered in January; patient reports she never received it. ? ?History of CVA: taking atorvastatin and Plavix daily.  ? ? ?New complaints: ?None ? ? ?Social history: ? ?Relevant past medical, surgical, family and social history reviewed and  updated as indicated. Interim medical history since our last visit reviewed. ? ?Allergies and medications reviewed and updated. ? ?DATA REVIEWED: CHART IN EPIC ? ?ROS: Negative unless specifically indicated above in HPI.  ? ? ?Current Outpatient Medications:  ?  atorvastatin (LIPITOR) 80 MG tablet, Take 1 tablet (80 mg total) by  mouth daily at 6 PM., Disp: 90 tablet, Rfl: 1 ?  clopidogrel (PLAVIX) 75 MG tablet, Take 1 tablet (75 mg total) by mouth daily., Disp: 30 tablet, Rfl: 1 ?  dapagliflozin propanediol (FARXIGA) 10 MG TABS tablet, Take 1 tablet (10 mg total) by mouth daily before breakfast., Disp: 90 tablet, Rfl: 1 ?  hydrALAZINE (APRESOLINE) 10 MG tablet, Take 1 tablet (10 mg total) by mouth 3 (three) times daily., Disp: 90 tablet, Rfl: 0 ?  losartan (COZAAR) 100 MG tablet, Take 1 tablet (100 mg total) by mouth daily., Disp: 90 tablet, Rfl: 0 ?  nystatin (MYCOSTATIN/NYSTOP) powder, Apply 1 application. topically 3 (three) times daily., Disp: 15 g, Rfl: 0 ?  REPATHA SURECLICK 626 MG/ML SOAJ, Inject 140 mg into the skin every 14 (fourteen) days., Disp: 2 mL, Rfl: 2  ? ?No Known Allergies ?Past Medical History:  ?Diagnosis Date  ? Bilateral lower extremity edema   ? Blind   ? CKD (chronic kidney disease)   ? CVA (cerebral vascular accident) ([place]) 05/2017  ? Hypertension   ? Vitamin D deficiency   ?  ?Past Surgical History:  ?Procedure Laterality Date  ? CHOLECYSTECTOMY    ? EYE SURGERY    ? TUBAL LIGATION    ?  ?Social History  ? ?Socioeconomic History  ? Marital status: Single  ?  Spouse name: Not on file  ? Number of children: Not on file  ? Years of education: Not on file  ? Highest education level: Not on file  ?Occupational History  ? Not on file  ?Tobacco Use  ? Smoking status: Former  ?  Types: Cigarettes  ? Smokeless tobacco: Never  ?Vaping Use  ? Vaping Use: Never used  ?Substance and Sexual Activity  ? Alcohol use: Not Currently  ?  Alcohol/week: 3.0 standard drinks  ?  Types: 2 Cans of beer, 1 Shots of liquor per week  ?  Comment: occ  ? Drug use: No  ? Sexual activity: Not on file  ?Other Topics Concern  ? Not on file  ?Social History Narrative  ? Not on file  ? ?Social Determinants of Health  ? ?Financial Resource Strain: Low Risk   ? Difficulty of Paying Living Expenses: Not hard at all  ?Food Insecurity: Food Insecurity  Present  ? Worried About Charity fundraiser in the Last Year: Sometimes true  ? Ran Out of Food in the Last Year: Sometimes true  ?Transportation Needs: No Transportation Needs  ? Lack of Transportation (Medical): No  ? Lack of Transportation (Non-Medical): No  ?Physical Activity: Inactive  ? Days of Exercise per Week: 0 days  ? Minutes of Exercise per Session: 0 min  ?Stress: Stress Concern Present  ? Feeling of Stress : To some extent  ?Social Connections: Socially Isolated  ? Frequency of Communication with Friends and Family: More than three times a week  ? Frequency of Social Gatherings with Friends and Family: Once a week  ? Attends Religious Services: Never  ? Active Member of Clubs or Organizations: No  ? Attends Archivist Meetings: Never  ? Marital Status: Never married  ?Intimate Partner Violence: Not on  file  ?  ? ?   ?Objective:  ?  ?BP (!) 195/108   Pulse 72   Ht _0  (1.854 m)   Wt 197 lb (89.4 kg)   SpO2 96%   BMI 25.99 kg/m?  ? ?Wt Readings from Last 3 Encounters:  ?10/05/21 197 lb (89.4 kg)  ?06/17/21 193 lb 12.8 oz (87.9 kg)  ?10/05/20 191 lb (86.6 kg)  ? ? ?Physical Exam ?Vitals reviewed.  ?Constitutional:   ?   General: She is not in acute distress. ?   Appearance: Normal appearance. She is normal weight. She is not ill-appearing, toxic-appearing or diaphoretic.  ?HENT:  ?   Head: Normocephalic and atraumatic.  ?Eyes:  ?   General: No scleral icterus.    ?   Right eye: No discharge.     ?   Left eye: No discharge.  ?   Conjunctiva/sclera: Conjunctivae normal.  ?Cardiovascular:  ?   Rate and Rhythm: Normal rate and regular rhythm.  ?   Heart sounds: Normal heart sounds. No murmur heard. ?  No friction rub. No gallop.  ?Pulmonary:  ?   Effort: Pulmonary effort is normal. No respiratory distress.  ?   Breath sounds: Normal breath sounds. No stridor. No wheezing, rhonchi or rales.  ?Musculoskeletal:     ?   General: Normal range of motion.  ?   Cervical back: Normal range of motion.   ?Skin: ?   General: Skin is warm and dry.  ?   Capillary Refill: Capillary refill takes less than 2 seconds.  ?Neurological:  ?   General: No focal deficit present.  ?   Mental Status: She is alert and orien

## 2021-10-06 LAB — CBC WITH DIFFERENTIAL/PLATELET
Basophils Absolute: 0 10*3/uL (ref 0.0–0.2)
Basos: 0 %
EOS (ABSOLUTE): 0.1 10*3/uL (ref 0.0–0.4)
Eos: 2 %
Hematocrit: 42.5 % (ref 34.0–46.6)
Hemoglobin: 14.6 g/dL (ref 11.1–15.9)
Immature Grans (Abs): 0 10*3/uL (ref 0.0–0.1)
Immature Granulocytes: 0 %
Lymphocytes Absolute: 1.7 10*3/uL (ref 0.7–3.1)
Lymphs: 24 %
MCH: 29.1 pg (ref 26.6–33.0)
MCHC: 34.4 g/dL (ref 31.5–35.7)
MCV: 85 fL (ref 79–97)
Monocytes Absolute: 0.6 10*3/uL (ref 0.1–0.9)
Monocytes: 8 %
Neutrophils Absolute: 4.7 10*3/uL (ref 1.4–7.0)
Neutrophils: 66 %
Platelets: 267 10*3/uL (ref 150–450)
RBC: 5.01 x10E6/uL (ref 3.77–5.28)
RDW: 14.5 % (ref 11.7–15.4)
WBC: 7.2 10*3/uL (ref 3.4–10.8)

## 2021-10-06 LAB — CMP14+EGFR
ALT: 19 IU/L (ref 0–32)
AST: 14 IU/L (ref 0–40)
Albumin/Globulin Ratio: 1.6 (ref 1.2–2.2)
Albumin: 4.5 g/dL (ref 3.8–4.8)
Alkaline Phosphatase: 61 IU/L (ref 44–121)
BUN/Creatinine Ratio: 10 — ABNORMAL LOW (ref 12–28)
BUN: 18 mg/dL (ref 8–27)
Bilirubin Total: 0.3 mg/dL (ref 0.0–1.2)
CO2: 16 mmol/L — ABNORMAL LOW (ref 20–29)
Calcium: 9.6 mg/dL (ref 8.7–10.3)
Chloride: 107 mmol/L — ABNORMAL HIGH (ref 96–106)
Creatinine, Ser: 1.85 mg/dL — ABNORMAL HIGH (ref 0.57–1.00)
Globulin, Total: 2.9 g/dL (ref 1.5–4.5)
Glucose: 80 mg/dL (ref 70–99)
Potassium: 4.3 mmol/L (ref 3.5–5.2)
Sodium: 140 mmol/L (ref 134–144)
Total Protein: 7.4 g/dL (ref 6.0–8.5)
eGFR: 30 mL/min/{1.73_m2} — ABNORMAL LOW (ref >59)

## 2021-10-06 LAB — LIPID PANEL
Chol/HDL Ratio: 5.7 ratio — ABNORMAL HIGH (ref 0.0–4.4)
Cholesterol, Total: 287 mg/dL — ABNORMAL HIGH (ref 100–199)
HDL: 50 mg/dL (ref >39)
LDL Chol Calc (NIH): 187 mg/dL — ABNORMAL HIGH (ref 0–99)
Triglycerides: 261 mg/dL — ABNORMAL HIGH (ref 0–149)
VLDL Cholesterol Cal: 50 mg/dL — ABNORMAL HIGH (ref 5–40)

## 2021-10-06 LAB — HCV AB W REFLEX TO QUANT PCR: HCV Ab: NONREACTIVE

## 2021-10-06 LAB — HCV INTERPRETATION

## 2021-10-07 ENCOUNTER — Encounter: Payer: Self-pay | Admitting: Emergency Medicine

## 2021-10-09 DIAGNOSIS — H409 Unspecified glaucoma: Secondary | ICD-10-CM

## 2021-10-09 DIAGNOSIS — I10 Essential (primary) hypertension: Secondary | ICD-10-CM

## 2021-10-18 ENCOUNTER — Telehealth: Payer: Medicare Other

## 2021-10-18 ENCOUNTER — Telehealth: Payer: Self-pay | Admitting: Licensed Clinical Social Worker

## 2021-10-18 NOTE — Telephone Encounter (Signed)
?  Chronic Care Management  ?  ? Clinical Social Work Note ?  ?10/18/21 ?Name: Valerie Coleman            MRN: [id number]       DOB: 10/28/1960 ?  ?Valerie Coleman is a 61 y.o. year old female who is a primary care patient of Loman Brooklyn, FNP. The CCM team was consulted to assist the patient with chronic disease management and/or care coordination needs related to: [employer] .  ? ?Unsuccessful phone outreach by SW to client today.  LCSW was unable to leave message for client. LCSW also called phone number for daughter of client. LCSW was not able to speak with daughter of client. Phone answering machine for daughter was full; thus, LCSW could not leave phone message for daughter of client ? ?Follow Up Plan;  Care Guide scheduler to contact client to re-schedule LCSW phone visit with client ? ?Norva Riffle.Rhyan Wolters MSW, LCSW ?Licensed Clinical Social Worker ?[facility] ?[phone]  ?

## 2021-10-20 ENCOUNTER — Telehealth: Payer: Self-pay | Admitting: Family Medicine

## 2021-10-20 NOTE — Telephone Encounter (Signed)
PCS form typed up and placed on providers desk ?

## 2021-10-24 ENCOUNTER — Telehealth: Payer: Self-pay | Admitting: Family Medicine

## 2021-10-24 DIAGNOSIS — I10 Essential (primary) hypertension: Secondary | ICD-10-CM

## 2021-10-24 DIAGNOSIS — Z8673 Personal history of transient ischemic attack (TIA), and cerebral infarction without residual deficits: Secondary | ICD-10-CM

## 2021-10-24 NOTE — Telephone Encounter (Signed)
Informed pt these medications were sent to pharmacy on 10/05/21 for a 3 mos supply, there ws one that she only got a 1 mos supply. Instructed her to check with the pharmacy, they are able to split these down to a 1 mos supply when we send them in for 3 mos. ?

## 2021-10-24 NOTE — Telephone Encounter (Signed)
?  Prescription Request ? ?10/24/2021 ? ?Is this a "Controlled Substance" medicine? yes ? ?Have you seen your PCP in the last 2 weeks? no ? ?If YES, route message to pool  -  If NO, patient needs to be scheduled for appointment. ? ?What is the name of the medication or equipment? Hydralazine plavix ? ?Have you contacted your pharmacy to request a refill? no  ? ?Which pharmacy would you like this sent to? [facility] ? ? ?Patient notified that their request is being sent to the clinical staff for review and that they should receive a response within 2 business days.  ?  ?

## 2021-10-25 NOTE — Telephone Encounter (Signed)
NA/NVM, PCS form faxed to [employer] ?

## 2021-11-01 ENCOUNTER — Ambulatory Visit: Payer: Medicare Other | Admitting: Pharmacist

## 2021-11-02 ENCOUNTER — Telehealth: Payer: Self-pay

## 2021-11-02 NOTE — Progress Notes (Signed)
error 

## 2021-11-08 ENCOUNTER — Telehealth: Payer: Self-pay | Admitting: Family Medicine

## 2021-11-08 NOTE — Telephone Encounter (Signed)
Patient has been informed of results. She is aware to continue her [place]. She will call the pharmacy about refilling the [place]. The first Rx that she got she was unable to use because she did not refrigerate it. Reviewed instructions for repatha.

## 2021-11-11 ENCOUNTER — Telehealth: Payer: Self-pay

## 2021-11-11 NOTE — Chronic Care Management (AMB) (Signed)
  Chronic Care Management Note  11/11/2021 Name: Jessilynn Taft MRN: [id number] DOB: [birth year]  Cloey Sferrazza is a 61 y.o. year old female who is a primary care patient of Loman Brooklyn, FNP and is actively engaged with the care management team. I reached out to Luellen Pucker by phone today to assist with re-scheduling a follow up visit with the Licensed Clinical Social Worker  Follow up plan: Unsuccessful telephone outreach attempt made. A HIPAA compliant phone message was left for the patient providing contact information and requesting a return call.  The care management team will reach out to the patient again over the next 7 days.  If patient returns call to provider office, please advise to call [facility] [place], [address], [place], [place] [address] Direct Dial: [phone] [email or web address] Website: [email or web address]

## 2021-11-17 ENCOUNTER — Ambulatory Visit (INDEPENDENT_AMBULATORY_CARE_PROVIDER_SITE_OTHER): Payer: Medicare Other | Admitting: Pharmacist

## 2021-11-17 DIAGNOSIS — I10 Essential (primary) hypertension: Secondary | ICD-10-CM

## 2021-11-17 NOTE — Progress Notes (Signed)
       11/17/2021 Name: Valerie Coleman MRN: [id number] DOB: [birth year]   S:  17 YOF Presents for HYPERTENSION evaluation, education, and management Patient was referred and last seen by Primary Care Provider on 10/05/21.  Her BP at the last visit was 198/108.  Adjustments were made to medication regimen.  Patient has a history of CVA  Insurance coverage/medication affordability: uhc dual  Patient reports adherence with medications. Current hypertension medications include: amlodipine, losartan, hydralazine Goal 130/80 Current hyperlipidemia medications include: atorvastatin  O:  Lab Results  Component Value Date   HGBA1C 5.3 05/17/2017    Hasn't checked BP at home, although has BP cuff    Lipid Panel     Component Value Date/Time   CHOL 287 (H) 10/05/2021 1124   TRIG 261 (H) 10/05/2021 1124   HDL 50 10/05/2021 1124   CHOLHDL 5.7 (H) 10/05/2021 1124   CHOLHDL 6.3 05/17/2017 0316   VLDL 44 (H) 05/17/2017 0316   LDLCALC 187 (H) 10/05/2021 1124   A/P:  Hypertension longstanding currently UNCONTROLLED.  Blood pressure goal = <130/80 mmHg.  Patient medication adherence -- STATES COMPLIANCE.   Blood pressure control is suboptimal due to meds not optimized, patient not checking.  -ENCOURAGED PATIENT TO CHECK BP AT HOME OR COME TO OFFICE FOR BP CHECK.  SHE STATES SHE WILL TRY TO COME NEXT WEEK  -watch kidney function ON ARB (on SGLT2)  -CONTINUE ALL MEDICATIONS AS PRESCRIBED    Written patient instructions provided.  Total time in counseling 15 minutes.   Follow up Pharmacist next week   Regina Eck, PharmD, BCPS Clinical Pharmacist, [place]  II Phone [phone]

## 2021-11-23 ENCOUNTER — Telehealth: Payer: Self-pay | Admitting: Pharmacist

## 2021-11-23 NOTE — Telephone Encounter (Signed)
TC to [facility], they have refills on file. Pt aware, says they bring her meds to her. Recommended she contact them.

## 2021-11-23 NOTE — Telephone Encounter (Signed)
Patient states she doesn't have any refills left on plavix.  I saw notes in may from you about refills.  Can you address?

## 2021-11-23 NOTE — Chronic Care Management (AMB) (Signed)
  Chronic Care Management Note  11/23/2021 Name: Valerie Coleman MRN: [id number] DOB: 07/20/1960  [name] is a 61 y.o. year old female who is a primary care patient of Loman Brooklyn, FNP and is actively engaged with the care management team. I reached out to Luellen Pucker by phone today to assist with re-scheduling a follow up visit with the Licensed Clinical Social Worker  Follow up plan: Telephone appointment with care management team member scheduled for:11/28/2021  Noreene Larsson, [address], [place], [place] [address] Direct Dial: [phone] [email or web address]: [email or web address]

## 2021-11-28 ENCOUNTER — Telehealth: Payer: Medicare Other

## 2021-12-27 NOTE — Chronic Care Management (AMB) (Signed)
Chronic Care Management   CCM RN Visit Note  09/13/2021 Name: Valerie Coleman MRN: [id number] DOB: 08-Dec-1960  Subjective: Valerie Coleman is a 61 y.o. year old female who is a primary care patient of Loman Brooklyn, FNP. The care management team was consulted for assistance with disease management and care coordination needs.    Engaged with patient by telephone for follow up visit in response to provider referral for case management and/or care coordination services.   Consent to Services:  The patient was given information about Chronic Care Management services, agreed to services, and gave verbal consent prior to initiation of services.  Please see initial visit note for detailed documentation.   Patient agreed to services and verbal consent obtained.   Assessment: Review of patient past medical history, allergies, medications, health status, including review of consultants reports, laboratory and other test data, was performed as part of comprehensive evaluation and provision of chronic care management services.   SDOH (Social Determinants of Health) assessments and interventions performed:    CCM Care Plan  No Known Allergies  Outpatient Encounter Medications as of 09/13/2021  Medication Sig   [DISCONTINUED] atorvastatin (LIPITOR) 80 MG tablet Take 1 tablet (80 mg total) by mouth daily at 6 PM.   [DISCONTINUED] clopidogrel (PLAVIX) 75 MG tablet Take 1 tablet (75 mg total) by mouth daily.   [DISCONTINUED] dapagliflozin propanediol (FARXIGA) 10 MG TABS tablet Take 1 tablet (10 mg total) by mouth daily before breakfast.   [DISCONTINUED] hydrALAZINE (APRESOLINE) 10 MG tablet Take 1 tablet (10 mg total) by mouth 3 (three) times daily.   [DISCONTINUED] losartan (COZAAR) 100 MG tablet Take 1 tablet (100 mg total) by mouth daily.   [DISCONTINUED] REPATHA SURECLICK 720 MG/ML SOAJ Inject 140 mg into the skin every 14 (fourteen) days.   No facility-administered encounter medications on file as  of 09/13/2021.    Patient Active Problem List   Diagnosis Date Noted   Intertriginous dermatitis associated with moisture 09/29/2020   Endometrial polyp 04/26/2020   Atypical glandular cells of undetermined significance (AGUS) on cervical Pap smear 04/12/2020   Right ovarian cyst 04/12/2020   Uterine leiomyoma 04/12/2020   Glaucoma of both eyes    Blind left eye    Severe uncontrolled hypertension    Hyperlipidemia    History of CVA (cerebrovascular accident) 05/16/2017   CKD (chronic kidney disease) stage 3, GFR 30-59 ml/min ([place]) 01/04/2017   Central retinal vein occlusion     Conditions to be addressed/monitored:HTN and glaucoma  Care Plan : [facility] Care Plan     Problem: Chronic Disease Management Needs   Priority: High  Onset Date: 06/23/2021     Long-Range Goal: Patient will Work with RN Care Manager to Develop a Plan of Care Regarding Care Management and Care Coordination Associated with HTN, CKD, blindness due to glaucoma, food insecurity   Start Date: 06/23/2021  Expected End Date: 06/23/2022  This Visit's Progress: On track  Recent Progress: On track  Priority: High  Note:   Current Barriers:  Knowledge Deficits related to plan of care for management of HTN  Chronic Disease Management support and education needs related to HTN Financial Constraints.   RNCM Clinical Goal(s):  Patient will verbalize understanding of plan for management of HTN as evidenced by checking and recording blood pressure daily ad by calling PCP with any readings outside of recommended range and by taking medication as prescribed take all medications exactly as prescribed and will call provider for medication related questions as  evidenced by documentation in EMR    demonstrate improved adherence to prescribed treatment plan for HTN as evidenced by medication and assessment compliance and improvement in blood pressure readings continue to work with Consulting civil engineer and/or Social Worker to address  care management and care coordination needs related to HTN as evidenced by adherence to CM Team Scheduled appointments     through collaboration with LCSW, provider, and care team.   Interventions: 1:1 collaboration with primary care provider regarding development and update of comprehensive plan of care as evidenced by provider attestation and co-signature Inter-disciplinary care team collaboration (see longitudinal plan of care) Evaluation of current treatment plan related to  self management and patient's adherence to plan as established by provider   SDOH Barriers (Status: Goal Met.) Short Term Goal        Patient interviewed and appropriate assessments performed Referred patient to community resources care guide team for assistance with food resources Patient has been outreached by [employer] care guide team and LCSW and has been provided with necessary resource information    Hypertension: (Status: Goal on track: NO.) Long Term Goal  Last practice recorded BP readings:  BP Readings from Last 10 Encounters:  06/17/21 (!) 208/112  10/05/20 (!) 179/99  09/29/20 (!) 217/110  Evaluation of current treatment plan related to hypertension self management and patient's adherence to plan as established by provider;   Reviewed medications with patient and discussed importance of compliance;  Discussed plans with patient for ongoing care management follow up and provided patient with direct contact information for care management team; Advised patient, providing education and rationale, to monitor blood pressure daily and record, calling PCP for findings outside established parameters;  Assessed social determinant of health barriers;  Encouraged to schedule regular follow-ups with PCP Reviewed dietary recommendation: low sodium/DASH diet  Patient Goals/Self-Care Activities: Take medications as prescribed   Attend all scheduled provider appointments Call provider office for new  concerns or questions  check blood pressure 3 times per week write blood pressure results in a log or diary take blood pressure log to all doctor appointments eat more whole grains, fruits and vegetables, lean meats and healthy fats Limit salt/sodium intake Ask neighbor/friend for assistance in writing down blood pressure readings in your log to bring to your office visit Call [phone] to schedule an appointment with your PCP  Call RN Care Manager as needed [phone]  Plan:The patient has been provided with contact information for the care management team and has been advised to call with any health related questions or concerns.  The care management team will reach out to the patient again over the next 30 days.  Chong Sicilian, BSN, RN-BC Embedded Chronic Care Manager [facility] [place] [facility] Direct Dial: [phone]

## 2021-12-27 NOTE — Patient Instructions (Signed)
Visit Information  Thank you for taking time to visit with me today. Please don't hesitate to contact me if I can be of assistance to you before our next scheduled telephone appointment.  Following are the goals we discussed today:  Take medications as prescribed   Attend all scheduled provider appointments Call provider office for new concerns or questions  check blood pressure 3 times per week write blood pressure results in a log or diary take blood pressure log to all doctor appointments eat more whole grains, fruits and vegetables, lean meats and healthy fats Limit salt/sodium intake Ask neighbor/friend for assistance in writing down blood pressure readings in your log to bring to your office visit Call [phone] to schedule an appointment with your PCP  Call RN Care Manager as needed [phone]  Please call the care guide team at [phone] if you need to cancel or reschedule your appointment.   If you are experiencing a Mental Health or [place] or need someone to talk to, please call the [facility]: [phone] call [phone]   The patient verbalized understanding of instructions, educational materials, and care plan provided today and DECLINED offer to receive copy of patient instructions, educational materials, and care plan.   Chong Sicilian, BSN, RN-BC Embedded Chronic Care Manager [facility] / [place] [facility] Direct Dial: [phone]

## 2022-01-12 ENCOUNTER — Ambulatory Visit: Payer: Self-pay | Admitting: *Deleted

## 2022-01-12 NOTE — Patient Instructions (Signed)
Valerie Coleman  At some point during the past 4 years, I have worked with you through the [place] Management Program at [place].  Due to program changes I am removing myself from your care team.   If you are currently active with another CCM Team Member, you will remain active with them unless they reach out to you with additional information.   If you feel that you need services in the future,  please talk with your primary care provider and request a new referral for Care Management or Care Coordination services. This does not affect your status as a patient at [facility].   Thank you for allowing me to participate in your your healthcare journey.  Chong Sicilian, BSN, RN-BC Proofreader Dial: [phone]

## 2022-01-12 NOTE — Chronic Care Management (AMB) (Signed)
  Chronic Care Management   Note  01/12/2022 Name: Valerie Coleman MRN: [id number] DOB: 10/13/1960   Due to changes in the Chronic Care Management program, I am removing myself as the [place] from the Care Team and closing [name]. Patient was not scheduled to be followed by the RN Care Coordination nurse for [facility].   Patient has an open Care Plan with another CCM team member, Theadore Nan, LCSW.  I will forward this case closure encounter to the other team member(s). Patient does not have a current CCM referral placed since 10/10/21. CCM enrollment status changed to "not enrolled".   Patient's PCP can place a new referral if the they needs Care Management or Care Coordination services in the future.  Chong Sicilian, BSN, RN-BC Proofreader Dial: [phone]

## 2022-01-17 ENCOUNTER — Other Ambulatory Visit: Payer: Self-pay | Admitting: Family Medicine

## 2022-01-17 DIAGNOSIS — I10 Essential (primary) hypertension: Secondary | ICD-10-CM

## 2022-02-14 ENCOUNTER — Other Ambulatory Visit: Payer: Self-pay | Admitting: Family Medicine

## 2022-02-14 DIAGNOSIS — I10 Essential (primary) hypertension: Secondary | ICD-10-CM

## 2022-03-13 ENCOUNTER — Telehealth: Payer: Self-pay | Admitting: *Deleted

## 2022-03-13 NOTE — Telephone Encounter (Signed)
Error - request came from [facility]

## 2022-03-13 NOTE — Telephone Encounter (Signed)
Message from Plan Request Reference Number: [id number]. REPATHA SURE INJ '140MG'$ /ML is approved through 06/12/2023. Your patient may now fill this prescription and it will be covered.  [facility] aware

## 2022-03-13 NOTE — Telephone Encounter (Signed)
Valerie Coleman (Key: [id number]) Repatha SureClick '140MG'$ /ML auto-injectors  Sent to plan

## 2022-05-31 ENCOUNTER — Ambulatory Visit: Payer: Medicare Other | Admitting: Family Medicine

## 2022-06-01 ENCOUNTER — Ambulatory Visit (INDEPENDENT_AMBULATORY_CARE_PROVIDER_SITE_OTHER): Payer: Medicare Other | Admitting: Family Medicine

## 2022-06-01 ENCOUNTER — Other Ambulatory Visit: Payer: Self-pay

## 2022-06-01 ENCOUNTER — Encounter: Payer: Self-pay | Admitting: Family Medicine

## 2022-06-01 VITALS — BP 208/103 | HR 71 | Temp 98.6°F | Ht 73.0 in | Wt 196.0 lb

## 2022-06-01 DIAGNOSIS — H409 Unspecified glaucoma: Secondary | ICD-10-CM

## 2022-06-01 DIAGNOSIS — N1832 Chronic kidney disease, stage 3b: Secondary | ICD-10-CM

## 2022-06-01 DIAGNOSIS — R9431 Abnormal electrocardiogram [ECG] [EKG]: Secondary | ICD-10-CM | POA: Diagnosis not present

## 2022-06-01 DIAGNOSIS — E782 Mixed hyperlipidemia: Secondary | ICD-10-CM | POA: Diagnosis not present

## 2022-06-01 DIAGNOSIS — I10 Essential (primary) hypertension: Secondary | ICD-10-CM

## 2022-06-01 DIAGNOSIS — Z8673 Personal history of transient ischemic attack (TIA), and cerebral infarction without residual deficits: Secondary | ICD-10-CM

## 2022-06-01 DIAGNOSIS — H348192 Central retinal vein occlusion, unspecified eye, stable: Secondary | ICD-10-CM

## 2022-06-01 MED ORDER — DAPAGLIFLOZIN PROPANEDIOL 10 MG PO TABS
10.0000 mg | ORAL_TABLET | Freq: Every day | ORAL | 1 refills | Status: DC
  Filled 2022-06-01: qty 90, 90d supply, fill #0

## 2022-06-01 MED ORDER — LOSARTAN POTASSIUM 100 MG PO TABS
100.0000 mg | ORAL_TABLET | Freq: Every day | ORAL | 0 refills | Status: DC
  Filled 2022-06-01: qty 90, 90d supply, fill #0

## 2022-06-01 MED ORDER — ATORVASTATIN CALCIUM 80 MG PO TABS
80.0000 mg | ORAL_TABLET | Freq: Every day | ORAL | 1 refills | Status: DC
  Filled 2022-06-01: qty 90, 90d supply, fill #0

## 2022-06-01 MED ORDER — HYDRALAZINE HCL 10 MG PO TABS
10.0000 mg | ORAL_TABLET | Freq: Three times a day (TID) | ORAL | 0 refills | Status: DC
  Filled 2022-06-01: qty 211, 70d supply, fill #0
  Filled 2022-06-01: qty 59, 20d supply, fill #0
  Filled 2022-06-01: qty 69, 23d supply, fill #0
  Filled 2022-06-01: qty 270, 90d supply, fill #0

## 2022-06-01 MED ORDER — HYDROCHLOROTHIAZIDE 12.5 MG PO TABS
12.5000 mg | ORAL_TABLET | Freq: Every day | ORAL | 0 refills | Status: DC
  Filled 2022-06-01: qty 90, 90d supply, fill #0

## 2022-06-01 MED ORDER — CLOPIDOGREL BISULFATE 75 MG PO TABS
75.0000 mg | ORAL_TABLET | Freq: Every day | ORAL | 0 refills | Status: DC
  Filled 2022-06-01: qty 90, 90d supply, fill #0

## 2022-06-01 MED ORDER — AMLODIPINE BESYLATE 10 MG PO TABS
10.0000 mg | ORAL_TABLET | Freq: Every day | ORAL | 0 refills | Status: DC
  Filled 2022-06-01: qty 90, 90d supply, fill #0

## 2022-06-01 NOTE — Patient Instructions (Signed)
Hypertension, Adult High blood pressure (hypertension) is when the force of blood pumping through the arteries is too strong. The arteries are the blood vessels that carry blood from the heart throughout the body. Hypertension forces the heart to work harder to pump blood and may cause arteries to become narrow or stiff. Untreated or uncontrolled hypertension can lead to a heart attack, heart failure, a stroke, kidney disease, and other problems. A blood pressure reading consists of a higher number over a lower number. Ideally, your blood pressure should be below 120/80. The first ("top") number is called the systolic pressure. It is a measure of the pressure in your arteries as your heart beats. The second ("bottom") number is called the diastolic pressure. It is a measure of the pressure in your arteries as the heart relaxes. What are the causes? The exact cause of this condition is not known. There are some conditions that result in high blood pressure. What increases the risk? Certain factors may make you more likely to develop high blood pressure. Some of these risk factors are under your control, including: Smoking. Not getting enough exercise or physical activity. Being overweight. Having too much fat, sugar, calories, or salt (sodium) in your diet. Drinking too much alcohol. Other risk factors include: Having a personal history of heart disease, diabetes, high cholesterol, or kidney disease. Stress. Having a family history of high blood pressure and high cholesterol. Having obstructive sleep apnea. Age. The risk increases with age. What are the signs or symptoms? High blood pressure may not cause symptoms. Very high blood pressure (hypertensive crisis) may cause: Headache. Fast or irregular heartbeats (palpitations). Shortness of breath. Nosebleed. Nausea and vomiting. Vision changes. Severe chest pain, dizziness, and seizures. How is this diagnosed? This condition is diagnosed by  measuring your blood pressure while you are seated, with your arm resting on a flat surface, your legs uncrossed, and your feet flat on the floor. The cuff of the blood pressure monitor will be placed directly against the skin of your upper arm at the level of your heart. Blood pressure should be measured at least twice using the same arm. Certain conditions can cause a difference in blood pressure between your right and left arms. If you have a high blood pressure reading during one visit or you have normal blood pressure with other risk factors, you may be asked to: Return on a different day to have your blood pressure checked again. Monitor your blood pressure at home for 1 week or longer. If you are diagnosed with hypertension, you may have other blood or imaging tests to help your health care provider understand your overall risk for other conditions. How is this treated? This condition is treated by making healthy lifestyle changes, such as eating healthy foods, exercising more, and reducing your alcohol intake. You may be referred for counseling on a healthy diet and physical activity. Your health care provider may prescribe medicine if lifestyle changes are not enough to get your blood pressure under control and if: Your systolic blood pressure is above 130. Your diastolic blood pressure is above 80. Your personal target blood pressure may vary depending on your medical conditions, your age, and other factors. Follow these instructions at home: Eating and drinking  Eat a diet that is high in fiber and potassium, and low in sodium, added sugar, and fat. An example of this eating plan is called the DASH diet. DASH stands for Dietary Approaches to Stop Hypertension. To eat this way: Eat   plenty of fresh fruits and vegetables. Try to fill one half of your plate at each meal with fruits and vegetables. Eat whole grains, such as whole-wheat pasta, brown rice, or whole-grain bread. Fill about one  fourth of your plate with whole grains. Eat or drink low-fat dairy products, such as skim milk or low-fat yogurt. Avoid fatty cuts of meat, processed or cured meats, and poultry with skin. Fill about one fourth of your plate with lean proteins, such as fish, chicken without skin, beans, eggs, or tofu. Avoid pre-made and processed foods. These tend to be higher in sodium, added sugar, and fat. Reduce your daily sodium intake. Many people with hypertension should eat less than 1,500 mg of sodium a day. Do not drink alcohol if: Your health care provider tells you not to drink. You are pregnant, may be pregnant, or are planning to become pregnant. If you drink alcohol: Limit how much you have to: 0-1 drink a day for women. 0-2 drinks a day for men. Know how much alcohol is in your drink. In the [place] one drink equals one 12 oz bottle of beer (355 mL), one 5 oz glass of wine (148 mL), or one 1 oz glass of hard liquor (44 mL). Lifestyle  Work with your health care provider to maintain a healthy body weight or to lose weight. Ask what an ideal weight is for you. Get at least 30 minutes of exercise that causes your heart to beat faster (aerobic exercise) most days of the week. Activities may include walking, swimming, or biking. Include exercise to strengthen your muscles (resistance exercise), such as Pilates or lifting weights, as part of your weekly exercise routine. Try to do these types of exercises for 30 minutes at least 3 days a week. Do not use any products that contain nicotine or tobacco. These products include cigarettes, chewing tobacco, and vaping devices, such as e-cigarettes. If you need help quitting, ask your health care provider. Monitor your blood pressure at home as told by your health care provider. Keep all follow-up visits. This is important. Medicines Take over-the-counter and prescription medicines only as told by your health care provider. Follow directions carefully. Blood  pressure medicines must be taken as prescribed. Do not skip doses of blood pressure medicine. Doing this puts you at risk for problems and can make the medicine less effective. Ask your health care provider about side effects or reactions to medicines that you should watch for. Contact a health care provider if you: Think you are having a reaction to a medicine you are taking. Have headaches that keep coming back (recurring). Feel dizzy. Have swelling in your ankles. Have trouble with your vision. Get help right away if you: Develop a severe headache or confusion. Have unusual weakness or numbness. Feel faint. Have severe pain in your chest or abdomen. Vomit repeatedly. Have trouble breathing. These symptoms may be an emergency. Get help right away. Call 911. Do not wait to see if the symptoms will go away. Do not drive yourself to the hospital. Summary Hypertension is when the force of blood pumping through your arteries is too strong. If this condition is not controlled, it may put you at risk for serious complications. Your personal target blood pressure may vary depending on your medical conditions, your age, and other factors. For most people, a normal blood pressure is less than 120/80. Hypertension is treated with lifestyle changes, medicines, or a combination of both. Lifestyle changes include losing weight, eating a healthy,   low-sodium diet, exercising more, and limiting alcohol. This information is not intended to replace advice given to you by your health care provider. Make sure you discuss any questions you have with your health care provider. Document Revised: 04/05/2021 Document Reviewed: 04/05/2021 Elsevier Patient Education  2023 Elsevier Inc.  

## 2022-06-01 NOTE — Progress Notes (Signed)
Established Patient Office Visit  Subjective   Patient ID: Valerie Coleman, female    DOB: 04/30/1961  Age: 61 y.o. MRN: [id number]  Chief Complaint  Patient presents with   Establish Care   Medication Refill    Medication Refill   HTN Complaint with meds - Yes Current Medications - losartan 100 mg, amlodipine 5 mg, hydralazine 10 mg TID Pertinent ROS:  Headache - No Fatigue - No Visual Disturbances - reports stable.  Chest pain - No Dyspnea - No Palpitations - No LE edema - No  2. HLD She hasn't had repatha in a month or so ago. Her neighbor was giving her the injections. She is on atorvastatin 80 mg daily. She has never been on zetia.   3.  Blindness She has a history of glaucoma and blindness in her left eye, with some slight loss in her right eye. She sees [facility] but has not seen them in a year or so.   4. History of CVA. Reports hx of CVA x 2 without residual effects. She is on Plavix and Lipitor. She is not established with neurology. Denies weakness, dizziness, changes in vision, changes in speech or gait, confusion.  Past Medical History:  Diagnosis Date   Bilateral lower extremity edema    Blind    CKD (chronic kidney disease)    CVA (cerebral vascular accident) ([place]) 05/2017   Hypertension    Vitamin D deficiency       ROS As per HPI.    Objective:     BP (!) 208/103   Pulse 71   Temp 98.6 F (37 C)   Ht _0  (1.854 m)   Wt 196 lb (88.9 kg)   SpO2 100%   BMI 25.86 kg/m  BP Readings from Last 3 Encounters:  06/01/22 (!) 208/103  10/05/21 (!) 195/108  06/17/21 (!) 208/112      Physical Exam Vitals and nursing note reviewed.  Constitutional:      General: She is not in acute distress.    Appearance: She is not ill-appearing, toxic-appearing or diaphoretic.  HENT:     Head: Normocephalic and atraumatic.  Neck:     Thyroid: No thyroid mass, thyromegaly or thyroid tenderness.     Vascular: No carotid bruit.  Cardiovascular:      Rate and Rhythm: Normal rate and regular rhythm.     Heart sounds: Normal heart sounds. No murmur heard. Pulmonary:     Effort: Pulmonary effort is normal. No respiratory distress.     Breath sounds: Normal breath sounds. No wheezing, rhonchi or rales.  Abdominal:     General: Bowel sounds are normal. There is no distension.     Palpations: Abdomen is soft.     Tenderness: There is no abdominal tenderness. There is no guarding or rebound.  Musculoskeletal:     Cervical back: No rigidity.     Right lower leg: No edema.     Left lower leg: No edema.  Skin:    General: Skin is warm and dry.  Neurological:     General: No focal deficit present.     Mental Status: She is alert and oriented to person, place, and time.     Motor: No weakness.     Gait: Gait abnormal (uisng cane).  Psychiatric:        Mood and Affect: Mood normal.        Behavior: Behavior normal.        Thought Content: Thought  content normal.        Judgment: Judgment normal.      No results found for any visits on 06/01/22.    The ASCVD Risk score (Arnett DK, et al., 2019) failed to calculate for the following reasons:   The valid systolic blood pressure range is 90 to 200 mmHg    Assessment & Plan:   Shanikka was seen today for establish care and medication refill.  Diagnoses and all orders for this visit:  Severe uncontrolled hypertension Uncontrolled. Increase amlodipine to 10 and start HCTZ 12.5. Continue losartan and hydralazine. EKG concerning for left atrial enlargement, no acute changes from previous. Labs pending. Referral to cardiology placed for further evaluation and management.  -     hydrALAZINE (APRESOLINE) 10 MG tablet; Take 1 tablet (10 mg total) by mouth 3 (three) times daily. -     losartan (COZAAR) 100 MG tablet; Take 1 tablet (100 mg total) by mouth daily. -     amLODipine (NORVASC) 10 MG tablet; Take 1 tablet (10 mg total) by mouth daily. (NEEDS TO BE SEEN BEFORE NEXT REFILL) -      hydrochlorothiazide (HYDRODIURIL) 12.5 MG tablet; Take 1 tablet (12.5 mg total) by mouth daily. -     EKG 12-Lead -     CBC with Differential/Platelet -     CMP14+EGFR -     Lipid panel -     TSH -     Ambulatory referral to Cardiology  Abnormal EKG -     Ambulatory referral to Cardiology  Mixed hyperlipidemia Continue lipitor. Has been out of repatha for 1 month. She has never been on zetia. Discussed starting zetia pending lipid panel before restarting repatha.  -     atorvastatin (LIPITOR) 80 MG tablet; Take 1 tablet (80 mg total) by mouth daily at 6 PM. -     Lipid panel  Chronic kidney disease, stage 3b ([place]) On ARB and farxiga. Will discuss nephrology referral pending labs.  -     dapagliflozin propanediol (FARXIGA) 10 MG TABS tablet; Take 1 tablet (10 mg total) by mouth daily before breakfast. -     CMP14+EGFR  History of CVA (cerebrovascular accident) On plavix and liptior. Last MRI in 2018 should multiple old infarcts.She is not established with neurology and we will discuss this at her next appointment.  -     clopidogrel (PLAVIX) 75 MG tablet; Take 1 tablet (75 mg total) by mouth daily. -     atorvastatin (LIPITOR) 80 MG tablet; Take 1 tablet (80 mg total) by mouth daily at 6 PM. -     Lipid panel  Glaucoma of both eyes, unspecified glaucoma type Central retinal vein occlusion, unspecified complication status, unspecified laterality Discussed follow up with ophthalmology.    Return in about 2 weeks (around 06/15/2022) for BP follow up.   The patient indicates understanding of these issues and agrees with the plan.  Gwenlyn Perking, FNP

## 2022-06-02 ENCOUNTER — Other Ambulatory Visit: Payer: Self-pay | Admitting: Family Medicine

## 2022-06-02 ENCOUNTER — Telehealth: Payer: Self-pay | Admitting: Family Medicine

## 2022-06-02 ENCOUNTER — Other Ambulatory Visit: Payer: Self-pay

## 2022-06-02 DIAGNOSIS — I10 Essential (primary) hypertension: Secondary | ICD-10-CM

## 2022-06-02 DIAGNOSIS — N1832 Chronic kidney disease, stage 3b: Secondary | ICD-10-CM

## 2022-06-02 DIAGNOSIS — Z8673 Personal history of transient ischemic attack (TIA), and cerebral infarction without residual deficits: Secondary | ICD-10-CM

## 2022-06-02 DIAGNOSIS — H348192 Central retinal vein occlusion, unspecified eye, stable: Secondary | ICD-10-CM

## 2022-06-02 DIAGNOSIS — E782 Mixed hyperlipidemia: Secondary | ICD-10-CM

## 2022-06-02 LAB — CBC WITH DIFFERENTIAL/PLATELET
Basophils Absolute: 0 10*3/uL (ref 0.0–0.2)
Basos: 0 %
EOS (ABSOLUTE): 0.1 10*3/uL (ref 0.0–0.4)
Eos: 2 %
Hematocrit: 37.8 % (ref 34.0–46.6)
Hemoglobin: 12.6 g/dL (ref 11.1–15.9)
Immature Grans (Abs): 0 10*3/uL (ref 0.0–0.1)
Immature Granulocytes: 0 %
Lymphocytes Absolute: 1.8 10*3/uL (ref 0.7–3.1)
Lymphs: 23 %
MCH: 29.1 pg (ref 26.6–33.0)
MCHC: 33.3 g/dL (ref 31.5–35.7)
MCV: 87 fL (ref 79–97)
Monocytes Absolute: 0.5 10*3/uL (ref 0.1–0.9)
Monocytes: 7 %
Neutrophils Absolute: 5.3 10*3/uL (ref 1.4–7.0)
Neutrophils: 68 %
Platelets: 293 10*3/uL (ref 150–450)
RBC: 4.33 x10E6/uL (ref 3.77–5.28)
RDW: 14.6 % (ref 11.7–15.4)
WBC: 7.8 10*3/uL (ref 3.4–10.8)

## 2022-06-02 LAB — CMP14+EGFR
ALT: 15 IU/L (ref 0–32)
AST: 14 IU/L (ref 0–40)
Albumin/Globulin Ratio: 1.5 (ref 1.2–2.2)
Albumin: 4.4 g/dL (ref 3.9–4.9)
Alkaline Phosphatase: 54 IU/L (ref 44–121)
BUN/Creatinine Ratio: 10 — ABNORMAL LOW (ref 12–28)
BUN: 20 mg/dL (ref 8–27)
Bilirubin Total: 0.2 mg/dL (ref 0.0–1.2)
CO2: 19 mmol/L — ABNORMAL LOW (ref 20–29)
Calcium: 9.9 mg/dL (ref 8.7–10.3)
Chloride: 102 mmol/L (ref 96–106)
Creatinine, Ser: 1.94 mg/dL — ABNORMAL HIGH (ref 0.57–1.00)
Globulin, Total: 2.9 g/dL (ref 1.5–4.5)
Glucose: 74 mg/dL (ref 70–99)
Potassium: 4.7 mmol/L (ref 3.5–5.2)
Sodium: 140 mmol/L (ref 134–144)
Total Protein: 7.3 g/dL (ref 6.0–8.5)
eGFR: 29 mL/min/{1.73_m2} — ABNORMAL LOW (ref >59)

## 2022-06-02 LAB — LIPID PANEL
Chol/HDL Ratio: 6.2 ratio — ABNORMAL HIGH (ref 0.0–4.4)
Cholesterol, Total: 304 mg/dL — ABNORMAL HIGH (ref 100–199)
HDL: 49 mg/dL (ref >39)
LDL Chol Calc (NIH): 202 mg/dL — ABNORMAL HIGH (ref 0–99)
Triglycerides: 269 mg/dL — ABNORMAL HIGH (ref 0–149)
VLDL Cholesterol Cal: 53 mg/dL — ABNORMAL HIGH (ref 5–40)

## 2022-06-02 LAB — TSH: TSH: 2.6 u[IU]/mL (ref 0.450–4.500)

## 2022-06-02 MED ORDER — EZETIMIBE 10 MG PO TABS
10.0000 mg | ORAL_TABLET | Freq: Every day | ORAL | 3 refills | Status: DC
  Filled 2022-06-02: qty 90, 90d supply, fill #0

## 2022-06-02 MED ORDER — REPATHA SURECLICK 140 MG/ML ~~LOC~~ SOAJ
140.0000 mg | SUBCUTANEOUS | 2 refills | Status: DC
  Filled 2022-06-02: qty 2, 28d supply, fill #0

## 2022-06-02 NOTE — Telephone Encounter (Signed)
Spoke with Stu at [facility].  The refills were sent to [facility].  Stu will call over there and have the prescriptions transferred to him.

## 2022-06-08 ENCOUNTER — Other Ambulatory Visit: Payer: Self-pay

## 2022-06-09 ENCOUNTER — Encounter: Payer: Self-pay | Admitting: Family Medicine

## 2022-06-26 ENCOUNTER — Ambulatory Visit (INDEPENDENT_AMBULATORY_CARE_PROVIDER_SITE_OTHER): Payer: 59

## 2022-06-26 VITALS — Ht 72.0 in | Wt 196.0 lb

## 2022-06-26 DIAGNOSIS — Z0000 Encounter for general adult medical examination without abnormal findings: Secondary | ICD-10-CM

## 2022-06-26 NOTE — Patient Instructions (Signed)
Valerie Coleman , Thank you for taking time to come for your Medicare Wellness Visit. I appreciate your ongoing commitment to your health goals. Please review the following plan we discussed and let me know if I can assist you in the future.   These are the goals we discussed:  Goals      DIET - EAT MORE FRUITS AND VEGETABLES     Patient Stated     Keep supply of medications and keep doctor appts.        This is a list of the screening recommended for you and due dates:  Health Maintenance  Topic Date Due   COVID-19 Vaccine (1) Never done   DTaP/Tdap/Td vaccine (1 - Tdap) Never done   Zoster (Shingles) Vaccine (1 of 2) 08/31/2022*   Flu Shot  09/10/2022*   Mammogram  10/06/2022*   Colon Cancer Screening  10/06/2022*   Pap Smear  04/01/2023   Medicare Annual Wellness Visit  06/27/2023   Hepatitis C Screening: USPSTF Recommendation to screen - Ages 18-79 yo.  Completed   HIV Screening  Completed   HPV Vaccine  Aged Out  *Topic was postponed. The date shown is not the original due date.    Advanced directives: Advance directive discussed with you today. I have provided a copy for you to complete at home and have notarized. Once this is complete please bring a copy in to our office so we can scan it into your chart.   Conditions/risks identified: Aim for 30 minutes of exercise or brisk walking, 6-8 glasses of water, and 5 servings of fruits and vegetables each day.   Next appointment: Follow up in one year for your annual wellness visit    Preventive Care 65 Years and Older, Female Preventive care refers to lifestyle choices and visits with your health care provider that can promote health and wellness. What does preventive care include? A yearly physical exam. This is also called an annual well check. Dental exams once or twice a year. Routine eye exams. Ask your health care provider how often you should have your eyes checked. Personal lifestyle choices, including: Daily care of  your teeth and gums. Regular physical activity. Eating a healthy diet. Avoiding tobacco and drug use. Limiting alcohol use. Practicing safe sex. Taking low-dose aspirin every day. Taking vitamin and mineral supplements as recommended by your health care provider. What happens during an annual well check? The services and screenings done by your health care provider during your annual well check will depend on your age, overall health, lifestyle risk factors, and family history of disease. Counseling  Your health care provider may ask you questions about your: Alcohol use. Tobacco use. Drug use. Emotional well-being. Home and relationship well-being. Sexual activity. Eating habits. History of falls. Memory and ability to understand (cognition). Work and work Statistician. Reproductive health. Screening  You may have the following tests or measurements: Height, weight, and BMI. Blood pressure. Lipid and cholesterol levels. These may be checked every 5 years, or more frequently if you are over 62 years old. Skin check. Lung cancer screening. You may have this screening every year starting at age 72 if you have a 30-pack-year history of smoking and currently smoke or have quit within the past 15 years. Fecal occult blood test (FOBT) of the stool. You may have this test every year starting at age 18. Flexible sigmoidoscopy or colonoscopy. You may have a sigmoidoscopy every 5 years or a colonoscopy every 10 years starting at  age 23. Hepatitis C blood test. Hepatitis B blood test. Sexually transmitted disease (STD) testing. Diabetes screening. This is done by checking your blood sugar (glucose) after you have not eaten for a while (fasting). You may have this done every 1-3 years. Bone density scan. This is done to screen for osteoporosis. You may have this done starting at age 30. Mammogram. This may be done every 1-2 years. Talk to your health care provider about how often you should  have regular mammograms. Talk with your health care provider about your test results, treatment options, and if necessary, the need for more tests. Vaccines  Your health care provider may recommend certain vaccines, such as: Influenza vaccine. This is recommended every year. Tetanus, diphtheria, and acellular pertussis (Tdap, Td) vaccine. You may need a Td booster every 10 years. Zoster vaccine. You may need this after age 68. Pneumococcal 13-valent conjugate (PCV13) vaccine. One dose is recommended after age 2. Pneumococcal polysaccharide (PPSV23) vaccine. One dose is recommended after age 89. Talk to your health care provider about which screenings and vaccines you need and how often you need them. This information is not intended to replace advice given to you by your health care provider. Make sure you discuss any questions you have with your health care provider. Document Released: 06/25/2015 Document Revised: 02/16/2016 Document Reviewed: 03/30/2015 Elsevier Interactive Patient Education  2017 [place] Prevention in the Home Falls can cause injuries. They can happen to people of all ages. There are many things you can do to make your home safe and to help prevent falls. What can I do on the outside of my home? Regularly fix the edges of walkways and driveways and fix any cracks. Remove anything that might make you trip as you walk through a door, such as a raised step or threshold. Trim any bushes or trees on the path to your home. Use bright outdoor lighting. Clear any walking paths of anything that might make someone trip, such as rocks or tools. Regularly check to see if handrails are loose or broken. Make sure that both sides of any steps have handrails. Any raised decks and porches should have guardrails on the edges. Have any leaves, snow, or ice cleared regularly. Use sand or salt on walking paths during winter. Clean up any spills in your garage right away. This  includes oil or grease spills. What can I do in the bathroom? Use night lights. Install grab bars by the toilet and in the tub and shower. Do not use towel bars as grab bars. Use non-skid mats or decals in the tub or shower. If you need to sit down in the shower, use a plastic, non-slip stool. Keep the floor dry. Clean up any water that spills on the floor as soon as it happens. Remove soap buildup in the tub or shower regularly. Attach bath mats securely with double-sided non-slip rug tape. Do not have throw rugs and other things on the floor that can make you trip. What can I do in the bedroom? Use night lights. Make sure that you have a light by your bed that is easy to reach. Do not use any sheets or blankets that are too big for your bed. They should not hang down onto the floor. Have a firm chair that has side arms. You can use this for support while you get dressed. Do not have throw rugs and other things on the floor that can make you trip. What can I  do in the kitchen? Clean up any spills right away. Avoid walking on wet floors. Keep items that you use a lot in easy-to-reach places. If you need to reach something above you, use a strong step stool that has a grab bar. Keep electrical cords out of the way. Do not use floor polish or wax that makes floors slippery. If you must use wax, use non-skid floor wax. Do not have throw rugs and other things on the floor that can make you trip. What can I do with my stairs? Do not leave any items on the stairs. Make sure that there are handrails on both sides of the stairs and use them. Fix handrails that are broken or loose. Make sure that handrails are as long as the stairways. Check any carpeting to make sure that it is firmly attached to the stairs. Fix any carpet that is loose or worn. Avoid having throw rugs at the top or bottom of the stairs. If you do have throw rugs, attach them to the floor with carpet tape. Make sure that you  have a light switch at the top of the stairs and the bottom of the stairs. If you do not have them, ask someone to add them for you. What else can I do to help prevent falls? Wear shoes that: Do not have high heels. Have rubber bottoms. Are comfortable and fit you well. Are closed at the toe. Do not wear sandals. If you use a stepladder: Make sure that it is fully opened. Do not climb a closed stepladder. Make sure that both sides of the stepladder are locked into place. Ask someone to hold it for you, if possible. Clearly mark and make sure that you can see: Any grab bars or handrails. First and last steps. Where the edge of each step is. Use tools that help you move around (mobility aids) if they are needed. These include: Canes. Walkers. Scooters. Crutches. Turn on the lights when you go into a dark area. Replace any light bulbs as soon as they burn out. Set up your furniture so you have a clear path. Avoid moving your furniture around. If any of your floors are uneven, fix them. If there are any pets around you, be aware of where they are. Review your medicines with your doctor. Some medicines can make you feel dizzy. This can increase your chance of falling. Ask your doctor what other things that you can do to help prevent falls. This information is not intended to replace advice given to you by your health care provider. Make sure you discuss any questions you have with your health care provider. Document Released: 03/25/2009 Document Revised: 11/04/2015 Document Reviewed: 07/03/2014 Elsevier Interactive Patient Education  2017 [employer].

## 2022-06-26 NOTE — Progress Notes (Signed)
Subjective:   Valerie Coleman is a 62 y.o. female who presents for Medicare Annual (Subsequent) preventive examination. I connected with  Valerie Coleman on 06/26/22 by a audio enabled telemedicine application and verified that I am speaking with the correct person using two identifiers.  Patient Location: Home  Provider Location: Home Office  I discussed the limitations of evaluation and management by telemedicine. The patient expressed understanding and agreed to proceed.  Review of Systems     Cardiac Risk Factors include: advanced age (>32mn, >>49women);hypertension;dyslipidemia     Objective:    Today's Vitals   06/26/22 1038  Weight: 196 lb (88.9 kg)  Height: 6' (1.829 m)   Body mass index is 26.58 kg/m.     06/26/2022   10:45 AM 06/08/2021    4:13 PM 10/05/2020   10:44 AM 05/29/2017   11:12 AM 05/16/2017    9:30 PM 03/20/2016   10:15 AM 02/03/2016    9:27 AM  Advanced Directives  Does Patient Have a Medical Advance Directive? No No No No No No No  Would patient like information on creating a medical advance directive? No - Patient declined No - Patient declined No - Patient declined No - Patient declined No - Patient declined No - patient declined information No - patient declined information    Current Medications (verified) Outpatient Encounter Medications as of 06/26/2022  Medication Sig   amLODipine (NORVASC) 10 MG tablet Take 1 tablet (10 mg total) by mouth daily. (NEEDS TO BE SEEN BEFORE NEXT REFILL)   atorvastatin (LIPITOR) 80 MG tablet Take 1 tablet (80 mg total) by mouth daily at 6 PM.   clopidogrel (PLAVIX) 75 MG tablet Take 1 tablet (75 mg total) by mouth daily.   dapagliflozin propanediol (FARXIGA) 10 MG TABS tablet Take 1 tablet (10 mg total) by mouth daily before breakfast.   ezetimibe (ZETIA) 10 MG tablet Take 1 tablet (10 mg total) by mouth daily.   hydrALAZINE (APRESOLINE) 10 MG tablet Take 1 tablet (10 mg total) by mouth 3 (three) times daily.    hydrochlorothiazide (HYDRODIURIL) 12.5 MG tablet Take 1 tablet (12.5 mg total) by mouth daily.   losartan (COZAAR) 100 MG tablet Take 1 tablet (100 mg total) by mouth daily.   REPATHA SURECLICK 1419MG/ML SOAJ Inject 140 mg into the skin every 14 (fourteen) days.   No facility-administered encounter medications on file as of 06/26/2022.    Allergies (verified) Patient has no known allergies.   History: Past Medical History:  Diagnosis Date   Bilateral lower extremity edema    Blind    CKD (chronic kidney disease)    CVA (cerebral vascular accident) ([facility] 05[place]   Hypertension    Vitamin D deficiency    Past Surgical History:  Procedure Laterality Date   CHOLECYSTECTOMY     EYE SURGERY     TUBAL LIGATION     Family History  Problem Relation Age of Onset   Diabetes Mother    Hypertension Mother    Hypertension Father    Hypertension Sister    Cerebral palsy Sister    Hypertension Daughter    Cancer Paternal Uncle    Lung cancer Brother    Brain cancer Brother    Social History   Socioeconomic History   Marital status: Single    Spouse name: Not on file   Number of children: Not on file   Years of education: Not on file   Highest education level: Not on file  Occupational History   Not on file  Tobacco Use   Smoking status: Former    Types: Cigarettes   Smokeless tobacco: Never  Vaping Use   Vaping Use: Never used  Substance and Sexual Activity   Alcohol use: Not Currently    Alcohol/week: 3.0 standard drinks of alcohol    Types: 2 Cans of beer, 1 Shots of liquor per week    Comment: occ   Drug use: No   Sexual activity: Not on file  Other Topics Concern   Not on file  Social History Narrative   Not on file   Social Determinants of Health   Financial Resource Strain: Low Risk  (06/26/2022)   Overall Financial Resource Strain (CARDIA)    Difficulty of Paying Living Expenses: Not hard at all  Food Insecurity: No Food Insecurity (06/26/2022)   Hunger  Vital Sign    Worried About Running Out of Food in the Last Year: Never true    Ran Out of Food in the Last Year: Never true  Transportation Needs: No Transportation Needs (06/26/2022)   PRAPARE - Hydrologist (Medical): No    Lack of Transportation (Non-Medical): No  Physical Activity: Inactive (06/26/2022)   Exercise Vital Sign    Days of Exercise per Week: 0 days    Minutes of Exercise per Session: 0 min  Stress: No Stress Concern Present (06/26/2022)   [place]    Feeling of Stress : Only a little  Social Connections: Moderately Isolated (06/26/2022)   Social Connection and Isolation Panel [NHANES]    Frequency of Communication with Friends and Family: Twice a week    Frequency of Social Gatherings with Friends and Family: Once a week    Attends Religious Services: 1 to 4 times per year    Active Member of [employer] or Organizations: No    Attends Music therapist: Never    Marital Status: Never married    Tobacco Counseling Counseling given: Not Answered   Clinical Intake:  Pre-visit preparation completed: Yes  Pain : No/denies pain     Nutritional Risks: None Diabetes: No  How often do you need to have someone help you when you read instructions, pamphlets, or other written materials from your doctor or pharmacy?: 1 - Never  Diabetic?no   Interpreter Needed?: No  Information entered by :: Jadene Pierini, LPN   Activities of Daily Living    06/26/2022   10:45 AM  In your present state of health, do you have any difficulty performing the following activities:  Hearing? 0  Vision? 0  Difficulty concentrating or making decisions? 0  Walking or climbing stairs? 0  Dressing or bathing? 0  Doing errands, shopping? 0  Preparing Food and eating ? N  Using the Toilet? N  In the past six months, have you accidently leaked urine? N  Do you have problems with loss  of bowel control? N  Managing your Medications? N  Managing your Finances? N  Housekeeping or managing your Housekeeping? N    Patient Care Team: Gwenlyn Perking, FNP as PCP - General (Family Medicine) Shea Evans Norva Riffle, LCSW as [place] (Licensed Clinical Social Worker) Blanca Friend, Royce Macadamia, [facility] as Pharmacist (Family Medicine)  Indicate any recent [place] you may have received from other than [facility] providers in the past year (date may be approximate).     Assessment:   This is  a routine wellness examination for [place].  Hearing/Vision screen Vision Screening - Comments:: Blind up to date with routine eye exams with  [facility]  Dietary issues and exercise activities discussed: Current Exercise Habits: The patient does not participate in regular exercise at present   Goals Addressed             This Visit's Progress    DIET - EAT MORE FRUITS AND VEGETABLES   On track      Depression Screen    06/26/2022   10:43 AM 06/01/2022    9:07 AM 06/01/2022    8:33 AM 10/05/2021   10:47 AM 08/25/2021   10:31 AM 07/04/2021   10:53 AM 07/04/2021   10:51 AM  PHQ 2/9 Scores  PHQ - 2 Score 0 0 '2 2 2 2 2  '$ PHQ- 9 Score 0 0 '4 7 7 4 4    '$ Fall Risk    06/26/2022   10:39 AM 06/01/2022    8:34 AM 10/05/2021   10:47 AM 06/17/2021    9:28 AM 06/08/2021    3:41 PM  Fall Risk   Falls in the past year? 0 0 0 0 0  Number falls in past yr: 0 0     Injury with Fall? 0 0     Risk for fall due to : No Fall Risks No Fall Risks     Follow up Falls prevention discussed Education provided       FALL RISK PREVENTION PERTAINING TO THE HOME:  Any stairs in or around the home? No  If so, are there any without handrails? No  Home free of loose throw rugs in walkways, pet beds, electrical cords, etc? Yes  Adequate lighting in your home to reduce risk of falls? Yes   ASSISTIVE DEVICES UTILIZED TO PREVENT FALLS:  Life alert? No  Use of a cane, walker  or w/c? Yes  Grab bars in the bathroom? Yes  Shower chair or bench in shower? No  Elevated toilet seat or a handicapped toilet? No        06/26/2022   10:45 AM 06/08/2021    3:52 PM  6CIT Screen  What Year? 0 points 0 points  What month? 0 points 0 points  What time? 0 points 0 points  Count back from 20 0 points 0 points  Months in reverse 0 points 4 points  Repeat phrase 0 points 0 points  Total Score 0 points 4 points    Immunizations  There is no immunization history on file for this patient.  TDAP status: Due, Education has been provided regarding the importance of this vaccine. Advised may receive this vaccine at local pharmacy or Health Dept. Aware to provide a copy of the vaccination record if obtained from local pharmacy or Health Dept. Verbalized acceptance and understanding.  Flu Vaccine status: Declined, Education has been provided regarding the importance of this vaccine but patient still declined. Advised may receive this vaccine at local pharmacy or Health Dept. Aware to provide a copy of the vaccination record if obtained from local pharmacy or Health Dept. Verbalized acceptance and understanding.  Pneumococcal vaccine status: Declined,  Education has been provided regarding the importance of this vaccine but patient still declined. Advised may receive this vaccine at local pharmacy or Health Dept. Aware to provide a copy of the vaccination record if obtained from local pharmacy or Health Dept. Verbalized acceptance and understanding.   Covid-19 vaccine status: Declined, Education has been provided regarding  the importance of this vaccine but patient still declined. Advised may receive this vaccine at local pharmacy or Health Dept.or vaccine clinic. Aware to provide a copy of the vaccination record if obtained from local pharmacy or Health Dept. Verbalized acceptance and understanding.  Qualifies for Shingles Vaccine? Yes   Zostavax completed No   Shingrix Completed?:  No.    Education has been provided regarding the importance of this vaccine. Patient has been advised to call insurance company to determine out of pocket expense if they have not yet received this vaccine. Advised may also receive vaccine at local pharmacy or Health Dept. Verbalized acceptance and understanding.  Screening Tests Health Maintenance  Topic Date Due   COVID-19 Vaccine (1) Never done   DTaP/Tdap/Td (1 - Tdap) Never done   Zoster Vaccines- Shingrix (1 of 2) 08/31/2022 (Originally 07/21/2008)   INFLUENZA VACCINE  09/10/2022 (Originally 01/10/2022)   MAMMOGRAM  10/06/2022 (Originally 07/21/2008)   COLONOSCOPY (Pts 45-17yr Insurance coverage will need to be confirmed)  10/06/2022 (Originally 07/22/2003)   PAP SMEAR-Modifier  04/01/2023   Medicare Annual Wellness (AWV)  06/27/2023   Hepatitis C Screening  Completed   HIV Screening  Completed   HPV VACCINES  Aged Out    Health Maintenance  Health Maintenance Due  Topic Date Due   COVID-19 Vaccine (1) Never done   DTaP/Tdap/Td (1 - Tdap) Never done    Colorectal cancer screening: Referral to GI placed declined . Pt aware the office will call re: appt.  Mammogram status: Ordered declined . Pt provided with contact info and advised to call to schedule appt.   Bone Density status: Ordered not of age . Pt provided with contact info and advised to call to schedule appt.  Lung Cancer Screening: (Low Dose CT Chest recommended if Age 64-80years, 30 pack-year currently smoking OR have quit w/in 15years.) does not qualify.   Lung Cancer Screening Referral: n/a  Additional Screening:  Hepatitis C Screening: does not qualify;   Vision Screening: Recommended annual ophthalmology exams for early detection of glaucoma and other disorders of the eye. Is the patient up to date with their annual eye exam?  Yes  Who is the provider or what is the name of the office in which the patient attends annual eye exams? [facility] If pt is  not established with a provider, would they like to be referred to a provider to establish care? No .   Dental Screening: Recommended annual dental exams for proper oral hygiene  Community Resource Referral / Chronic Care Management: CRR required this visit?  No   CCM required this visit?  No      Plan:     I have personally reviewed and noted the following in the patient's chart:   Medical and social history Use of alcohol, tobacco or illicit drugs  Current medications and supplements including opioid prescriptions. Patient is not currently taking opioid prescriptions. Functional ability and status Nutritional status Physical activity Advanced directives List of other physicians Hospitalizations, surgeries, and ER visits in previous 12 months Vitals Screenings to include cognitive, depression, and falls Referrals and appointments  In addition, I have reviewed and discussed with patient certain preventive protocols, quality metrics, and best practice recommendations. A written personalized care plan for preventive services as well as general preventive health recommendations were provided to patient.     LDaphane Shepherd LPN   [id number]  Nurse Notes: Due all vaccines , Due Colonoscopy /mammogram

## 2022-06-27 ENCOUNTER — Encounter: Payer: Self-pay | Admitting: Family Medicine

## 2022-06-29 ENCOUNTER — Other Ambulatory Visit (HOSPITAL_COMMUNITY): Payer: Self-pay

## 2022-07-03 ENCOUNTER — Telehealth: Payer: Self-pay | Admitting: Family Medicine

## 2022-07-03 NOTE — Telephone Encounter (Signed)
I recommend evaluation ASAP- ER or UC now. She has a history of CVA and severely uncontrolled BP.

## 2022-07-03 NOTE — Telephone Encounter (Signed)
Patient's daughter aware, I have cancelled appointment on Monday 07/10/22 with Tiffany.

## 2022-07-03 NOTE — Telephone Encounter (Signed)
Pts daughter called requesting to speak with PCP or nurse. Says pt was prescribed some new medicine recently and since then she hasn't been acting right.

## 2022-07-03 NOTE — Telephone Encounter (Signed)
Daughter reports since starting "new medicines" that patient is complaining of dizziness  She cannot narrow it down to one medication and has not checked blood pressure to see if this is contributing.  I offered an appointment tomorrow or Wednesday but daughter said she is unable to bring her in until next Monday, 07/10/22.  Appointment scheduled 07/10/22 at 1 pm with you

## 2022-07-10 ENCOUNTER — Ambulatory Visit: Payer: 59 | Admitting: Family Medicine

## 2022-08-28 ENCOUNTER — Other Ambulatory Visit (HOSPITAL_COMMUNITY): Payer: Self-pay

## 2022-09-04 ENCOUNTER — Other Ambulatory Visit: Payer: Self-pay | Admitting: Family Medicine

## 2022-09-04 DIAGNOSIS — I10 Essential (primary) hypertension: Secondary | ICD-10-CM

## 2022-09-04 DIAGNOSIS — Z8673 Personal history of transient ischemic attack (TIA), and cerebral infarction without residual deficits: Secondary | ICD-10-CM

## 2022-09-05 ENCOUNTER — Telehealth: Payer: Self-pay | Admitting: Family Medicine

## 2022-09-05 NOTE — Telephone Encounter (Signed)
Contacted Mischa Carnett to schedule their annual wellness visit. Appointment made for 06/28/2023.  Thank you,  Colletta [place],  [facility] Direct Dial ??[phone]

## 2022-09-16 ENCOUNTER — Other Ambulatory Visit (HOSPITAL_COMMUNITY): Payer: Self-pay

## 2022-11-20 ENCOUNTER — Other Ambulatory Visit: Payer: Self-pay

## 2022-11-20 ENCOUNTER — Other Ambulatory Visit (HOSPITAL_COMMUNITY): Payer: Self-pay

## 2022-11-27 ENCOUNTER — Other Ambulatory Visit: Payer: Self-pay | Admitting: Family Medicine

## 2022-11-27 DIAGNOSIS — I10 Essential (primary) hypertension: Secondary | ICD-10-CM

## 2022-11-27 DIAGNOSIS — Z8673 Personal history of transient ischemic attack (TIA), and cerebral infarction without residual deficits: Secondary | ICD-10-CM

## 2022-12-28 ENCOUNTER — Ambulatory Visit: Payer: 59 | Admitting: Family Medicine

## 2022-12-29 ENCOUNTER — Encounter: Payer: Self-pay | Admitting: Family Medicine

## 2023-01-09 ENCOUNTER — Encounter: Payer: Self-pay | Admitting: Family Medicine

## 2023-01-10 ENCOUNTER — Encounter: Payer: Self-pay | Admitting: Family Medicine

## 2023-01-10 ENCOUNTER — Other Ambulatory Visit: Payer: Self-pay | Admitting: Family Medicine

## 2023-01-10 ENCOUNTER — Ambulatory Visit (INDEPENDENT_AMBULATORY_CARE_PROVIDER_SITE_OTHER): Payer: 59 | Admitting: Family Medicine

## 2023-01-10 VITALS — BP 175/94 | HR 86 | Temp 98.1°F | Ht 72.0 in | Wt 194.4 lb

## 2023-01-10 DIAGNOSIS — E782 Mixed hyperlipidemia: Secondary | ICD-10-CM | POA: Diagnosis not present

## 2023-01-10 DIAGNOSIS — E236 Other disorders of pituitary gland: Secondary | ICD-10-CM

## 2023-01-10 DIAGNOSIS — Z8673 Personal history of transient ischemic attack (TIA), and cerebral infarction without residual deficits: Secondary | ICD-10-CM | POA: Diagnosis not present

## 2023-01-10 DIAGNOSIS — H409 Unspecified glaucoma: Secondary | ICD-10-CM

## 2023-01-10 DIAGNOSIS — H348192 Central retinal vein occlusion, unspecified eye, stable: Secondary | ICD-10-CM

## 2023-01-10 DIAGNOSIS — I10 Essential (primary) hypertension: Secondary | ICD-10-CM

## 2023-01-10 DIAGNOSIS — N1832 Chronic kidney disease, stage 3b: Secondary | ICD-10-CM

## 2023-01-10 DIAGNOSIS — Z91199 Patient's noncompliance with other medical treatment and regimen due to unspecified reason: Secondary | ICD-10-CM

## 2023-01-10 DIAGNOSIS — H548 Legal blindness, as defined in USA: Secondary | ICD-10-CM

## 2023-01-10 NOTE — Progress Notes (Signed)
Established Patient Office Visit  Subjective   Patient ID: Valerie Coleman, female    DOB: 1960-12-06  Age: 62 y.o. MRN: [id number]  Chief Complaint  Patient presents with   Medical Management of Chronic Issues   Hypertension    HPI HTN Complaint with meds - She has been out of losartan and hydrochlorothiazide for at least a week Current Medications -  Pertinent ROS:  Headache - No Fatigue - No Visual Disturbances - legally blind Chest pain - No Dyspnea - No Palpitations - No LE edema - No  She has declined referral to cardiology in the past. She continues to decline this.   2. HLD She has been out of repatha for a few months now. Her neighbor was giving her the injections. She is on atorvastatin 80 mg daily and zetia.   3.  Blindness She has a history of glaucoma and blindness in her left eye, with some slight loss in her right eye. She sees [facility] but has not seen them in a year or so. Left eye has been painful for the last few weeks. Red today on exam. No draiange. Denies nausea or vomiting. No HAs.   4. 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 Seen on MRI in 2018. Also ? neurosarcoidosis. She does report hair growth on her chin and acne on her face.   Past Medical History:  Diagnosis Date   Bilateral lower extremity edema    Blind    CKD (chronic kidney disease)    CVA (cerebral vascular accident) (HCC) 05/2017   Hypertension    Vitamin D deficiency       ROS As per HPI.    Objective:     BP (!) 175/94   Pulse 86   Temp 98.1 F (36.7 C) (Temporal)   Ht 6' (1.829 m)   Wt 194 lb 6 oz (88.2 kg)   SpO2 98%   BMI 26.36 kg/m  BP Readings from Last 3 Encounters:  01/10/23 (!) 175/94  06/01/22 (!) 208/103  10/05/21 (!) 195/108      Physical Exam Vitals and nursing note reviewed.  Constitutional:      General: She is not in acute distress.    Appearance: She is not ill-appearing, toxic-appearing or diaphoretic.  HENT:      Head: Normocephalic and atraumatic.     Comments: Hair growth on chin. Facial acne present.  Eyes:     General: Lids are normal. Visual field deficit present.        Right eye: No discharge or hordeolum.        Left eye: No discharge or hordeolum.     Conjunctiva/sclera:     Right eye: Right conjunctiva is not injected. No exudate.    Left eye: Left conjunctiva is injected. No exudate. Neck:     Thyroid: No thyroid mass, thyromegaly or thyroid tenderness.     Vascular: No carotid bruit.  Cardiovascular:     Rate and Rhythm: Normal rate and regular rhythm.     Heart sounds: Normal heart sounds. No murmur heard. Pulmonary:     Effort: Pulmonary effort is normal. No respiratory distress.     Breath sounds: Normal breath sounds. No wheezing, rhonchi or rales.  Abdominal:     General: Bowel sounds are normal. There is no distension.     Palpations: Abdomen is soft.     Tenderness: There is no abdominal tenderness. There is no guarding or rebound.  Musculoskeletal:     Cervical back: No rigidity.     Right lower leg: No edema.     Left lower leg: No edema.  Skin:    General: Skin is warm and dry.  Neurological:     General: No focal deficit present.     Mental Status: She is alert and oriented to person, place, and time.     Motor: No weakness.     Gait: Gait abnormal (uisng cane).  Psychiatric:        Mood and Affect: Mood normal.        Behavior: Behavior normal.        Thought Content: Thought content normal.        Judgment: Judgment normal.      No results found for any visits on 01/10/23.    The ASCVD Risk score (Arnett DK, et al., 2019) failed to calculate for the following reasons:   The patient has a prior MI or stroke diagnosis    Assessment & Plan:   Sharne was seen today for medical management of chronic issues and hypertension.  Diagnoses and all orders for this visit:  Severe uncontrolled hypertension Uncontrolled. Noncompliant with medication.  Refused referral, further work up today. Discussed risk of uncontrolled HTN. Labs pending. Refills have been sent. Discussed compliance with medications.  -     CBC with Differential/Platelet -     CMP14+EGFR -     TSH  Mixed hyperlipidemia Fasting panel pending. Non compliant with medications.  -     Lipid panel  History of CVA (cerebrovascular accident) Also ? 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 Referral to neuro for further evaluation. Discussed with patient who agrees to referral.  -     Ambulatory referral to Neurology  Enlarged pituitary gland [facility]) Labs pending. Discussed referral to endo. Patient agrees to referral.  -     Prolactin -     Cancel: Ambulatory referral to Endocrinology -     Ambulatory referral to Endocrinology -     Ambulatory referral to Neurology  Stage 3b chronic kidney disease (HCC) On farxiga, ARB. CMP pending.   Central retinal vein occlusion, unspecified complication status, unspecified laterality Glaucoma of both eyes, unspecified glaucoma type Legally blind Left eye with known blinding. Red on exam today with pain for the last few weeks. Urgent referral to ophthalmology discussed and ordered.  -     Ambulatory referral to Ophthalmology   Noncompliance    Return in about 2 weeks (around 01/24/2023) for HTN.   The patient indicates understanding of these issues and agrees with the plan.  Gabriel Earing, FNP

## 2023-01-11 ENCOUNTER — Other Ambulatory Visit: Payer: Self-pay | Admitting: Family Medicine

## 2023-01-11 ENCOUNTER — Ambulatory Visit: Payer: 59 | Admitting: Family Medicine

## 2023-01-11 DIAGNOSIS — Z8673 Personal history of transient ischemic attack (TIA), and cerebral infarction without residual deficits: Secondary | ICD-10-CM

## 2023-01-11 DIAGNOSIS — H348192 Central retinal vein occlusion, unspecified eye, stable: Secondary | ICD-10-CM

## 2023-01-11 DIAGNOSIS — I10 Essential (primary) hypertension: Secondary | ICD-10-CM

## 2023-01-11 DIAGNOSIS — E782 Mixed hyperlipidemia: Secondary | ICD-10-CM

## 2023-01-11 LAB — LIPID PANEL
Chol/HDL Ratio: 6.3 ratio — ABNORMAL HIGH (ref 0.0–4.4)
Cholesterol, Total: 320 mg/dL — ABNORMAL HIGH (ref 100–199)
HDL: 51 mg/dL (ref >39)
LDL Chol Calc (NIH): 207 mg/dL — ABNORMAL HIGH (ref 0–99)
Triglycerides: 307 mg/dL — ABNORMAL HIGH (ref 0–149)
VLDL Cholesterol Cal: 62 mg/dL — ABNORMAL HIGH (ref 5–40)

## 2023-01-11 LAB — CBC WITH DIFFERENTIAL/PLATELET
Basophils Absolute: 0 10*3/uL (ref 0.0–0.2)
Basos: 0 %
EOS (ABSOLUTE): 0.1 10*3/uL (ref 0.0–0.4)
Eos: 2 %
Hematocrit: 43.1 % (ref 34.0–46.6)
Hemoglobin: 14.5 g/dL (ref 11.1–15.9)
Immature Grans (Abs): 0 10*3/uL (ref 0.0–0.1)
Immature Granulocytes: 0 %
Lymphocytes Absolute: 1.8 10*3/uL (ref 0.7–3.1)
Lymphs: 20 %
MCH: 29.5 pg (ref 26.6–33.0)
MCHC: 33.6 g/dL (ref 31.5–35.7)
MCV: 88 fL (ref 79–97)
Monocytes Absolute: 0.6 10*3/uL (ref 0.1–0.9)
Monocytes: 7 %
Neutrophils Absolute: 6.3 10*3/uL (ref 1.4–7.0)
Neutrophils: 71 %
Platelets: 282 10*3/uL (ref 150–450)
RBC: 4.91 x10E6/uL (ref 3.77–5.28)
RDW: 15.1 % (ref 11.7–15.4)
WBC: 8.9 10*3/uL (ref 3.4–10.8)

## 2023-01-11 LAB — CMP14+EGFR
ALT: 15 IU/L (ref 0–32)
AST: 14 IU/L (ref 0–40)
Albumin: 4.4 g/dL (ref 3.9–4.9)
Alkaline Phosphatase: 56 IU/L (ref 44–121)
BUN/Creatinine Ratio: 9 — ABNORMAL LOW (ref 12–28)
BUN: 15 mg/dL (ref 8–27)
Bilirubin Total: 0.3 mg/dL (ref 0.0–1.2)
CO2: 22 mmol/L (ref 20–29)
Calcium: 10.4 mg/dL — ABNORMAL HIGH (ref 8.7–10.3)
Chloride: 103 mmol/L (ref 96–106)
Creatinine, Ser: 1.68 mg/dL — ABNORMAL HIGH (ref 0.57–1.00)
Globulin, Total: 3.1 g/dL (ref 1.5–4.5)
Glucose: 80 mg/dL (ref 70–99)
Potassium: 4.9 mmol/L (ref 3.5–5.2)
Sodium: 138 mmol/L (ref 134–144)
Total Protein: 7.5 g/dL (ref 6.0–8.5)
eGFR: 34 mL/min/{1.73_m2} — ABNORMAL LOW (ref >59)

## 2023-01-11 LAB — TSH: TSH: 3.16 u[IU]/mL (ref 0.450–4.500)

## 2023-01-11 LAB — PROLACTIN: Prolactin: 48 ng/mL — ABNORMAL HIGH (ref 3.6–25.2)

## 2023-01-11 LAB — SPECIMEN STATUS REPORT

## 2023-01-24 ENCOUNTER — Ambulatory Visit: Payer: 59 | Admitting: Family Medicine

## 2023-01-25 ENCOUNTER — Encounter: Payer: Self-pay | Admitting: Family Medicine

## 2023-02-23 ENCOUNTER — Ambulatory Visit: Payer: 59 | Admitting: Neurology

## 2023-04-03 ENCOUNTER — Ambulatory Visit: Payer: 59 | Admitting: Neurology

## 2023-08-02 ENCOUNTER — Other Ambulatory Visit: Payer: Self-pay | Admitting: Family Medicine

## 2023-08-02 DIAGNOSIS — E782 Mixed hyperlipidemia: Secondary | ICD-10-CM

## 2023-08-02 NOTE — Telephone Encounter (Signed)
 Appt scheduled for 08/16/2023

## 2023-08-03 ENCOUNTER — Other Ambulatory Visit: Payer: Self-pay | Admitting: Family Medicine

## 2023-08-03 DIAGNOSIS — Z8673 Personal history of transient ischemic attack (TIA), and cerebral infarction without residual deficits: Secondary | ICD-10-CM

## 2023-08-03 DIAGNOSIS — I10 Essential (primary) hypertension: Secondary | ICD-10-CM

## 2023-08-16 ENCOUNTER — Ambulatory Visit (INDEPENDENT_AMBULATORY_CARE_PROVIDER_SITE_OTHER): Payer: 59 | Admitting: Family Medicine

## 2023-08-16 ENCOUNTER — Encounter: Payer: Self-pay | Admitting: Family Medicine

## 2023-08-16 VITALS — BP 134/72 | HR 93 | Temp 98.2°F | Ht 72.0 in | Wt 201.4 lb

## 2023-08-16 DIAGNOSIS — E782 Mixed hyperlipidemia: Secondary | ICD-10-CM

## 2023-08-16 DIAGNOSIS — E236 Other disorders of pituitary gland: Secondary | ICD-10-CM

## 2023-08-16 DIAGNOSIS — H348192 Central retinal vein occlusion, unspecified eye, stable: Secondary | ICD-10-CM

## 2023-08-16 DIAGNOSIS — Z8673 Personal history of transient ischemic attack (TIA), and cerebral infarction without residual deficits: Secondary | ICD-10-CM | POA: Diagnosis not present

## 2023-08-16 DIAGNOSIS — I10 Essential (primary) hypertension: Secondary | ICD-10-CM | POA: Diagnosis not present

## 2023-08-16 DIAGNOSIS — N1832 Chronic kidney disease, stage 3b: Secondary | ICD-10-CM

## 2023-08-16 MED ORDER — LOSARTAN POTASSIUM 100 MG PO TABS: 100.0000 mg | ORAL_TABLET | Freq: Every day | ORAL | 3 refills | Status: AC

## 2023-08-16 MED ORDER — DAPAGLIFLOZIN PROPANEDIOL 10 MG PO TABS
10.0000 mg | ORAL_TABLET | Freq: Every day | ORAL | 3 refills | Status: AC
Start: 2023-08-16 — End: ?

## 2023-08-16 MED ORDER — CLOPIDOGREL BISULFATE 75 MG PO TABS: 75.0000 mg | ORAL_TABLET | Freq: Every day | ORAL | 3 refills | Status: AC

## 2023-08-16 MED ORDER — HYDROCHLOROTHIAZIDE 12.5 MG PO TABS: 12.5000 mg | ORAL_TABLET | Freq: Every day | ORAL | 3 refills | Status: DC

## 2023-08-16 MED ORDER — AMLODIPINE BESYLATE 10 MG PO TABS: 10.0000 mg | ORAL_TABLET | Freq: Every day | ORAL | 3 refills | Status: AC

## 2023-08-16 MED ORDER — ROSUVASTATIN CALCIUM 20 MG PO TABS
20.0000 mg | ORAL_TABLET | Freq: Every day | ORAL | 3 refills | Status: DC
Start: 2023-08-16 — End: 2023-11-19

## 2023-08-16 MED ORDER — HYDRALAZINE HCL 10 MG PO TABS
10.0000 mg | ORAL_TABLET | Freq: Three times a day (TID) | ORAL | 3 refills | Status: DC
Start: 2023-08-16 — End: 2023-11-19

## 2023-08-16 NOTE — Progress Notes (Signed)
 Established Patient Office Visit  Subjective   Patient ID: Valerie Coleman, female    DOB: [birth year]  Age: 63 y.o. MRN: [id number]  Chief Complaint  Patient presents with   Medical Management of Chronic Issues    HPI HTN Complaint with meds - yes Current Medications - hydrochlorothiazide, hydralazine, losartan Pertinent ROS:  Headache - No Fatigue - No Visual Disturbances - legally blind Chest pain - No Dyspnea - No Palpitations - No LE edema - No  She has declined referral to cardiology in the past. She continues to decline this.   2. HLD Currently only taking zetia. She stopped taking atorvastatin because of itching.   3. 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 Seen on MRI in 2018. Also ? neurosarcoidosis. She does report hair growth on her chin and acne on her face.   Past Medical History:  Diagnosis Date   Bilateral lower extremity edema    Blind    CKD (chronic kidney disease)    CVA (cerebral vascular accident) (HCC) 05/2017   Hypertension    Vitamin D deficiency       ROS As per HPI.    Objective:     BP 134/72   Pulse 93   Temp 98.2 F (36.8 C) (Temporal)   Ht 6' (1.829 m)   Wt 201 lb 6.4 oz (91.4 kg)   SpO2 100%   BMI 27.31 kg/m  BP Readings from Last 3 Encounters:  08/16/23 134/72  01/10/23 (!) 175/94  06/01/22 (!) 208/103      Physical Exam Vitals and nursing note reviewed.  Constitutional:      General: She is not in acute distress.    Appearance: She is not ill-appearing, toxic-appearing or diaphoretic.  HENT:     Head: Normocephalic and atraumatic.     Comments: Hair growth on chin. Facial acne present.  Neck:     Thyroid: No thyroid mass, thyromegaly or thyroid tenderness.     Vascular: No carotid bruit.  Cardiovascular:     Rate and Rhythm: Normal rate and regular rhythm.     Heart sounds: Normal heart sounds. No murmur heard. Pulmonary:     Effort: Pulmonary effort is normal. No respiratory  distress.     Breath sounds: Normal breath sounds. No wheezing, rhonchi or rales.  Abdominal:     General: Bowel sounds are normal. There is no distension.     Palpations: Abdomen is soft.     Tenderness: There is no abdominal tenderness. There is no guarding or rebound.  Musculoskeletal:     Cervical back: No rigidity.     Right lower leg: No edema.     Left lower leg: No edema.  Skin:    General: Skin is warm and dry.  Neurological:     General: No focal deficit present.     Mental Status: She is alert and oriented to person, place, and time.     Motor: No weakness.     Gait: Gait abnormal (uisng cane).  Psychiatric:        Mood and Affect: Mood normal.        Behavior: Behavior normal.        Thought Content: Thought content normal.        Judgment: Judgment normal.     No results found for any visits on 08/16/23.   The ASCVD Risk score (Arnett DK, et al., 2019) failed to calculate for the following reasons:   Risk  score cannot be calculated because patient has a medical history suggesting prior/existing ASCVD    Assessment & Plan:   Tanajah was seen today for medical management of chronic issues.  Diagnoses and all orders for this visit:  Primary hypertension Well controlled on current regimen.  -     amLODipine (NORVASC) 10 MG tablet; Take 1 tablet (10 mg total) by mouth daily. -     hydrALAZINE (APRESOLINE) 10 MG tablet; Take 1 tablet (10 mg total) by mouth 3 (three) times daily. -     hydrochlorothiazide (HYDRODIURIL) 12.5 MG tablet; Take 1 tablet (12.5 mg total) by mouth daily. -     losartan (COZAAR) 100 MG tablet; Take 1 tablet (100 mg total) by mouth daily.  Mixed hyperlipidemia Stopped taking atorvastatin due to itching. Will try crestor as below. Continue zetia. Will repeat fasting panel in 3 months.  -     rosuvastatin (CRESTOR) 20 MG tablet; Take 1 tablet (20 mg total) by mouth daily.  History of CVA (cerebrovascular accident) -     rosuvastatin  (CRESTOR) 20 MG tablet; Take 1 tablet (20 mg total) by mouth daily. -     clopidogrel (PLAVIX) 75 MG tablet; Take 1 tablet (75 mg total) by mouth daily.  Stage 3b chronic kidney disease (HCC) -     dapagliflozin propanediol (FARXIGA) 10 MG TABS tablet; Take 1 tablet (10 mg total) by mouth daily.  Enlarged pituitary gland (HCC) She has declined referral to endo. Patient is aware of elevated prolactin level and still declines referral.   Stable central retinal vein occlusion, unspecified laterality Established with retina specialist.    Return in about 3 months (around 11/16/2023) for chronic follow up.   The patient indicates understanding of these issues and agrees with the plan.  Gabriel Earing, FNP

## 2023-08-16 NOTE — Addendum Note (Signed)
 Addended by: Gabriel Earing on: 08/16/2023 03:01 PM   Modules accepted: Level of Service

## 2023-08-27 ENCOUNTER — Other Ambulatory Visit: Payer: Self-pay | Admitting: Family Medicine

## 2023-08-27 DIAGNOSIS — E782 Mixed hyperlipidemia: Secondary | ICD-10-CM

## 2023-10-11 DIAGNOSIS — Z0279 Encounter for issue of other medical certificate: Secondary | ICD-10-CM

## 2023-10-12 ENCOUNTER — Ambulatory Visit (INDEPENDENT_AMBULATORY_CARE_PROVIDER_SITE_OTHER): Admitting: Family Medicine

## 2023-10-12 ENCOUNTER — Encounter: Payer: Self-pay | Admitting: Family Medicine

## 2023-10-12 VITALS — BP 134/76 | HR 99 | Temp 98.5°F | Ht 72.0 in | Wt 199.0 lb

## 2023-10-12 DIAGNOSIS — N1832 Chronic kidney disease, stage 3b: Secondary | ICD-10-CM | POA: Diagnosis not present

## 2023-10-12 DIAGNOSIS — Z8673 Personal history of transient ischemic attack (TIA), and cerebral infarction without residual deficits: Secondary | ICD-10-CM

## 2023-10-12 DIAGNOSIS — N950 Postmenopausal bleeding: Secondary | ICD-10-CM

## 2023-10-12 DIAGNOSIS — H548 Legal blindness, as defined in USA: Secondary | ICD-10-CM

## 2023-10-12 DIAGNOSIS — I10 Essential (primary) hypertension: Secondary | ICD-10-CM

## 2023-10-12 NOTE — Progress Notes (Unsigned)
   Acute Office Visit  Subjective:     Patient ID: Valerie Coleman, female    DOB: 1961-02-09, 63 y.o.   MRN: [id number]  Chief Complaint  Patient presents with   Medical Management of Chronic Issues    HPI Valerie Coleman is here with her daughter Rosaland Daltone today to discuss FMLA for her daughter.   Rosaland is requesting intermittent FLMA to help with transporting her mom to doctor's appointments, picking up medications, and time to provide care for her mother. Isom Marin helps with cleaning, cooking, and medication management. Valerie Coleman has an inspection monthly to make sure her apartment is clean to management standards.   Rosaland works 6 am- 3 pm six days a week.   Valerie Coleman continues to have vaginal bleeding that is intermittent. She was GYN and had an endometrial biopsy. She did not follow up for results or further management. Rosaland will assist with scheduling this and take Valerie Coleman for a follow up appt.   ROS As per HPI.      Objective:    BP 134/76   Pulse 99   Temp 98.5 F (36.9 C)   Ht 6' (1.829 m)   Wt 199 lb (90.3 kg)   SpO2 100%   BMI 26.99 kg/m    Physical Exam Vitals and nursing note reviewed.  Constitutional:      General: She is not in acute distress.    Appearance: She is not ill-appearing, toxic-appearing or diaphoretic.  Cardiovascular:     Rate and Rhythm: Normal rate and regular rhythm.     Heart sounds: Normal heart sounds. No murmur heard. Pulmonary:     Effort: Pulmonary effort is normal. No respiratory distress.     Breath sounds: Normal breath sounds. No wheezing, rhonchi or rales.  Musculoskeletal:     Cervical back: No rigidity.     Right lower leg: No edema.     Left lower leg: No edema.  Skin:    General: Skin is warm and dry.  Neurological:     General: No focal deficit present.     Mental Status: She is alert and oriented to person, place, and time.     Motor: No weakness.     Gait: Gait abnormal (uisng cane).  Psychiatric:        Mood  and Affect: Mood normal.        Behavior: Behavior normal.     No results found for any visits on 10/12/23.      Assessment & Plan:   Valerie Coleman was seen today for medical management of chronic issues.  Diagnoses and all orders for this visit:  Primary hypertension Well controlled on current regimen.   History of CVA (cerebrovascular accident) On statin.   Legally blind  Stage 3b chronic kidney disease (HCC) Avoid NSAIDs.   Postmenopausal bleeding Discussed need for follow up with GYN.   Keep schedule follow up appt.   The patient indicates understanding of these issues and agrees with the plan.  Albertha Huger, FNP

## 2023-10-15 ENCOUNTER — Telehealth: Payer: Self-pay | Admitting: Family Medicine

## 2023-10-15 NOTE — Telephone Encounter (Signed)
 Called patient to collect the $29 for the form and she is coming by today to pay it and sign it. The form is in the rack in LAT's office.

## 2023-10-16 NOTE — Telephone Encounter (Signed)
 Valerie Coleman dropped off FMLA forms to be completed and signed.  Form Fee Paid? (Y/N)       YES Call Paulette when ready for pick up [phone]     If NO, form is placed on front office manager desk to hold until payment received. If YES, then form will be placed in the RX/HH Nurse Coordinators box for completion.  Form will not be processed until payment is received

## 2023-10-18 NOTE — Addendum Note (Signed)
 Addended by: Albertha Huger on: 10/18/2023 10:24 AM   Modules accepted: Level of Service

## 2023-10-18 NOTE — Telephone Encounter (Signed)
 PCP completed and signed FMLA forms. They have been faxed to [employer] at fax number [phone]. Patient has been contacted and informed they are complete. Copy at front desk.

## 2023-11-19 ENCOUNTER — Encounter: Payer: Self-pay | Admitting: Family Medicine

## 2023-11-19 ENCOUNTER — Ambulatory Visit (INDEPENDENT_AMBULATORY_CARE_PROVIDER_SITE_OTHER): Admitting: Family Medicine

## 2023-11-19 VITALS — BP 123/78 | HR 93 | Temp 98.4°F | Ht 72.0 in | Wt 201.2 lb

## 2023-11-19 DIAGNOSIS — I10 Essential (primary) hypertension: Secondary | ICD-10-CM | POA: Diagnosis not present

## 2023-11-19 DIAGNOSIS — N1832 Chronic kidney disease, stage 3b: Secondary | ICD-10-CM

## 2023-11-19 DIAGNOSIS — E236 Other disorders of pituitary gland: Secondary | ICD-10-CM | POA: Diagnosis not present

## 2023-11-19 DIAGNOSIS — Z8673 Personal history of transient ischemic attack (TIA), and cerebral infarction without residual deficits: Secondary | ICD-10-CM

## 2023-11-19 DIAGNOSIS — H409 Unspecified glaucoma: Secondary | ICD-10-CM

## 2023-11-19 DIAGNOSIS — H548 Legal blindness, as defined in USA: Secondary | ICD-10-CM

## 2023-11-19 DIAGNOSIS — E782 Mixed hyperlipidemia: Secondary | ICD-10-CM

## 2023-11-19 DIAGNOSIS — H348192 Central retinal vein occlusion, unspecified eye, stable: Secondary | ICD-10-CM

## 2023-11-19 MED ORDER — EZETIMIBE 10 MG PO TABS: 10.0000 mg | ORAL_TABLET | Freq: Every day | ORAL | 3 refills | Status: AC

## 2023-11-19 NOTE — Progress Notes (Signed)
 Established Patient Office Visit  Subjective   Patient ID: Valerie Coleman, female    DOB: 1961/02/10  Age: 63 y.o. MRN: [id number]  Chief Complaint  Patient presents with   Medical Management of Chronic Issues    Patient would like for her face to be looked at, patient states she feels like there are a lot of bumps.     HPI  HTN Complaint with meds - yes Current Medications - amlodipine , losartan , hydrochlorothiazide  Pertinent ROS:  Headache - No Fatigue - No Visual Disturbances - legally blind Chest pain - No Dyspnea - No Palpitations - No LE edema - No  She has declined referral to cardiology in the past. She continues to decline this.   2. HLD Currently only taking zetia . She stopped taking statin because of itching.   3. 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 Seen on MRI in 2018. Also ? neurosarcoidosis. She does bumps along her chin. She has declined referral to endo and neuro.   Past Medical History:  Diagnosis Date   Bilateral lower extremity edema    Blind    CKD (chronic kidney disease)    CVA (cerebral vascular accident) (HCC) 05/2017   Hypertension    Vitamin D deficiency       ROS As per HPI.    Objective:     BP 123/78   Pulse 93   Temp 98.4 F (36.9 C)   Ht 6' (1.829 m)   Wt 201 lb 3.2 oz (91.3 kg)   SpO2 98%   BMI 27.29 kg/m  BP Readings from Last 3 Encounters:  11/19/23 123/78  10/12/23 134/76  08/16/23 134/72      Physical Exam Vitals and nursing note reviewed.  Constitutional:      General: She is not in acute distress.    Appearance: She is not ill-appearing, toxic-appearing or diaphoretic.  HENT:     Head: Normocephalic and atraumatic.     Comments: Hair growth on chin. No acne present today.  Neck:     Thyroid : No thyroid  mass, thyromegaly or thyroid  tenderness.     Vascular: No carotid bruit.  Cardiovascular:     Rate and Rhythm: Normal rate and regular rhythm.     Heart sounds: Normal  heart sounds. No murmur heard. Pulmonary:     Effort: Pulmonary effort is normal. No respiratory distress.     Breath sounds: Normal breath sounds. No wheezing, rhonchi or rales.  Abdominal:     General: Bowel sounds are normal. There is no distension.     Palpations: Abdomen is soft.     Tenderness: There is no abdominal tenderness. There is no guarding or rebound.  Musculoskeletal:     Cervical back: No rigidity.     Right lower leg: No edema.     Left lower leg: No edema.  Skin:    General: Skin is warm and dry.  Neurological:     General: No focal deficit present.     Mental Status: She is alert and oriented to person, place, and time.     Motor: No weakness.     Gait: Gait abnormal (uisng cane).  Psychiatric:        Mood and Affect: Mood normal.        Behavior: Behavior normal.        Thought Content: Thought content normal.        Judgment: Judgment normal.     No results found for  any visits on 11/19/23.   The ASCVD Risk score (Arnett DK, et al., 2019) failed to calculate for the following reasons:   Risk score cannot be calculated because patient has a medical history suggesting prior/existing ASCVD    Assessment & Plan:   Derra was seen today for medical management of chronic issues.  Diagnoses and all orders for this visit:  Primary hypertension BP at goal. Declines labs today. Discussed will have to get labs at next appt.   Mixed hyperlipidemia Continue zetia . Declined statin.  -     ezetimibe  (ZETIA ) 10 MG tablet; Take 1 tablet (10 mg total) by mouth daily.  Stage 3b chronic kidney disease (HCC) On farxiga . Will update labs at next appt.   Enlarged pituitary gland (HCC) Hair growth on chin. She again declines referral.   History of CVA (cerebrovascular accident) On zetia . On plavix . Declines statin.   Legally blind Stable central retinal vein occlusion, unspecified laterality Glaucoma of both eyes, unspecified glaucoma type Established with  ophthalmology.    Return in about 3 months (around 02/19/2024) for chronic follow up with fasting labs.   The patient indicates understanding of these issues and agrees with the plan.  Albertha Huger, FNP

## 2023-11-28 ENCOUNTER — Ambulatory Visit

## 2023-11-28 VITALS — BP 123/78 | HR 93 | Ht 73.0 in | Wt 201.0 lb

## 2023-11-28 DIAGNOSIS — Z0000 Encounter for general adult medical examination without abnormal findings: Secondary | ICD-10-CM

## 2023-11-28 NOTE — Progress Notes (Signed)
 Subjective:   Valerie Coleman is a 63 y.o. who presents for a Medicare Wellness preventive visit.  As a reminder, Annual Wellness Visits don't include a physical exam, and some assessments may be limited, especially if this visit is performed virtually. We may recommend an in-person follow-up visit with your provider if needed.  Visit Complete: Virtual I connected with  Valerie Coleman on 11/28/23 by a audio enabled telemedicine application and verified that I am speaking with the correct person using two identifiers.  Patient Location: Home  Provider Location: Home Office  I discussed the limitations of evaluation and management by telemedicine. The patient expressed understanding and agreed to proceed.  Vital Signs: Because this visit was a virtual/telehealth visit, some criteria may be missing or patient reported. Any vitals not documented were not able to be obtained and vitals that have been documented are patient reported.  VideoDeclined- This patient declined Librarian, academic. Therefore the visit was completed with audio only.  Persons Participating in Visit: Patient.  AWV Questionnaire: No: Patient Medicare AWV questionnaire was not completed prior to this visit.  Cardiac Risk Factors include: advanced age (>83men, >79 women);sedentary lifestyle;dyslipidemia;hypertension     Objective:    There were no vitals filed for this visit. There is no height or weight on file to calculate BMI.     11/28/2023    3:31 PM 06/26/2022   10:45 AM 06/08/2021    4:13 PM 10/05/2020   10:44 AM 05/29/2017   11:12 AM 05/16/2017    9:30 PM 03/20/2016   10:15 AM  Advanced Directives  Does Patient Have a Medical Advance Directive? No No No No No  No  No   Would patient like information on creating a medical advance directive?  No - Patient declined No - Patient declined No - Patient declined No - Patient declined  No - Patient declined  No - patient declined  information      Data saved with a previous flowsheet row definition    Current Medications (verified) Outpatient Encounter Medications as of 11/28/2023  Medication Sig   amLODipine  (NORVASC ) 10 MG tablet Take 1 tablet (10 mg total) by mouth daily.   clopidogrel  (PLAVIX ) 75 MG tablet Take 1 tablet (75 mg total) by mouth daily.   dapagliflozin  propanediol (FARXIGA ) 10 MG TABS tablet Take 1 tablet (10 mg total) by mouth daily.   ezetimibe  (ZETIA ) 10 MG tablet Take 1 tablet (10 mg total) by mouth daily.   hydrochlorothiazide  (HYDRODIURIL ) 12.5 MG tablet Take 1 tablet (12.5 mg total) by mouth daily.   losartan  (COZAAR ) 100 MG tablet Take 1 tablet (100 mg total) by mouth daily.   No facility-administered encounter medications on file as of 11/28/2023.    Allergies (verified) Patient has no known allergies.   History: Past Medical History:  Diagnosis Date   Bilateral lower extremity edema    Blind    CKD (chronic kidney disease)    CVA (cerebral vascular accident) (HCC) 05/2017   Hypertension    Vitamin D deficiency    Past Surgical History:  Procedure Laterality Date   CHOLECYSTECTOMY     EYE SURGERY     TUBAL LIGATION     Family History  Problem Relation Age of Onset   Diabetes Mother    Hypertension Mother    Hypertension Father    Hypertension Sister    Cerebral palsy Sister    Hypertension Daughter    Cancer Paternal Uncle    Lung cancer  Brother    Brain cancer Brother    Social History   Socioeconomic History   Marital status: Single    Spouse name: Not on file   Number of children: Not on file   Years of education: Not on file   Highest education level: Not on file  Occupational History   Not on file  Tobacco Use   Smoking status: Former    Types: Cigarettes   Smokeless tobacco: Never  Vaping Use   Vaping status: Never Used  Substance and Sexual Activity   Alcohol use: Not Currently    Alcohol/week: 3.0 standard drinks of alcohol    Types: 2 Cans of  beer, 1 Shots of liquor per week    Comment: occ   Drug use: No   Sexual activity: Not on file  Other Topics Concern   Not on file  Social History Narrative   Not on file   Social Drivers of Health   Financial Resource Strain: Low Risk  (11/28/2023)   Overall Financial Resource Strain (CARDIA)    Difficulty of Paying Living Expenses: Not hard at all  Food Insecurity: Food Insecurity Present (11/28/2023)   Hunger Vital Sign    Worried About Running Out of Food in the Last Year: Sometimes true    Ran Out of Food in the Last Year: Never true  Transportation Needs: No Transportation Needs (11/28/2023)   PRAPARE - Administrator, Civil Service (Medical): No    Lack of Transportation (Non-Medical): No  Physical Activity: Insufficiently Active (11/28/2023)   Exercise Vital Sign    Days of Exercise per Week: 7 days    Minutes of Exercise per Session: 10 min  Stress: No Stress Concern Present (11/28/2023)   [employer] of Occupational Health - Occupational Stress Questionnaire    Feeling of Stress: Only a little  Social Connections: Socially Isolated (11/28/2023)   Social Connection and Isolation Panel    Frequency of Communication with Friends and Family: Twice a week    Frequency of Social Gatherings with Friends and Family: Twice a week    Attends Religious Services: Never    Database administrator or Organizations: No    Attends Engineer, structural: Never    Marital Status: Never married    Tobacco Counseling Counseling given: Yes    Clinical Intake:  Pre-visit preparation completed: Yes  Pain : No/denies pain     Nutritional Risks: None Diabetes: No  Lab Results  Component Value Date   HGBA1C 5.3 05/17/2017   HGBA1C 5.2 10/05/2016     How often do you need to have someone help you when you read instructions, pamphlets, or other written materials from your doctor or pharmacy?: 5 - Always (pt's daughter)  Interpreter Needed?:  No  Information entered by :: Alia t/cma   Activities of Daily Living     11/28/2023    3:27 PM  In your present state of health, do you have any difficulty performing the following activities:  Hearing? 0  Vision? 1  Difficulty concentrating or making decisions? 0  Walking or climbing stairs? 0  Dressing or bathing? 0  Doing errands, shopping? 1  Comment pt use RCAT  Preparing Food and eating ? N  Using the Toilet? N  In the past six months, have you accidently leaked urine? Y  Do you have problems with loss of bowel control? Y  Managing your Medications? Y  Comment pt's daughter  Managing your Finances? Fernande Howells  Comment pt's daughter  Housekeeping or managing your Housekeeping? Y  Comment pt's daughter    Patient Care Team: Albertha Huger, FNP as PCP - General (Family Medicine) Gaile Jourdain Larene Pleasant, LCSW as [facility] Care Management (Licensed Clinical Social Worker) Alpheus Arvin, Donata Fryer, [facility] as Pharmacist (Family Medicine)  I have updated your Care Teams any recent Medical Services you may have received from other providers in the past year.     Assessment:   This is a routine wellness examination for [place].  Hearing/Vision screen Hearing Screening - Comments:: Pt denies hearing dif Vision Screening - Comments:: Legally blind   Goals Addressed             This Visit's Progress    Patient Stated       Per pt would like to move to a different place to live instead of where she is currently living at. A place that will make her happy.        Depression Screen     11/28/2023    3:34 PM 10/12/2023    4:07 PM 10/12/2023    3:34 PM 01/10/2023    9:22 AM 06/26/2022   10:43 AM 06/01/2022    9:07 AM 06/01/2022    8:33 AM  PHQ 2/9 Scores  PHQ - 2 Score 1 1 0 2 0 0 2  PHQ- 9 Score 2 8 2 5  0 0 4    Fall Risk     11/28/2023    3:25 PM 10/12/2023    3:35 PM 01/10/2023    9:22 AM 06/26/2022   10:39 AM 06/01/2022    8:34 AM  Fall Risk   Falls in the past  year? 0 0 0 0 0  Number falls in past yr: 0 0  0 0  Injury with Fall? 0 0  0 0  Risk for fall due to : No Fall Risks No Fall Risks  No Fall Risks No Fall Risks  Follow up Falls evaluation completed Falls evaluation completed  Falls prevention discussed  Education provided      Data saved with a previous flowsheet row definition    MEDICARE RISK AT HOME:  Medicare Risk at Home Any stairs in or around the home?: No If so, are there any without handrails?: No Home free of loose throw rugs in walkways, pet beds, electrical cords, etc?: Yes Adequate lighting in your home to reduce risk of falls?: Yes Life alert?: Yes Use of a cane, walker or w/c?: Yes Grab bars in the bathroom?: No Shower chair or bench in shower?: No Elevated toilet seat or a handicapped toilet?: No  TIMED UP AND GO:  Was the test performed?  no  Cognitive Function: 6CIT completed        11/28/2023    3:36 PM 06/26/2022   10:45 AM 06/08/2021    3:52 PM  6CIT Screen  What Year? 0 points 0 points 0 points  What month? 0 points 0 points 0 points  What time? 0 points 0 points 0 points  Count back from 20 0 points 0 points 0 points  Months in reverse 4 points 0 points 4 points  Repeat phrase 8 points 0 points 0 points  Total Score 12 points 0 points 4 points    Immunizations  There is no immunization history on file for this patient.  Screening Tests Health Maintenance  Topic Date Due   MAMMOGRAM  01/10/2024 (Originally 07/21/2008)   Colonoscopy  01/10/2024 (Originally 07/22/2003)  Zoster Vaccines- Shingrix (1 of 2) 04/11/2024 (Originally 07/21/2008)   Cervical Cancer Screening (HPV/Pap Cotest)  08/15/2024 (Originally 04/01/2023)   DTaP/Tdap/Td (1 - Tdap) 08/15/2024 (Originally 07/21/1977)   Pneumococcal Vaccine: 50+ Years (1 of 2 - PCV) 08/15/2024 (Originally 07/21/1977)   DEXA SCAN  08/15/2024 (Originally 07/22/2023)   COVID-19 Vaccine (1 - 2024-25 season) 08/31/2024 (Originally 02/11/2023)   INFLUENZA VACCINE   01/11/2024   Medicare Annual Wellness (AWV)  11/27/2024   Hepatitis C Screening  Completed   HIV Screening  Completed   HPV VACCINES  Aged Out   Meningococcal B Vaccine  Aged Out    Health Maintenance  There are no preventive care reminders to display for this patient.  Health Maintenance Items Addressed: See Nurse Notes at the end of this note  Additional Screening:  Vision Screening: Recommended annual ophthalmology exams for early detection of glaucoma and other disorders of the eye. Would you like a referral to an eye doctor? No    Dental Screening: Recommended annual dental exams for proper oral hygiene  Community Resource Referral / Chronic Care Management: CRR required this visit?  No   CCM required this visit?  No   Plan:    I have personally reviewed and noted the following in the patient's chart:   Medical and social history Use of alcohol, tobacco or illicit drugs  Current medications and supplements including opioid prescriptions. Patient is not currently taking opioid prescriptions. Functional ability and status Nutritional status Physical activity Advanced directives List of other physicians Hospitalizations, surgeries, and ER visits in previous 12 months Vitals Screenings to include cognitive, depression, and falls Referrals and appointments  In addition, I have reviewed and discussed with patient certain preventive protocols, quality metrics, and best practice recommendations. A written personalized care plan for preventive services as well as general preventive health recommendations were provided to patient.   Michaelle Adolphus, CMA   11/28/2023   After Visit Summary: (Declined) Due to this being a telephonic visit, with patients personalized plan was offered to patient but patient Declined AVS at this time   Notes: Please refer to Routing Comments.

## 2023-11-28 NOTE — Patient Instructions (Signed)
 Valerie Coleman , Thank you for taking time out of your busy schedule to complete your Annual Wellness Visit with me. I enjoyed our conversation and look forward to speaking with you again next year. I, as well as your care team,  appreciate your ongoing commitment to your health goals. Please review the following plan we discussed and let me know if I can assist you in the future. Your Game plan/ To Do List    Follow up Visits: Next Medicare AWV with our clinical staff: 12/01/24 at 2:30p.m.   Next Office Visit with your provider: 02/21/24 at 9:30  Clinician Recommendations:  Aim for 30 minutes of exercise or brisk walking, 6-8 glasses of water, and 5 servings of fruits and vegetables each day.       This is a list of the screening recommended for you and due dates:  Health Maintenance  Topic Date Due   Mammogram  01/10/2024*   Colon Cancer Screening  01/10/2024*   Zoster (Shingles) Vaccine (1 of 2) 04/11/2024*   Pap with HPV screening  08/15/2024*   DTaP/Tdap/Td vaccine (1 - Tdap) 08/15/2024*   Pneumococcal Vaccine for age over 77 (1 of 2 - PCV) 08/15/2024*   DEXA scan (bone density measurement)  08/15/2024*   COVID-19 Vaccine (1 - 2024-25 season) 08/31/2024*   Flu Shot  01/11/2024   Medicare Annual Wellness Visit  11/27/2024   Hepatitis C Screening  Completed   HIV Screening  Completed   HPV Vaccine  Aged Out   Meningitis B Vaccine  Aged Out  *Topic was postponed. The date shown is not the original due date.    Advanced directives: (Declined) Advance directive discussed with you today. Even though you declined this today, please call our office should you change your mind, and we can give you the proper paperwork for you to fill out. Advance Care Planning is important because it:  [x]  Makes sure you receive the medical care that is consistent with your values, goals, and preferences  [x]  It provides guidance to your family and loved ones and reduces their decisional burden about  whether or not they are making the right decisions based on your wishes.  Follow the link provided in your after visit summary or read over the paperwork we have mailed to you to help you started getting your Advance Directives in place. If you need assistance in completing these, please reach out to us  so that we can help you!  See attachments for Preventive Care and Fall Prevention Tips.

## 2024-02-21 ENCOUNTER — Ambulatory Visit: Admitting: Family Medicine

## 2024-02-21 ENCOUNTER — Encounter: Payer: Self-pay | Admitting: Family Medicine

## 2024-02-21 VITALS — BP 140/83 | HR 88 | Temp 97.1°F | Ht 73.0 in | Wt 207.2 lb

## 2024-02-21 DIAGNOSIS — E782 Mixed hyperlipidemia: Secondary | ICD-10-CM

## 2024-02-21 DIAGNOSIS — Z0000 Encounter for general adult medical examination without abnormal findings: Secondary | ICD-10-CM | POA: Diagnosis not present

## 2024-02-21 DIAGNOSIS — Z8673 Personal history of transient ischemic attack (TIA), and cerebral infarction without residual deficits: Secondary | ICD-10-CM

## 2024-02-21 DIAGNOSIS — H348192 Central retinal vein occlusion, unspecified eye, stable: Secondary | ICD-10-CM

## 2024-02-21 DIAGNOSIS — N1832 Chronic kidney disease, stage 3b: Secondary | ICD-10-CM | POA: Diagnosis not present

## 2024-02-21 DIAGNOSIS — I10 Essential (primary) hypertension: Secondary | ICD-10-CM | POA: Diagnosis not present

## 2024-02-21 LAB — LIPID PANEL

## 2024-02-21 MED ORDER — HYDROCHLOROTHIAZIDE 12.5 MG PO TABS: 25.0000 mg | ORAL_TABLET | Freq: Every day | ORAL | 3 refills | Status: DC

## 2024-02-21 NOTE — Progress Notes (Signed)
 Complete physical exam  Patient: Valerie Coleman   DOB: March 01, 1961   63 y.o. Female  MRN: [id number]  Subjective:    Chief Complaint  Patient presents with   Medical Management of Chronic Issues    Valerie Coleman is a 63 y.o. female who presents today for a complete physical exam. She reports consuming a general diet. The patient does not participate in regular exercise at present. She generally feels well. She reports sleeping well. She does have additional problems to discuss today.   Dyspnea on exertion - Occasional shortness of breath, particularly when walking and then stopping - Difficulty catching her breath after exertion - No chest pain - No peripheral edema  Visual disturbance - Stable vision - No ophthalmologic evaluation in approximately three years. States there was nothing that they could do - Concerned about potential blindness - No recent ophthalmologic care  Antihypertensive therapy - Prescribed hydrochlorothiazide  12.5 mg, amlodipine  10 mg, and losartan  100 mg - Hasn't been checking BP at home - Compliant with medications      Most recent fall risk assessment:    11/28/2023    3:25 PM  Fall Risk   Falls in the past year? 0  Number falls in past yr: 0  Injury with Fall? 0  Risk for fall due to : No Fall Risks  Follow up Falls evaluation completed     Most recent depression screenings:    02/21/2024    8:57 AM 11/28/2023    3:34 PM  PHQ 2/9 Scores  PHQ - 2 Score  1  PHQ- 9 Score  2  Exception Documentation Patient refusal     Vision:Not within last year     Patient Care Team: Joesph Annabella HERO, FNP as PCP - General (Family Medicine) Frances, Ozell RAMAN, LCSW as [facility] Care Management (Licensed Clinical Social Worker) Billee Mliss BIRCH, [facility] as Pharmacist (Family Medicine)   Outpatient Medications Prior to Visit  Medication Sig   amLODipine  (NORVASC ) 10 MG tablet Take 1 tablet (10 mg total) by mouth daily.   clopidogrel   (PLAVIX ) 75 MG tablet Take 1 tablet (75 mg total) by mouth daily.   dapagliflozin  propanediol (FARXIGA ) 10 MG TABS tablet Take 1 tablet (10 mg total) by mouth daily.   ezetimibe  (ZETIA ) 10 MG tablet Take 1 tablet (10 mg total) by mouth daily.   losartan  (COZAAR ) 100 MG tablet Take 1 tablet (100 mg total) by mouth daily.   [DISCONTINUED] hydrochlorothiazide  (HYDRODIURIL ) 12.5 MG tablet Take 1 tablet (12.5 mg total) by mouth daily.   No facility-administered medications prior to visit.    ROS Negative unless specially indicated above in HPI.     Objective:     BP (!) 140/83   Pulse 88   Temp (!) 97.1 F (36.2 C) (Temporal)   Ht 6' 1 (1.854 m)   Wt 207 lb 3.2 oz (94 kg)   SpO2 100%   BMI 27.34 kg/m    Physical Exam Vitals and nursing note reviewed.  Constitutional:      General: She is not in acute distress.    Appearance: She is not ill-appearing, toxic-appearing or diaphoretic.  HENT:     Head: Normocephalic.     Right Ear: Tympanic membrane, ear canal and external ear normal.     Left Ear: Tympanic membrane, ear canal and external ear normal.     Nose: Nose normal.     Mouth/Throat:     Mouth: Mucous membranes are moist.  Pharynx: Oropharynx is clear.  Eyes:     General:        Right eye: No discharge.        Left eye: No discharge.     Extraocular Movements: Extraocular movements intact.     Conjunctiva/sclera: Conjunctivae normal.     Comments: Imparied vision. Cataract of left eye  Neck:     Thyroid : No thyroid  mass, thyromegaly or thyroid  tenderness.  Cardiovascular:     Rate and Rhythm: Normal rate and regular rhythm.     Pulses: Normal pulses.     Heart sounds: Normal heart sounds. No murmur heard.    No friction rub. No gallop.  Pulmonary:     Effort: Pulmonary effort is normal.     Breath sounds: Normal breath sounds.  Abdominal:     General: Bowel sounds are normal. There is no distension.     Palpations: Abdomen is soft. There is no mass.      Tenderness: There is no abdominal tenderness. There is no guarding.  Musculoskeletal:     Cervical back: Normal range of motion and neck supple. No tenderness.     Right lower leg: No edema.     Left lower leg: No edema.  Skin:    General: Skin is warm and dry.     Capillary Refill: Capillary refill takes less than 2 seconds.     Findings: No lesion or rash.  Neurological:     Mental Status: She is alert and oriented to person, place, and time. Mental status is at baseline.     Cranial Nerves: No cranial nerve deficit.     Motor: No weakness.     Gait: Gait normal.  Psychiatric:        Mood and Affect: Mood normal.        Behavior: Behavior normal.        Thought Content: Thought content normal.        Judgment: Judgment normal.      No results found for any visits on 02/21/24.     Assessment & Plan:    Routine Health Maintenance and Physical Exam  Valerie Coleman was seen today for annual exam.  Diagnoses and all orders for this visit:  Routine general medical examination at a health care facility  Primary hypertension -     CBC with Differential/Platelet -     CMP14+EGFR -     TSH -     hydrochlorothiazide  (HYDRODIURIL ) 12.5 MG tablet; Take 2 tablets (25 mg total) by mouth daily.  Mixed hyperlipidemia -     Lipid panel  History of CVA (cerebrovascular accident)  Stage 3b chronic kidney disease (HCC) -     VITAMIN D  25 Hydroxy (Vit-D Deficiency, Fractures)  Stable central retinal vein occlusion, unspecified laterality      Primary hypertension Blood pressure slightly elevated. Current regimen includes hydrochlorothiazide , amlodipine , and losartan . No chest pain, some exertional dyspnea, no edema.  - Increase hydrochlorothiazide  to 25 mg daily. - Continue amlodipine  and losartan . - Order lab work. - Follow up in one month to recheck blood pressure.  Dyspnea Lungs clear on exam. She has declined referral or work up.    Mixed hyperlipidemia Declined statin.  Compliant with zetia . Labs pending.    Hx of CVA Declined statin. Compliant with zetia  and plavix .    Chronic kidney disease, stage 3b Continue losartan , farxiga .   Visual impairment Discussed follow up with opthamology.       There is no immunization  history on file for this patient.  Health Maintenance  Topic Date Due   Zoster Vaccines- Shingrix (1 of 2) 04/11/2024 (Originally 07/21/2008)   Cervical Cancer Screening (HPV/Pap Cotest)  08/15/2024 (Originally 04/01/2023)   DTaP/Tdap/Td (1 - Tdap) 08/15/2024 (Originally 07/21/1977)   Pneumococcal Vaccine: 50+ Years (1 of 2 - PCV) 08/15/2024 (Originally 07/21/1977)   DEXA SCAN  08/15/2024 (Originally 07/22/2023)   Influenza Vaccine  09/09/2024 (Originally 01/11/2024)   Mammogram  02/20/2025 (Originally 07/21/1998)   Colonoscopy  02/20/2025 (Originally 07/22/2003)   COVID-19 Vaccine (1 - 2024-25 season) 03/08/2025 (Originally 02/11/2024)   Medicare Annual Wellness (AWV)  11/27/2024   Hepatitis C Screening  Completed   HIV Screening  Completed   Hepatitis B Vaccines 19-59 Average Risk  Aged Out   HPV VACCINES  Aged Out   Meningococcal B Vaccine  Aged Out    Discussed health benefits of physical activity, and encouraged her to engage in regular exercise appropriate for her age and condition.  Problem List Items Addressed This Visit       Cardiovascular and Mediastinum   Central retinal vein occlusion   Relevant Medications   hydrochlorothiazide  (HYDRODIURIL ) 12.5 MG tablet   Primary hypertension   Relevant Medications   hydrochlorothiazide  (HYDRODIURIL ) 12.5 MG tablet   Other Relevant Orders   CBC with Differential/Platelet   CMP14+EGFR   TSH     Genitourinary   Stage 3b chronic kidney disease (HCC)   Relevant Orders   VITAMIN D  25 Hydroxy (Vit-D Deficiency, Fractures)     Other   History of CVA (cerebrovascular accident)   Hyperlipidemia   Relevant Medications   hydrochlorothiazide  (HYDRODIURIL ) 12.5 MG tablet   Other  Relevant Orders   Lipid panel   Other Visit Diagnoses       Routine general medical examination at a health care facility    -  Primary      Return in about 4 weeks (around 03/20/2024) for medication follow up.   The patient indicates understanding of these issues and agrees with the plan.  [name], FNP

## 2024-02-21 NOTE — Patient Instructions (Signed)

## 2024-02-22 ENCOUNTER — Other Ambulatory Visit: Payer: Self-pay

## 2024-02-22 ENCOUNTER — Ambulatory Visit: Payer: Self-pay | Admitting: Family Medicine

## 2024-02-22 DIAGNOSIS — E559 Vitamin D deficiency, unspecified: Secondary | ICD-10-CM

## 2024-02-22 LAB — CBC WITH DIFFERENTIAL/PLATELET
Basophils Absolute: 0 x10E3/uL (ref 0.0–0.2)
Basos: 0 %
EOS (ABSOLUTE): 0.2 x10E3/uL (ref 0.0–0.4)
Eos: 2 %
Hematocrit: 41.8 % (ref 34.0–46.6)
Hemoglobin: 13.4 g/dL (ref 11.1–15.9)
Immature Grans (Abs): 0 x10E3/uL (ref 0.0–0.1)
Immature Granulocytes: 0 %
Lymphocytes Absolute: 2.1 x10E3/uL (ref 0.7–3.1)
Lymphs: 25 %
MCH: 28.8 pg (ref 26.6–33.0)
MCHC: 32.1 g/dL (ref 31.5–35.7)
MCV: 90 fL (ref 79–97)
Monocytes Absolute: 0.7 x10E3/uL (ref 0.1–0.9)
Monocytes: 9 %
Neutrophils Absolute: 5.4 x10E3/uL (ref 1.4–7.0)
Neutrophils: 64 %
Platelets: 247 x10E3/uL (ref 150–450)
RBC: 4.66 x10E6/uL (ref 3.77–5.28)
RDW: 15.6 % — ABNORMAL HIGH (ref 11.7–15.4)
WBC: 8.5 x10E3/uL (ref 3.4–10.8)

## 2024-02-22 LAB — CMP14+EGFR
ALT: 12 IU/L (ref 0–32)
AST: 14 IU/L (ref 0–40)
Albumin: 4.2 g/dL (ref 3.9–4.9)
Alkaline Phosphatase: 52 IU/L (ref 44–121)
BUN/Creatinine Ratio: 10 — AB (ref 12–28)
BUN: 15 mg/dL (ref 8–27)
Bilirubin Total: <0.2 mg/dL (ref 0.0–1.2)
CO2: 16 mmol/L — AB (ref 20–29)
Calcium: 9.1 mg/dL (ref 8.7–10.3)
Chloride: 108 mmol/L — ABNORMAL HIGH (ref 96–106)
Creatinine, Ser: 1.53 mg/dL — AB (ref 0.57–1.00)
Globulin, Total: 2.8 g/dL (ref 1.5–4.5)
Glucose: 94 mg/dL (ref 70–99)
Potassium: 4.9 mmol/L (ref 3.5–5.2)
Sodium: 138 mmol/L (ref 134–144)
Total Protein: 7 g/dL (ref 6.0–8.5)
eGFR: 38 mL/min/1.73 — AB (ref >59)

## 2024-02-22 LAB — LIPID PANEL
Cholesterol, Total: 290 mg/dL — AB (ref 100–199)
HDL: 43 mg/dL (ref >39)
LDL CALC COMMENT:: 6.7 ratio — AB (ref 0.0–4.4)
LDL Chol Calc (NIH): 133 mg/dL — AB (ref 0–99)
Triglycerides: 615 mg/dL — AB (ref 0–149)
VLDL Cholesterol Cal: 114 mg/dL — AB (ref 5–40)

## 2024-02-22 LAB — VITAMIN D 25 HYDROXY (VIT D DEFICIENCY, FRACTURES): Vit D, 25-Hydroxy: 12.8 ng/mL — AB (ref 30.0–100.0)

## 2024-02-22 LAB — TSH: TSH: 3.66 u[IU]/mL (ref 0.450–4.500)

## 2024-02-22 MED ORDER — VITAMIN D (ERGOCALCIFEROL) 1.25 MG (50000 UNIT) PO CAPS
50000.0000 [IU] | ORAL_CAPSULE | ORAL | 0 refills | Status: DC
  Filled 2024-02-22: qty 4, 28d supply, fill #0

## 2024-02-28 ENCOUNTER — Other Ambulatory Visit: Payer: Self-pay

## 2024-03-21 ENCOUNTER — Encounter: Payer: Self-pay | Admitting: Family Medicine

## 2024-03-21 ENCOUNTER — Ambulatory Visit (INDEPENDENT_AMBULATORY_CARE_PROVIDER_SITE_OTHER): Admitting: Family Medicine

## 2024-03-21 DIAGNOSIS — N1832 Chronic kidney disease, stage 3b: Secondary | ICD-10-CM | POA: Diagnosis not present

## 2024-03-21 DIAGNOSIS — I10 Essential (primary) hypertension: Secondary | ICD-10-CM

## 2024-03-21 DIAGNOSIS — H348192 Central retinal vein occlusion, unspecified eye, stable: Secondary | ICD-10-CM

## 2024-03-21 MED ORDER — HYDROCHLOROTHIAZIDE 25 MG PO TABS: 25.0000 mg | ORAL_TABLET | Freq: Every day | ORAL | 3 refills | Status: DC

## 2024-03-21 NOTE — Progress Notes (Signed)
 Established Patient Office Visit  Subjective   Patient ID: Valerie Coleman, female    DOB: [birth year]  Age: 63 y.o. MRN: [id number]  Chief Complaint  Patient presents with   Hypertension    Hypertension    History of Present Illness   Valerie Coleman is a 63 year old female with hypertension who presents for medication adjustment.  Hypertension management - Currently prescribed hydrochlorothiazide  for blood pressure control - Unaware of recent dosage increase to two tablets; has not implemented this change - Receives medications via delivery service on Mondays - Legally blinds, so she is unable to see her prescriptions - Denies chest pain, shortness of breath, edema, dizziness  General well-being - Feeling good overall        ROS As per HPI.    Objective:     BP 133/77   Pulse 93   Temp 97.7 F (36.5 C) (Temporal)   Ht 6' 1 (1.854 m)   Wt 202 lb 9.6 oz (91.9 kg)   SpO2 97%   BMI 26.73 kg/m    Physical Exam Vitals and nursing note reviewed.  Constitutional:      General: She is not in acute distress.    Appearance: She is not ill-appearing, toxic-appearing or diaphoretic.  Cardiovascular:     Rate and Rhythm: Normal rate and regular rhythm.     Heart sounds: Normal heart sounds. No murmur heard. Pulmonary:     Effort: Pulmonary effort is normal. No respiratory distress.     Breath sounds: Normal breath sounds. No wheezing, rhonchi or rales.  Musculoskeletal:     Right lower leg: No edema.     Left lower leg: No edema.  Skin:    General: Skin is warm and dry.  Neurological:     Mental Status: She is alert and oriented to person, place, and time. Mental status is at baseline.  Psychiatric:        Mood and Affect: Mood normal.        Behavior: Behavior normal.      No results found for any visits on 03/21/24.    The ASCVD Risk score (Arnett DK, et al., 2019) failed to calculate for the following reasons:   Risk score cannot be calculated because  patient has a medical history suggesting prior/existing ASCVD    Assessment & Plan:   Valerie Coleman was seen today for hypertension.  Diagnoses and all orders for this visit:  Primary hypertension -     hydrochlorothiazide  (HYDRODIURIL ) 25 MG tablet; Take 1 tablet (25 mg total) by mouth daily.  Stage 3b chronic kidney disease (HCC)  Central retinal vein occlusion, unspecified complication status, unspecified laterality (HCC)      Essential hypertension Improved blood pressure control. Previously instructed to increase hydrochlorothiazide  dosage but was not followed. - Send new prescription for hydrochlorothiazide  at 25 mg dosage - Instruct to take two tablets of current prescription until new bottle arrives. - Advise to discard old bottle once new prescription is received and take one tablet from new bottle. - Place tape on HCTZ 12.5 mg bottle so she could feel which bottle she would need to take 2 of until new delivery and which one to discard with new delivery - Ensure understanding to take both tablets simultaneously.  Chronic kidney disease stage 3b Kidney function well-managed with slight improvement. Emphasized maintaining blood pressure control to prevent further damage and dialysis. - Continue current kidney-protective medications. - Emphasize importance of blood pressure control.  Central retinal vein occulusion  Legally blind She has declined an Engineer, production.       Return in about 3 months (around 06/21/2024) for chronic follow up.   The patient indicates understanding of these issues and agrees with the plan.  Valerie CHRISTELLA Search, FNP

## 2024-04-23 ENCOUNTER — Other Ambulatory Visit (HOSPITAL_COMMUNITY): Payer: Self-pay

## 2024-04-24 ENCOUNTER — Other Ambulatory Visit (HOSPITAL_COMMUNITY): Payer: Self-pay

## 2024-04-26 ENCOUNTER — Other Ambulatory Visit (HOSPITAL_BASED_OUTPATIENT_CLINIC_OR_DEPARTMENT_OTHER): Payer: Self-pay

## 2024-04-28 ENCOUNTER — Ambulatory Visit: Admitting: Family Medicine

## 2024-04-29 ENCOUNTER — Encounter: Payer: Self-pay | Admitting: Family Medicine

## 2024-05-14 ENCOUNTER — Other Ambulatory Visit: Payer: Self-pay

## 2024-05-19 ENCOUNTER — Other Ambulatory Visit: Payer: Self-pay

## 2024-06-23 ENCOUNTER — Ambulatory Visit (INDEPENDENT_AMBULATORY_CARE_PROVIDER_SITE_OTHER): Payer: Self-pay | Admitting: Family Medicine

## 2024-06-23 ENCOUNTER — Encounter: Payer: Self-pay | Admitting: Family Medicine

## 2024-06-23 VITALS — BP 138/80 | HR 89 | Temp 98.3°F | Ht 73.0 in | Wt 211.0 lb

## 2024-06-23 DIAGNOSIS — N1832 Chronic kidney disease, stage 3b: Secondary | ICD-10-CM | POA: Diagnosis not present

## 2024-06-23 DIAGNOSIS — H548 Legal blindness, as defined in USA: Secondary | ICD-10-CM

## 2024-06-23 DIAGNOSIS — E236 Other disorders of pituitary gland: Secondary | ICD-10-CM

## 2024-06-23 DIAGNOSIS — Z8673 Personal history of transient ischemic attack (TIA), and cerebral infarction without residual deficits: Secondary | ICD-10-CM | POA: Diagnosis not present

## 2024-06-23 DIAGNOSIS — I10 Essential (primary) hypertension: Secondary | ICD-10-CM | POA: Diagnosis not present

## 2024-06-23 DIAGNOSIS — H348192 Central retinal vein occlusion, unspecified eye, stable: Secondary | ICD-10-CM

## 2024-06-23 DIAGNOSIS — H409 Unspecified glaucoma: Secondary | ICD-10-CM

## 2024-06-23 DIAGNOSIS — E782 Mixed hyperlipidemia: Secondary | ICD-10-CM | POA: Diagnosis not present

## 2024-06-23 MED ORDER — ATORVASTATIN CALCIUM 40 MG PO TABS: 40.0000 mg | ORAL_TABLET | Freq: Every day | ORAL | 3 refills | Status: AC

## 2024-06-23 NOTE — Progress Notes (Signed)
 "  Established Patient Office Visit  Subjective   Patient ID: Valerie Coleman, female    DOB: Feb 28, 1961  Age: 64 y.o. MRN: [id number]  Chief Complaint  Patient presents with   Medical Management of Chronic Issues    HPI  History of Present Illness   Valerie Coleman is a 64 year old female with hypertension and hyperlipidemia who presents for follow-up of her blood pressure and cholesterol management.  Hypertension management - Currently taking losartan  for blood pressure control - No chest pain, shortness of breath, or peripheral edema - No symptoms suggestive of renal dysfunction  Hyperlipidemia and statin intolerance - Cholesterol levels elevated at last visit, particularly triglycerides; non-fasting sample - Experienced pruritus with Crestor  - Currently taking Zetia  for cholesterol management - Previously used Repatha  - Open to trying another statin for further cholesterol reduction. Previously was opposed - No documented reaction to atorvastatin   Pituitary gland enlargement and associated symptoms - History of pituitary gland enlargement noted on MRI - Associated symptoms include increased facial hair growth and elevated prolactin - Prefers not to pursue further evaluation of this  Cerebrovascular disease and visual impairment - History of multiple infarcts noted of MRI  - Has not seen an eye specialist in approximately four years despite ongoing vision issues. She does plan to schedule follow up.         ROS As per HPI.    Objective:     BP 138/80   Pulse 89   Temp 98.3 F (36.8 C) (Temporal)   Ht 6' 1 (1.854 m)   Wt 211 lb (95.7 kg)   SpO2 97%   BMI 27.84 kg/m  BP Readings from Last 3 Encounters:  06/23/24 138/80  03/21/24 133/77  02/21/24 (!) 140/83      Physical Exam Vitals and nursing note reviewed.  Constitutional:      General: She is not in acute distress.    Appearance: She is not ill-appearing, toxic-appearing or diaphoretic.   Cardiovascular:     Rate and Rhythm: Normal rate and regular rhythm.     Heart sounds: Normal heart sounds. No murmur heard. Pulmonary:     Effort: Pulmonary effort is normal. No respiratory distress.     Breath sounds: Normal breath sounds. No wheezing, rhonchi or rales.  Musculoskeletal:     Right lower leg: No edema.     Left lower leg: No edema.  Skin:    General: Skin is warm and dry.  Neurological:     Mental Status: She is alert and oriented to person, place, and time. Mental status is at baseline.  Psychiatric:        Mood and Affect: Mood normal.        Behavior: Behavior normal.      No results found for any visits on 06/23/24.    The ASCVD Risk score (Arnett DK, et al., 2019) failed to calculate for the following reasons:   Risk score cannot be calculated because patient has a medical history suggesting prior/existing ASCVD   * - Cholesterol units were assumed    Assessment & Plan:   Valerie Coleman was seen today for medical management of chronic issues.  Diagnoses and all orders for this visit:  Primary hypertension  Stage 3b chronic kidney disease (HCC) -     BMP8+EGFR  Mixed hyperlipidemia -     atorvastatin  (LIPITOR ) 40 MG tablet; Take 1 tablet (40 mg total) by mouth daily.  History of CVA (cerebrovascular accident)  Central retinal vein occlusion,  unspecified complication status, unspecified laterality (HCC)  Glaucoma of both eyes, unspecified glaucoma type  Legally blind  Enlarged pituitary gland   Assessment and Plan    Stage 3b chronic kidney disease Kidney function improved. Blood pressure control essential. Losartan  beneficial. Discussed Jardiance or Farxiga  for kidney protection, but she prefers to consider further. - Rechecked kidney function with lab work today. - Continue losartan  for blood pressure and kidney function. - Discuss potential use of Jardiance or Farxiga  for kidney protection at next visit.  Primary hypertension Blood  pressure controlled with amlodipine , hydralazine , and losartan . She self discontinued HCTZ.  - Continue current antihypertensive regimen.  Mixed hyperlipidemia Cholesterol levels high. Previous statin intolerance to crestor . Currently on Zetia . Discussed atorvastatin  to lower cholesterol. Explained risk of plaque buildup and cardiovascular events. - Prescribed atorvastatin . - Sent prescription to [facility]. - Will recheck cholesterol levels at next visit with fasting.  History of cerebrovascular accident Two strokes with residual visual impairment. Emphasized controlling cholesterol and blood pressure to prevent further events. - Continue current management of blood pressure and cholesterol.      Central retinal vein occlusion Glaucoma Legally blind Follow up with ophthalmology.   Pituitary gland enlargement - Declined further evaluation or referral. She will let me know if she reconsiders.    Return in about 3 months (around 09/21/2024) for chronic follow up.   The patient indicates understanding of these issues and agrees with the plan.  Valerie Coleman CHRISTELLA Search, FNP "

## 2024-06-24 LAB — BMP8+EGFR
BUN/Creatinine Ratio: 10 — ABNORMAL LOW (ref 12–28)
BUN: 22 mg/dL (ref 8–27)
CO2: 17 mmol/L — ABNORMAL LOW (ref 20–29)
Calcium: 10.2 mg/dL (ref 8.7–10.3)
Chloride: 106 mmol/L (ref 96–106)
Creatinine, Ser: 2.13 mg/dL — ABNORMAL HIGH (ref 0.57–1.00)
Glucose: 78 mg/dL (ref 70–99)
Potassium: 4.9 mmol/L (ref 3.5–5.2)
Sodium: 137 mmol/L (ref 134–144)
eGFR: 25 mL/min/1.73 — ABNORMAL LOW

## 2024-06-25 ENCOUNTER — Ambulatory Visit: Payer: Self-pay | Admitting: Family Medicine

## 2024-07-07 ENCOUNTER — Other Ambulatory Visit: Payer: Self-pay

## 2024-09-22 ENCOUNTER — Ambulatory Visit: Admitting: Family Medicine

## 2024-12-01 ENCOUNTER — Ambulatory Visit: Payer: Self-pay
